# Patient Record
Sex: Male | Born: 1935 | Hispanic: No
Health system: Southern US, Community
[De-identification: ages and names within clinical notes are randomized; demographics above are authoritative.]

## PROBLEM LIST (undated history)

## (undated) DIAGNOSIS — I10 Essential (primary) hypertension: Secondary | ICD-10-CM

## (undated) DIAGNOSIS — N400 Enlarged prostate without lower urinary tract symptoms: Secondary | ICD-10-CM

## (undated) DIAGNOSIS — E785 Hyperlipidemia, unspecified: Secondary | ICD-10-CM

## (undated) DIAGNOSIS — I4891 Unspecified atrial fibrillation: Secondary | ICD-10-CM

## (undated) DIAGNOSIS — H409 Unspecified glaucoma: Secondary | ICD-10-CM

## (undated) DIAGNOSIS — F32A Depression, unspecified: Secondary | ICD-10-CM

## (undated) DIAGNOSIS — K219 Gastro-esophageal reflux disease without esophagitis: Secondary | ICD-10-CM

## (undated) DIAGNOSIS — I482 Chronic atrial fibrillation, unspecified: Secondary | ICD-10-CM

## (undated) DIAGNOSIS — F0390 Unspecified dementia without behavioral disturbance: Secondary | ICD-10-CM

## (undated) HISTORY — DX: Gastro-esophageal reflux disease without esophagitis: K21.9

## (undated) HISTORY — DX: Essential (primary) hypertension: I10

## (undated) HISTORY — PX: OTHER SURGICAL HISTORY: SHX169

## (undated) HISTORY — DX: Unspecified glaucoma: H40.9

## (undated) HISTORY — DX: Benign prostatic hyperplasia without lower urinary tract symptoms: N40.0

## (undated) HISTORY — DX: Depression, unspecified: F32.A

## (undated) HISTORY — DX: Unspecified atrial fibrillation: I48.91

## (undated) HISTORY — PX: NO PAST SURGERIES: SHX2092

---

## 2020-12-02 ENCOUNTER — Encounter: Payer: Self-pay | Admitting: Internal Medicine

## 2020-12-02 ENCOUNTER — Ambulatory Visit (INDEPENDENT_AMBULATORY_CARE_PROVIDER_SITE_OTHER): Payer: Medicare (Managed Care) | Admitting: Internal Medicine

## 2020-12-02 ENCOUNTER — Other Ambulatory Visit: Payer: Self-pay

## 2020-12-02 VITALS — BP 136/87 | HR 100 | Temp 97.5°F | Ht 65.0 in | Wt 141.8 lb

## 2020-12-02 DIAGNOSIS — Z23 Encounter for immunization: Secondary | ICD-10-CM

## 2020-12-02 DIAGNOSIS — I10 Essential (primary) hypertension: Secondary | ICD-10-CM

## 2020-12-02 DIAGNOSIS — H409 Unspecified glaucoma: Secondary | ICD-10-CM | POA: Insufficient documentation

## 2020-12-02 DIAGNOSIS — J3489 Other specified disorders of nose and nasal sinuses: Secondary | ICD-10-CM | POA: Diagnosis not present

## 2020-12-02 DIAGNOSIS — F329 Major depressive disorder, single episode, unspecified: Secondary | ICD-10-CM

## 2020-12-02 DIAGNOSIS — I4891 Unspecified atrial fibrillation: Secondary | ICD-10-CM | POA: Insufficient documentation

## 2020-12-02 DIAGNOSIS — H66002 Acute suppurative otitis media without spontaneous rupture of ear drum, left ear: Secondary | ICD-10-CM

## 2020-12-02 DIAGNOSIS — N400 Enlarged prostate without lower urinary tract symptoms: Secondary | ICD-10-CM | POA: Insufficient documentation

## 2020-12-02 DIAGNOSIS — E785 Hyperlipidemia, unspecified: Secondary | ICD-10-CM

## 2020-12-02 DIAGNOSIS — K219 Gastro-esophageal reflux disease without esophagitis: Secondary | ICD-10-CM | POA: Insufficient documentation

## 2020-12-02 DIAGNOSIS — F32A Depression, unspecified: Secondary | ICD-10-CM | POA: Insufficient documentation

## 2020-12-02 MED ORDER — SPIRONOLACTONE 25 MG PO TABS: 25.0000 mg | ORAL_TABLET | Freq: Every day | ORAL | 0 refills | Status: DC

## 2020-12-02 MED ORDER — PANTOPRAZOLE SODIUM 40 MG PO TBEC: 40.0000 mg | DELAYED_RELEASE_TABLET | Freq: Every day | ORAL | 0 refills | Status: DC

## 2020-12-02 MED ORDER — DILTIAZEM HCL 30 MG PO TABS: 30.0000 mg | ORAL_TABLET | Freq: Two times a day (BID) | ORAL | 0 refills | Status: AC

## 2020-12-02 MED ORDER — DOXYCYCLINE HYCLATE 100 MG PO TABS: 100.0000 mg | ORAL_TABLET | Freq: Two times a day (BID) | ORAL | 0 refills | Status: DC

## 2020-12-02 MED ORDER — DABIGATRAN ETEXILATE MESYLATE 150 MG PO CAPS: 150.0000 mg | ORAL_CAPSULE | Freq: Two times a day (BID) | ORAL | 0 refills | Status: DC

## 2020-12-02 MED ORDER — FINASTERIDE 5 MG PO TABS: 5.0000 mg | ORAL_TABLET | Freq: Every day | ORAL | 0 refills | Status: AC

## 2020-12-02 MED ORDER — ATORVASTATIN CALCIUM 10 MG PO TABS: 10.0000 mg | ORAL_TABLET | Freq: Every day | ORAL | 0 refills | Status: DC

## 2020-12-02 NOTE — Patient Instructions (Signed)
Continue current medications  We are referring you to an eye doctor, ENT doctor and cardiologist  Take the antibiotics for 1 week   GO TO THE LAB : Get the blood work     GO TO THE FRONT DESK, PLEASE SCHEDULE YOUR APPOINTMENTS Come back for   a checkup in 4 weeks

## 2020-12-02 NOTE — Progress Notes (Signed)
Subjective:    Patient ID: Mark Moore, male    DOB: 1928/09/25, 87 y.o.   MRN: [id number]  DOS:  12/02/2020 Type of visit - description:  New patient, referred by his daughter who is my patient.  Here with his son-in-law.  The patient is 87, lost his wife in September 2021. Since then, he deteriorated rapidly, main concern was decreased mobility and dysphagia. Was admitted twice in [place] where he resided. Eventually was discharged late December to a assisted living facility. The patient moved to [place] 4 days ago and brought to his daughter's house.  One of the main concerns of the family is depression.  Pt was  married for more than 60 years. The patient said today that he in a  way feels better now that is with his daughter and her family.  He also has a left ear infection, chronic, for at least 6 weeks, was seen ENT MD in [place]  Also as soon as the patient arrived, they noted a suppurative lesion at the nose    Review of Systems Currently, no fever chills No nausea, vomiting, diarrhea.  No blood in the stools Denies chest pain, difficulty breathing, palpitation or edema. No short memory problems noted by the family. Appetite has improved, he is sleeping well. No suicidal ideas   Past Medical History:  Diagnosis Date  . Depression   . Glaucoma   . Hypertension     History reviewed. No pertinent surgical history.  Allergies as of 12/02/2020   No Known Allergies     Medication List    as of Dec 02, 2020  1:42 PM   You have not been prescribed any medications.        Objective:   Physical Exam BP 136/87 (BP Location: Left Arm, Patient Position: Sitting, Cuff Size: Large)   Pulse 100   Temp (!) 97.5 F (36.4 C) (Temporal)   Ht 5\' 5"  (1.651 m)   Wt 141 lb 12.8 oz (64.3 kg)   SpO2 98%   BMI 23.60 kg/m  General:   Well developed, NAD, BMI noted.  HEENT:  Normocephalic . Face symmetric, atraumatic Nose: + Swelling, redness and a  punch like lesion at the left side of the nose.  See picture.  I was able to get 1/2 mL of purulent material L ear: Discharge noted. Lungs:  CTA B Normal respiratory effort, no intercostal retractions, no accessory muscle use. Heart: Irregularly irregular.  Abdomen:  Not distended, soft, non-tender. No rebound or rigidity.   Skin: Not pale. Not jaundice Lower extremities: +/+++ pretibial edema bilaterally  Neurologic:  alert & oriented X3.  Speech normal, gait appropriate for age and unassisted Psych--  Cognition and judgment appear intact.  Cooperative with normal attention span and concentration.  Behavior appropriate. No anxious or depressed appearing.       Assessment    Assessment  New patient, referred by daughter   High cholesterol Hypokalemia severe , h/o  BPH GERD Glaucoma Depression (after lost wife 04/2020) HOH  PLAN Social: Patient moved from 05/2020 85 days ago to live with his family, he lost his wife of more than 60 years, September 2021. Since then he apparently had failure to thrive, was admitted to hospital x 2 and subsequently moved to an assisted living facility in October 2021 until few days ago. He presents here for general care. Atrial fibrillation: Unknown to the patient and the family, he has atrial fibrillation, I  noticed that he takes anticoagulants and Cardizem.  Currently asymptomatic.  I did notice some swelling of the lower extremities. EKG: A. fib. Plan: Continue present care, nonurgent referral to cardiology. High cholesterol: On Lipitor Hypokalemia, severe, h/o: reportedly many years ago he "almost died" from hypokalemia and since then is taking Aldactone. Suppurativa lesions: of the Nose, left ear: Doxycycline, refer to ENT. Depression: Due to the recent loss of his wife, denies strongly any suicidal ideas, currently living with his daughter family and feels somewhat better. Denies history of diabetes, stroke or previous  CAD. Medical records: Will attempt to get medical records from [place] Labs: CMP, CBC, TSH, A1c Also, family strongly request PNM shot, PNM 23 provided today. RTC 4 weeks    This visit occurred during the SARS-CoV-2 public health emergency.  Safety protocols were in place, including screening questions prior to the visit, additional usage of staff PPE, and extensive cleaning of exam room while observing appropriate contact time as indicated for disinfecting solutions.

## 2020-12-03 ENCOUNTER — Telehealth: Payer: Self-pay

## 2020-12-03 DIAGNOSIS — E785 Hyperlipidemia, unspecified: Secondary | ICD-10-CM | POA: Insufficient documentation

## 2020-12-03 DIAGNOSIS — Z0279 Encounter for issue of other medical certificate: Secondary | ICD-10-CM

## 2020-12-03 LAB — COMPREHENSIVE METABOLIC PANEL
ALT: 11 U/L (ref 0–53)
AST: 13 U/L (ref 0–37)
Albumin: 4.1 g/dL (ref 3.5–5.2)
Alkaline Phosphatase: 95 U/L (ref 39–117)
BUN: 30 mg/dL — ABNORMAL HIGH (ref 6–23)
CO2: 27 mEq/L (ref 19–32)
Calcium: 9.9 mg/dL (ref 8.4–10.5)
Chloride: 104 mEq/L (ref 96–112)
Creatinine, Ser: 1.03 mg/dL (ref 0.40–1.50)
GFR: 64.45 mL/min (ref >60.00)
Glucose, Bld: 95 mg/dL (ref 70–99)
Potassium: 4.5 mEq/L (ref 3.5–5.1)
Sodium: 140 mEq/L (ref 135–145)
Total Bilirubin: 0.6 mg/dL (ref 0.2–1.2)
Total Protein: 6.9 g/dL (ref 6.0–8.3)

## 2020-12-03 LAB — CBC WITH DIFFERENTIAL/PLATELET
Basophils Absolute: 0.1 10*3/uL (ref 0.0–0.1)
Basophils Relative: 1 % (ref 0.0–3.0)
Eosinophils Absolute: 0.1 10*3/uL (ref 0.0–0.7)
Eosinophils Relative: 1.5 % (ref 0.0–5.0)
HCT: 34.5 % — ABNORMAL LOW (ref 39.0–52.0)
Hemoglobin: 11.6 g/dL — ABNORMAL LOW (ref 13.0–17.0)
Lymphocytes Relative: 19.6 % (ref 12.0–46.0)
Lymphs Abs: 1.7 10*3/uL (ref 0.7–4.0)
MCHC: 33.6 g/dL (ref 30.0–36.0)
MCV: 97.3 fl (ref 78.0–100.0)
Monocytes Absolute: 0.8 10*3/uL (ref 0.1–1.0)
Monocytes Relative: 9.3 % (ref 3.0–12.0)
Neutro Abs: 6.1 10*3/uL (ref 1.4–7.7)
Neutrophils Relative %: 68.6 % (ref 43.0–77.0)
Platelets: 319 10*3/uL (ref 150.0–400.0)
RBC: 3.55 Mil/uL — ABNORMAL LOW (ref 4.22–5.81)
RDW: 14.8 % (ref 11.5–15.5)
WBC: 8.9 10*3/uL (ref 4.0–10.5)

## 2020-12-03 LAB — TSH: TSH: 1.54 u[IU]/mL (ref 0.35–4.50)

## 2020-12-03 LAB — HEMOGLOBIN A1C: Hgb A1c MFr Bld: 5.3 % (ref 4.6–6.5)

## 2020-12-03 MED ORDER — DORZOLAMIDE HCL 2 % OP SOLN: 1.0000 [drp] | Freq: Two times a day (BID) | OPHTHALMIC | 0 refills | Status: DC

## 2020-12-03 MED ORDER — BRIMONIDINE TARTRATE 0.2 % OP SOLN: 1.0000 [drp] | Freq: Three times a day (TID) | OPHTHALMIC | 0 refills | Status: DC

## 2020-12-03 NOTE — Telephone Encounter (Signed)
FL2 form completed over the phone with Pt's daughter, Murlean Caller, they are trying to get him in at [facility] for assisted living. Copy of form sent for scanning and original placed at front desk for pick up by Ana at her convenience.

## 2020-12-03 NOTE — Telephone Encounter (Signed)
FL2 completed. Ana aware it is ready for pick up at front desk.

## 2020-12-29 ENCOUNTER — Telehealth: Payer: Self-pay | Admitting: Internal Medicine

## 2020-12-29 NOTE — Telephone Encounter (Signed)
Caller: Deidre Frances Furbish) Call back # [phone]  Verbal order  PT & OT evaluation  Ok to leave msg

## 2020-12-29 NOTE — Telephone Encounter (Signed)
Spoke w Deidre- verbal orders given. Requesting most recent OV note faxed to them at [phone].

## 2020-12-31 MED ORDER — ESCITALOPRAM OXALATE 5 MG PO TABS
5.0000 mg | ORAL_TABLET | Freq: Every day | ORAL | 1 refills | Status: DC
Start: 2020-12-31 — End: 2021-03-12

## 2021-01-01 ENCOUNTER — Ambulatory Visit: Payer: Medicare (Managed Care) | Admitting: Internal Medicine

## 2021-01-08 ENCOUNTER — Other Ambulatory Visit: Payer: Self-pay | Admitting: Internal Medicine

## 2021-01-17 ENCOUNTER — Encounter: Payer: Self-pay | Admitting: Internal Medicine

## 2021-01-17 ENCOUNTER — Emergency Department (HOSPITAL_COMMUNITY): Payer: Medicare (Managed Care)

## 2021-01-17 ENCOUNTER — Other Ambulatory Visit: Payer: Self-pay

## 2021-01-17 ENCOUNTER — Encounter (HOSPITAL_COMMUNITY): Payer: Self-pay | Admitting: Emergency Medicine

## 2021-01-17 ENCOUNTER — Inpatient Hospital Stay (HOSPITAL_COMMUNITY)
Admission: EM | Admit: 2021-01-17 | Discharge: 2021-02-01 | DRG: 689 | Disposition: A | Discharge status: Skilled Nursing Facility | Payer: Medicare (Managed Care) | Source: Skilled Nursing Facility | Attending: Internal Medicine | Admitting: Internal Medicine

## 2021-01-17 DIAGNOSIS — R4182 Altered mental status, unspecified: Secondary | ICD-10-CM

## 2021-01-17 DIAGNOSIS — W19XXXA Unspecified fall, initial encounter: Secondary | ICD-10-CM

## 2021-01-17 DIAGNOSIS — N390 Urinary tract infection, site not specified: Secondary | ICD-10-CM | POA: Diagnosis not present

## 2021-01-17 DIAGNOSIS — F32A Depression, unspecified: Secondary | ICD-10-CM | POA: Diagnosis present

## 2021-01-17 DIAGNOSIS — S0181XA Laceration without foreign body of other part of head, initial encounter: Secondary | ICD-10-CM | POA: Diagnosis present

## 2021-01-17 DIAGNOSIS — W101XXA Fall (on)(from) sidewalk curb, initial encounter: Secondary | ICD-10-CM | POA: Diagnosis not present

## 2021-01-17 DIAGNOSIS — I482 Chronic atrial fibrillation, unspecified: Secondary | ICD-10-CM | POA: Insufficient documentation

## 2021-01-17 DIAGNOSIS — S22080A Wedge compression fracture of T11-T12 vertebra, initial encounter for closed fracture: Secondary | ICD-10-CM

## 2021-01-17 DIAGNOSIS — M4854XA Collapsed vertebra, not elsewhere classified, thoracic region, initial encounter for fracture: Secondary | ICD-10-CM | POA: Diagnosis present

## 2021-01-17 DIAGNOSIS — G9341 Metabolic encephalopathy: Secondary | ICD-10-CM | POA: Diagnosis present

## 2021-01-17 DIAGNOSIS — Y92480 Sidewalk as the place of occurrence of the external cause: Secondary | ICD-10-CM

## 2021-01-17 DIAGNOSIS — B961 Klebsiella pneumoniae [K. pneumoniae] as the cause of diseases classified elsewhere: Secondary | ICD-10-CM | POA: Diagnosis present

## 2021-01-17 DIAGNOSIS — E785 Hyperlipidemia, unspecified: Secondary | ICD-10-CM | POA: Diagnosis present

## 2021-01-17 DIAGNOSIS — Z20822 Contact with and (suspected) exposure to covid-19: Secondary | ICD-10-CM | POA: Diagnosis present

## 2021-01-17 DIAGNOSIS — S22000A Wedge compression fracture of unspecified thoracic vertebra, initial encounter for closed fracture: Secondary | ICD-10-CM

## 2021-01-17 DIAGNOSIS — Z66 Do not resuscitate: Secondary | ICD-10-CM

## 2021-01-17 DIAGNOSIS — Z1612 Extended spectrum beta lactamase (ESBL) resistance: Secondary | ICD-10-CM | POA: Diagnosis present

## 2021-01-17 DIAGNOSIS — I480 Paroxysmal atrial fibrillation: Secondary | ICD-10-CM | POA: Diagnosis present

## 2021-01-17 DIAGNOSIS — Z23 Encounter for immunization: Secondary | ICD-10-CM

## 2021-01-17 DIAGNOSIS — W010XXA Fall on same level from slipping, tripping and stumbling without subsequent striking against object, initial encounter: Secondary | ICD-10-CM | POA: Diagnosis present

## 2021-01-17 DIAGNOSIS — E876 Hypokalemia: Secondary | ICD-10-CM | POA: Diagnosis present

## 2021-01-17 DIAGNOSIS — G9340 Encephalopathy, unspecified: Secondary | ICD-10-CM | POA: Diagnosis present

## 2021-01-17 DIAGNOSIS — F0390 Unspecified dementia without behavioral disturbance: Secondary | ICD-10-CM | POA: Diagnosis present

## 2021-01-17 DIAGNOSIS — R4189 Other symptoms and signs involving cognitive functions and awareness: Secondary | ICD-10-CM | POA: Insufficient documentation

## 2021-01-17 DIAGNOSIS — H409 Unspecified glaucoma: Secondary | ICD-10-CM | POA: Diagnosis present

## 2021-01-17 DIAGNOSIS — T1490XA Injury, unspecified, initial encounter: Secondary | ICD-10-CM | POA: Diagnosis not present

## 2021-01-17 HISTORY — DX: Do not resuscitate: Z66

## 2021-01-17 HISTORY — DX: Hyperlipidemia, unspecified: E78.5

## 2021-01-17 HISTORY — DX: Chronic atrial fibrillation, unspecified: I48.20

## 2021-01-17 HISTORY — DX: Unspecified dementia, unspecified severity, without behavioral disturbance, psychotic disturbance, mood disturbance, and anxiety: F03.90

## 2021-01-17 HISTORY — DX: Wedge compression fracture of unspecified thoracic vertebra, initial encounter for closed fracture: S22.000A

## 2021-01-17 LAB — COMPREHENSIVE METABOLIC PANEL
ALT: 24 U/L (ref 0–44)
AST: 40 U/L (ref 15–41)
Albumin: 3.9 g/dL (ref 3.5–5.0)
Alkaline Phosphatase: 67 U/L (ref 38–126)
Anion gap: 12 (ref 5–15)
BUN: 48 mg/dL — ABNORMAL HIGH (ref 8–23)
CO2: 20 mmol/L — ABNORMAL LOW (ref 22–32)
Calcium: 9.6 mg/dL (ref 8.9–10.3)
Chloride: 105 mmol/L (ref 98–111)
Creatinine, Ser: 1.44 mg/dL — ABNORMAL HIGH (ref 0.61–1.24)
GFR, Estimated: 46 mL/min — ABNORMAL LOW (ref >60)
Glucose, Bld: 104 mg/dL — ABNORMAL HIGH (ref 70–99)
Potassium: 3.9 mmol/L (ref 3.5–5.1)
Sodium: 137 mmol/L (ref 135–145)
Total Bilirubin: 1 mg/dL (ref 0.3–1.2)
Total Protein: 6.1 g/dL — ABNORMAL LOW (ref 6.5–8.1)

## 2021-01-17 LAB — I-STAT CHEM 8, ED
BUN: 53 mg/dL — ABNORMAL HIGH (ref 8–23)
Calcium, Ion: 1.11 mmol/L — ABNORMAL LOW (ref 1.15–1.40)
Chloride: 106 mmol/L (ref 98–111)
Creatinine, Ser: 1.4 mg/dL — ABNORMAL HIGH (ref 0.61–1.24)
Glucose, Bld: 103 mg/dL — ABNORMAL HIGH (ref 70–99)
HCT: 36 % — ABNORMAL LOW (ref 39.0–52.0)
Hemoglobin: 12.2 g/dL — ABNORMAL LOW (ref 13.0–17.0)
Potassium: 3.7 mmol/L (ref 3.5–5.1)
Sodium: 138 mmol/L (ref 135–145)
TCO2: 21 mmol/L — ABNORMAL LOW (ref 22–32)

## 2021-01-17 LAB — CBC
HCT: 37.2 % — ABNORMAL LOW (ref 39.0–52.0)
Hemoglobin: 12.2 g/dL — ABNORMAL LOW (ref 13.0–17.0)
MCH: 32.4 pg (ref 26.0–34.0)
MCHC: 32.8 g/dL (ref 30.0–36.0)
MCV: 98.9 fL (ref 80.0–100.0)
Platelets: 276 10*3/uL (ref 150–400)
RBC: 3.76 MIL/uL — ABNORMAL LOW (ref 4.22–5.81)
RDW: 15.2 % (ref 11.5–15.5)
WBC: 8.2 10*3/uL (ref 4.0–10.5)
nRBC: 0 % (ref 0.0–0.2)

## 2021-01-17 LAB — URINALYSIS, ROUTINE W REFLEX MICROSCOPIC
Bilirubin Urine: NEGATIVE
Glucose, UA: NEGATIVE mg/dL
Ketones, ur: NEGATIVE mg/dL
Nitrite: NEGATIVE
Protein, ur: NEGATIVE mg/dL
Specific Gravity, Urine: 1.01 (ref 1.005–1.030)
pH: 5.5 (ref 5.0–8.0)

## 2021-01-17 LAB — LACTIC ACID, PLASMA: Lactic Acid, Venous: 3.8 mmol/L (ref 0.5–1.9)

## 2021-01-17 LAB — URINALYSIS, MICROSCOPIC (REFLEX)

## 2021-01-17 LAB — PROTIME-INR
INR: 1.6 — ABNORMAL HIGH (ref 0.8–1.2)
Prothrombin Time: 18.8 seconds — ABNORMAL HIGH (ref 11.4–15.2)

## 2021-01-17 LAB — RESP PANEL BY RT-PCR (FLU A&B, COVID) ARPGX2
Influenza A by PCR: NEGATIVE
Influenza B by PCR: NEGATIVE
SARS Coronavirus 2 by RT PCR: NEGATIVE

## 2021-01-17 LAB — CK: Total CK: 107 U/L (ref 49–397)

## 2021-01-17 LAB — SAMPLE TO BLOOD BANK

## 2021-01-17 LAB — ETHANOL: Alcohol, Ethyl (B): <10 mg/dL (ref <10)

## 2021-01-17 MED ORDER — DILTIAZEM HCL 30 MG PO TABS
30.0000 mg | ORAL_TABLET | Freq: Two times a day (BID) | ORAL | Status: DC
  Administered 2021-01-17 – 2021-02-01 (×30): 30 mg via ORAL
  Filled 2021-01-17 (×30): qty 1

## 2021-01-17 MED ORDER — ACETAMINOPHEN 650 MG RE SUPP: 650.0000 mg | Freq: Four times a day (QID) | RECTAL | Status: DC | PRN

## 2021-01-17 MED ORDER — SODIUM CHLORIDE 0.9 % IV BOLUS
125.0000 mL | Freq: Once | INTRAVENOUS | Status: AC
  Administered 2021-01-17: 125 mL via INTRAVENOUS

## 2021-01-17 MED ORDER — ATORVASTATIN CALCIUM 10 MG PO TABS
10.0000 mg | ORAL_TABLET | Freq: Every day | ORAL | Status: DC
  Administered 2021-01-18 – 2021-02-01 (×15): 10 mg via ORAL
  Filled 2021-01-17 (×15): qty 1

## 2021-01-17 MED ORDER — BRIMONIDINE TARTRATE 0.2 % OP SOLN
1.0000 [drp] | Freq: Three times a day (TID) | OPHTHALMIC | Status: DC
  Administered 2021-01-17 – 2021-02-01 (×42): 1 [drp] via OPHTHALMIC
  Filled 2021-01-17 (×3): qty 5

## 2021-01-17 MED ORDER — DOCUSATE SODIUM 100 MG PO CAPS
100.0000 mg | ORAL_CAPSULE | Freq: Two times a day (BID) | ORAL | Status: DC
  Administered 2021-01-17 – 2021-02-01 (×24): 100 mg via ORAL
  Filled 2021-01-17 (×25): qty 1

## 2021-01-17 MED ORDER — HYDROCODONE-ACETAMINOPHEN 5-325 MG PO TABS: 1.0000 | ORAL_TABLET | ORAL | Status: DC | PRN

## 2021-01-17 MED ORDER — TETANUS-DIPHTH-ACELL PERTUSSIS 5-2.5-18.5 LF-MCG/0.5 IM SUSY
0.5000 mL | PREFILLED_SYRINGE | Freq: Once | INTRAMUSCULAR | Status: AC
  Administered 2021-01-17: 0.5 mL via INTRAMUSCULAR
  Filled 2021-01-17: qty 0.5

## 2021-01-17 MED ORDER — LACTATED RINGERS IV SOLN: INTRAVENOUS | Status: DC

## 2021-01-17 MED ORDER — ACETAMINOPHEN 325 MG PO TABS
650.0000 mg | ORAL_TABLET | Freq: Four times a day (QID) | ORAL | Status: DC | PRN
  Administered 2021-01-19 – 2021-01-26 (×3): 650 mg via ORAL
  Filled 2021-01-17 (×3): qty 2

## 2021-01-17 MED ORDER — MORPHINE SULFATE (PF) 2 MG/ML IV SOLN
2.0000 mg | INTRAVENOUS | Status: DC | PRN
  Administered 2021-01-18: 2 mg via INTRAVENOUS
  Filled 2021-01-17: qty 1

## 2021-01-17 MED ORDER — HALOPERIDOL LACTATE 5 MG/ML IJ SOLN
2.0000 mg | Freq: Four times a day (QID) | INTRAMUSCULAR | Status: DC | PRN
  Administered 2021-01-18 – 2021-01-20 (×2): 2 mg via INTRAVENOUS
  Filled 2021-01-17 (×3): qty 1

## 2021-01-17 MED ORDER — HYDRALAZINE HCL 20 MG/ML IJ SOLN: 5.0000 mg | INTRAMUSCULAR | Status: DC | PRN

## 2021-01-17 MED ORDER — POLYETHYLENE GLYCOL 3350 17 G PO PACK: 17.0000 g | PACK | Freq: Every day | ORAL | Status: DC | PRN

## 2021-01-17 MED ORDER — PANTOPRAZOLE SODIUM 40 MG PO TBEC
40.0000 mg | DELAYED_RELEASE_TABLET | Freq: Every day | ORAL | Status: DC
  Administered 2021-01-18 – 2021-02-01 (×15): 40 mg via ORAL
  Filled 2021-01-17 (×15): qty 1

## 2021-01-17 MED ORDER — BISACODYL 5 MG PO TBEC: 5.0000 mg | DELAYED_RELEASE_TABLET | Freq: Every day | ORAL | Status: DC | PRN

## 2021-01-17 MED ORDER — FINASTERIDE 5 MG PO TABS
5.0000 mg | ORAL_TABLET | Freq: Every day | ORAL | Status: DC
  Administered 2021-01-18 – 2021-02-01 (×15): 5 mg via ORAL
  Filled 2021-01-17 (×15): qty 1

## 2021-01-17 MED ORDER — DABIGATRAN ETEXILATE MESYLATE 150 MG PO CAPS
150.0000 mg | ORAL_CAPSULE | Freq: Two times a day (BID) | ORAL | Status: DC
  Administered 2021-01-17: 150 mg via ORAL
  Filled 2021-01-17 (×2): qty 1

## 2021-01-17 MED ORDER — ONDANSETRON HCL 4 MG/2ML IJ SOLN: 4.0000 mg | Freq: Four times a day (QID) | INTRAMUSCULAR | Status: DC | PRN

## 2021-01-17 MED ORDER — IOHEXOL 300 MG/ML  SOLN
100.0000 mL | Freq: Once | INTRAMUSCULAR | Status: AC | PRN
  Administered 2021-01-17: 100 mL via INTRAVENOUS

## 2021-01-17 MED ORDER — ONDANSETRON HCL 4 MG PO TABS: 4.0000 mg | ORAL_TABLET | Freq: Four times a day (QID) | ORAL | Status: DC | PRN

## 2021-01-17 MED ORDER — DORZOLAMIDE HCL 2 % OP SOLN
1.0000 [drp] | Freq: Two times a day (BID) | OPHTHALMIC | Status: DC
  Administered 2021-01-17 – 2021-02-01 (×31): 1 [drp] via OPHTHALMIC
  Filled 2021-01-17 (×3): qty 10

## 2021-01-17 NOTE — ED Provider Notes (Signed)
[facility] EMERGENCY DEPARTMENT Provider Note   CSN: [id number] Arrival date & time: 01/17/21  4888     History Level 2 trauma  Luc Shammas is a 87 y.o. male.  HPI  Patient presented to the ED for evaluation after being found down lying outside of the nursing facility.  History is somewhat limited but it sounds like the patient is a resident of the assisted living facility portion.  He was found outside the skilled nursing facility.  Patient was only partially closed.  He had evidence of injuries to his head and his extremities.  Patient was confused but also does not speak [place], EMS was not able to provide much of a history.    The daughter was contacted and she was able to provide some additional history.  Patient has been having increasing confusion over this past week.  I thought it could be related to a urinary tract infection but he also started on a new medication Wellbutrin  Patient was spoken to in Spanish by nursing staff.  Patient is confused and not able to provide meaningful history History reviewed. No pertinent past medical history.  There are no problems to display for this patient.   History reviewed. No pertinent surgical history.     History reviewed. No pertinent family history.     Home Medications Prior to Admission medications   Not on File    Allergies    Patient has no allergy information on record.  Review of Systems   Review of Systems  Unable to perform ROS: Acuity of condition   Physical Exam Updated Vital Signs BP 118/70   Pulse 82   Temp 97.6 F (36.4 C) (Oral)   Resp 16   Ht 1.753 m (5\' 9" )   Wt 81.6 kg   SpO2 96%   BMI 26.58 kg/m   Physical Exam Vitals and nursing note reviewed.  Constitutional:      Appearance: Normal appearance. He is well-developed. He is not diaphoretic.  HENT:     Head: Normocephalic. No raccoon eyes or Battle's sign.     Comments: Laceration/abrasion above the right  eye    Right Ear: External ear normal.     Left Ear: External ear normal.  Eyes:     General: Lids are normal.        Right eye: No discharge.     Conjunctiva/sclera:     Right eye: No hemorrhage.    Left eye: No hemorrhage. Neck:     Trachea: No tracheal deviation.  Cardiovascular:     Rate and Rhythm: Normal rate and regular rhythm.     Heart sounds: Normal heart sounds.  Pulmonary:     Effort: Pulmonary effort is normal. No respiratory distress.     Breath sounds: Normal breath sounds. No stridor.  Chest:     Chest wall: No tenderness.  Abdominal:     General: Bowel sounds are normal. There is no distension.     Palpations: Abdomen is soft. There is no mass.     Tenderness: There is no abdominal tenderness.     Comments: Negative for seat belt sign  Musculoskeletal:        General: Tenderness present.     Cervical back: No swelling, edema, deformity or tenderness. No spinous process tenderness.     Thoracic back: No swelling, deformity or tenderness.     Lumbar back: No swelling or tenderness.     Right lower leg: Edema present.  Left lower leg: Edema present.     Comments: Pelvis stable, no ttp; abrasions and contusions to the bilateral knees, tenderness palpation  Neurological:     Mental Status: He is alert.     GCS: GCS eye subscore is 4. GCS verbal subscore is 5. GCS motor subscore is 6.     Motor: No abnormal muscle tone.     Comments: Does not follow commands, awake, answers questions, will need to obtain translator  Psychiatric:        Mood and Affect: Mood normal.        Speech: Speech normal.        Behavior: Behavior normal.    ED Results / Procedures / Treatments   Labs (all labs ordered are listed, but only abnormal results are displayed) Labs Reviewed  COMPREHENSIVE METABOLIC PANEL - Abnormal; Notable for the following components:      Result Value   CO2 20 (*)    Glucose, Bld 104 (*)    BUN 48 (*)    Creatinine, Ser 1.44 (*)    Total Protein  6.1 (*)    GFR, Estimated 46 (*)    All other components within normal limits  CBC - Abnormal; Notable for the following components:   RBC 3.76 (*)    Hemoglobin 12.2 (*)    HCT 37.2 (*)    All other components within normal limits  LACTIC ACID, PLASMA - Abnormal; Notable for the following components:   Lactic Acid, Venous 3.8 (*)    All other components within normal limits  PROTIME-INR - Abnormal; Notable for the following components:   Prothrombin Time 18.8 (*)    INR 1.6 (*)    All other components within normal limits  I-STAT CHEM 8, ED - Abnormal; Notable for the following components:   BUN 53 (*)    Creatinine, Ser 1.40 (*)    Glucose, Bld 103 (*)    Calcium, Ion 1.11 (*)    TCO2 21 (*)    Hemoglobin 12.2 (*)    HCT 36.0 (*)    All other components within normal limits  RESP PANEL BY RT-PCR (FLU A&B, COVID) ARPGX2  ETHANOL  CK  URINALYSIS, ROUTINE W REFLEX MICROSCOPIC  SAMPLE TO BLOOD BANK    EKG None  Radiology DG Knee 2 Views Left  Result Date: 01/17/2021 CLINICAL DATA:  Pain post fall. EXAM: LEFT KNEE - 1-2 VIEW COMPARISON:  None. FINDINGS: No evidence of fracture, dislocation, or joint effusion. No evidence of arthropathy or other focal bone abnormality. Mild suprapatellar soft tissue edema. IMPRESSION: Negative. Electronically Signed   By: Ted Mcalpine M.D.   On: 01/17/2021 11:02   DG Knee 2 Views Right  Result Date: 01/17/2021 CLINICAL DATA:  Patient status post fall. EXAM: RIGHT KNEE - 1-2 VIEW COMPARISON:  None. FINDINGS: Normal anatomic alignment. No evidence for acute fracture or dislocation. Regional soft tissues unremarkable. Vascular calcifications. IMPRESSION: No acute osseous abnormality. Electronically Signed   By: Annia Belt M.D.   On: 01/17/2021 11:03   CT HEAD WO CONTRAST  Result Date: 01/17/2021 CLINICAL DATA:  87 year old male found down. On anticoagulation. EXAM: CT HEAD WITHOUT CONTRAST TECHNIQUE: Contiguous axial images were obtained  from the base of the skull through the vertex without intravenous contrast. COMPARISON:  None. FINDINGS: Brain: No midline shift, ventriculomegaly, mass effect, evidence of mass lesion, intracranial hemorrhage or evidence of cortically based acute infarction. Patchy and confluent fairly symmetric bilateral cerebral white matter hypodensity. Small area of  chronic appearing cortical encephalomalacia also in the anterior right temporal lobe tip (sagittal image 17). Elsewhere preserved gray-white matter differentiation. No other cortical encephalomalacia identified. Vascular: Calcified atherosclerosis at the skull base. No suspicious intracranial vascular hyperdensity. Skull: No fracture identified. Sinuses/Orbits: Left middle ear and mastoids are opacified. And there is chronic right posterior ethmoid and bilateral sphenoid sinus disease with mucoperiosteal thickening. Other sinuses, right tympanic cavity and mastoids are clear. Other: Right forehead scalp hematoma is broad-based, 6 mm in thickness. Visible orbits soft tissues appear to remain normal. No scalp soft tissue gas identified. IMPRESSION: 1. Right forehead scalp hematoma without underlying skull fracture. 2. No acute intracranial abnormality. Moderately advanced bilateral white matter changes most commonly due to small vessel disease. Chronic encephalomalacia at the right temporal lobe tip, might be sequelae of remote trauma. 3. Chronic paranasal sinus disease. Left middle ear and mastoid opacification are likely inflammatory, such as otitis media or cholesteatoma. Electronically Signed   By: Odessa Fleming M.D.   On: 01/17/2021 08:55   CT CERVICAL SPINE WO CONTRAST  Result Date: 01/17/2021 CLINICAL DATA:  87 year old male found down. On anticoagulation. EXAM: CT CERVICAL SPINE WITHOUT CONTRAST TECHNIQUE: Multidetector CT imaging of the cervical spine was performed without intravenous contrast. Multiplanar CT image reconstructions were also generated.  COMPARISON:  Head CT today. FINDINGS: Alignment: Mild straightening of cervical lordosis. Cervicothoracic junction alignment is within normal limits. Bilateral posterior element alignment is within normal limits. Skull base and vertebrae: Visualized skull base is intact. No atlanto-occipital dissociation. Maintained C1-C2 alignment, in those levels appear intact. No acute osseous abnormality identified. Soft tissues and spinal canal: No prevertebral fluid or swelling. No visible canal hematoma. Bulky left carotid calcified atherosclerosis in the neck. Disc levels: Upper cervical facet degeneration, possible developing facet ankylosis on the right at C2-C3. Widespread cervical disc bulging and mild endplate spurring. Suspect mild degenerative spinal stenosis at C4-C5 when combined with ligament flavum hypertrophy. Upper chest: Visible upper thoracic levels appear intact. Negative lung apices. Partially visible calcified aortic arch atherosclerosis. Small volume retained secretions along the left wall of the trachea at the thoracic inlet. Other: Negative visible posterior fossa. IMPRESSION: 1. No acute traumatic injury identified in the cervical spine. 2. Cervical spine degeneration with suspected mild spinal stenosis at C4-C5. 3. Calcified left carotid atherosclerosis in the neck. 4. Aortic Atherosclerosis (ICD10-I70.0). Electronically Signed   By: Odessa Fleming M.D.   On: 01/17/2021 09:22   CT CHEST ABDOMEN PELVIS W CONTRAST  Result Date: 01/17/2021 CLINICAL DATA:  87 year old male found down.  On anticoagulation. EXAM: CT CHEST, ABDOMEN, AND PELVIS WITH CONTRAST TECHNIQUE: Multidetector CT imaging of the chest, abdomen and pelvis was performed following the standard protocol during bolus administration of intravenous contrast. CONTRAST:  OMNIPAQUE IOHEXOL 300 MG/ML  SOLN COMPARISON:  Portable chest x-ray today. FINDINGS: CT CHEST FINDINGS Cardiovascular: Tortuous thoracic aorta with calcified atherosclerosis.  Extensive coronary artery calcified plaque or stents. Major vascular structures in the chest appear intact. Central pulmonary arteries appear patent. Cardiomegaly with no pericardial effusion. Mediastinum/Nodes: Negative. No mediastinal hematoma or lymphadenopathy. Lungs/Pleura: Trace retained secretions in the trachea below the thoracic inlet. Otherwise the major airways are patent. Mild dependent opacity in the lungs, greater on the left, most resembles atelectasis. No pneumothorax or pleural effusion. Superimposed mild right middle lobe lateral segment scarring including a small triangular nodule in the minor fissure which is probably an intrapleural lymph node (series 4, image 43). No pneumothorax. No pleural effusion. No convincing pulmonary contusion. Musculoskeletal:  T12 superior endplate compression fracture appears acute with subchondral lucency. The T12 pedicles and posterior elements appear intact. There is no significant retropulsion. 35% loss of height. Other thoracic vertebrae appear intact. Intact sternum. Visible shoulder osseous structures appear intact. Chronic appearing posterior left 8th and 9th rib fractures. No convincing acute rib fracture. CT ABDOMEN PELVIS FINDINGS Hepatobiliary: Small circumscribed low-density area in the central liver on series 4, image 47 does not appear traumatic and is probably a benign cyst. Elsewhere the liver and gallbladder are within normal limits. Pancreas: Negative. Spleen: The spleen appears intact.  No perisplenic fluid. Adrenals/Urinary Tract: Normal adrenal glands. Symmetric renal enhancement and contrast excretion. Benign left renal parapelvic cyst. Left midpole nephrolithiasis. Decompressed proximal ureters. Unremarkable bladder. Numerous pelvic phleboliths. Stomach/Bowel: Gas and liquid stool in the rectum. Redundant sigmoid colon which tracks into the right upper quadrant. Redundant transverse colon and flexures containing gas. Retained stool in the right  colon. And the cecum is on a lax mesentery in the mid abdomen. No large bowel inflammation. Appendix not delineated. No dilated small bowel. Decompressed stomach and duodenum. No free air or free fluid identified. Vascular/Lymphatic: Aortoiliac soft and calcified atherosclerosis. Tortuous infrarenal abdominal aorta. Major arterial structures remain patent. Portal venous timing is early. On delayed images the portal venous system appears to be patent. No lymphadenopathy. Reproductive: Negative. Other: No pelvic free fluid. Musculoskeletal: L4 superior endplate compression fracture with 35% loss of vertebral body height. L4 pedicles and posterior elements appear intact. No significant retropulsion. This appears age indeterminate. Superimposed grade 1 spondylolisthesis of L4 on L5. Other lumbar levels appear intact. Sacrum, SI joints, pelvis and proximal femurs are intact. IMPRESSION: 1. Acute T12 compression fractures with 35% loss of height. No significant retropulsion or complicating features. 2. Age indeterminate L4 compression fracture with similar loss of height, no retropulsion. Underlying chronic degenerative appearing grade 1 spondylolisthesis at L4-L5. 3. No other acute traumatic injury identified in the chest, abdomen, or pelvis. 4. Cardiomegaly. Extensive coronary artery and Aortic Atherosclerosis (ICD10-I70.0). Left nephrolithiasis. Redundant large bowel. Electronically Signed   By: Odessa Fleming M.D.   On: 01/17/2021 09:08   DG Chest Port 1 View  Result Date: 01/17/2021 CLINICAL DATA:  Male of unknown age status post fall. Found down. On anticoagulation. EXAM: PORTABLE CHEST 1 VIEW COMPARISON:  None. FINDINGS: Portable AP supine view at 0717 hours. Mildly tortuous thoracic aorta with calcified atherosclerosis. Other mediastinal contours are within normal limits. Visualized tracheal air column is within normal limits. Allowing for portable technique the lungs are clear. No pneumothorax or pleural effusion  identified on this supine view. Abundant gas-filled but nondilated bowel loops in the upper abdomen. No displaced rib fracture identified. No acute osseous abnormality identified. IMPRESSION: No acute cardiopulmonary abnormality or acute traumatic injury identified. Electronically Signed   By: Odessa Fleming M.D.   On: 01/17/2021 07:47    Procedures .Marland KitchenLaceration Repair  Date/Time: 01/17/2021 11:23 AM Performed by: Linwood Dibbles, MD Authorized by: Linwood Dibbles, MD   Consent:    Consent obtained:  Emergent situation Anesthesia:    Anesthesia method:  None Laceration details:    Location: Above the right eyebrow.   Length (cm):  1 Treatment:    Area cleansed with:  Shur-Clens   Amount of cleaning:  Standard   Debridement:  None   Undermining:  None   Scar revision: no   Skin repair:    Repair method:  Tissue adhesive Approximation:    Approximation:  Close Repair type:  Repair type:  Simple Post-procedure details:    Dressing:  Open (no dressing)   Procedure completion:  Tolerated well, no immediate complications .Critical Care  Date/Time: 01/17/2021 11:28 AM Performed by: Linwood DibblesKnapp, Cecilia Nishikawa, MD Authorized by: Linwood DibblesKnapp, Icker Swigert, MD   Critical care provider statement:    Critical care time (minutes):  30   Critical care was time spent personally by me on the following activities:  Discussions with consultants, evaluation of patient's response to treatment, examination of patient, ordering and performing treatments and interventions, ordering and review of laboratory studies, ordering and review of radiographic studies, pulse oximetry, re-evaluation of patient's condition, obtaining history from patient or surrogate and review of old charts   Medications Ordered in ED Medications  sodium chloride 0.9 % bolus 125 mL (125 mLs Intravenous New Bag/Given 01/17/21 0813)  Tdap (BOOSTRIX) injection 0.5 mL (0.5 mLs Intramuscular Given 01/17/21 0813)  iohexol (OMNIPAQUE) 300 MG/ML solution 100 mL (100 mLs  Intravenous Contrast Given 01/17/21 0809)    ED Course  I have reviewed the triage vital signs and the nursing notes.  Pertinent labs & imaging results that were available during my care of the patient were reviewed by me and considered in my medical decision making (see chart for details).  Clinical Course as of 01/17/21 1148  Sun Jan 17, 2021  [id number] Head CT without acute findings [JK]  0929 C-spine CT without fracture [JK]  0929 T12 compression fracture, L4 indeterminate age compression fracture noted on chest abdomen pelvis CT [JK]  0929 Labs do show elevated BUN and creatinine. [JK]  0929 Covid and flu are negative [JK]  0930 Chest x-ray without acute findings [JK]  1118 Knee x-rays without acute findings [JK]  1147 D/w Dr Johnsie Cancelstergaard.  No treatment necessary for the t12 fx [JK]    Clinical Course User Index [JK] Linwood DibblesKnapp, Cliffie Gingras, MD   MDM Rules/Calculators/A&P                          Patient presented to the ED for evaluation after being found outside partially dressed at the facility he lives at.  Patient is normally in the assisted living facility section.  Daughter states he has been having some issues with confusion over the past week.  They thought he might of had a UTI and started him on antibiotics but there has not been really any improvement.  Patient also started a new medication as well recently.  Family is concerned that could be contributing to his symptoms.  Does sound like he has some underlying dementia.  Its possible this is progressing.  Patient's ED work-up does not show any acute abnormalities noted on his head CT or C-spine CT.  He does have evidence of a T12 compression fracture on the CT scans.  Otherwise no other acute injuries.  Labs do not show any signs to suggest acute infection or severe dehydration.  Will check urinalysis.  I will consult neurosurgery regarding his T12 fracture to see if he would benefit from a brace.  I will consult with the medical service for  admission and further evaluation of his confusion, cardiac monitoring. Final Clinical Impression(s) / ED Diagnoses Final diagnoses:  Trauma  Facial laceration, initial encounter  Fall, initial encounter  Altered mental status, unspecified altered mental status type  Compression fracture of T12 vertebra, initial encounter (HCC)     Linwood DibblesKnapp, Caleb Decock, MD 01/17/21 1129

## 2021-01-17 NOTE — ED Notes (Signed)
Pt becoming more agitated. Used interpreter to see what patient was saying, and interpreter states patient walking about another room and saying the number eight. Interpreter states pt confused. Pt attempting to climb out of the bed. Pt adjusted back into the bed and new blanket given.

## 2021-01-17 NOTE — ED Notes (Signed)
Provided pt with some water  

## 2021-01-17 NOTE — ED Notes (Signed)
Verbal report received from Crystal B. RN

## 2021-01-17 NOTE — ED Notes (Signed)
Family at the bedside.

## 2021-01-17 NOTE — ED Notes (Signed)
Attempted to I/O cath patient, unsuccessful with multiple providers. Pt's daughter reports hx of prostate enlargement.

## 2021-01-17 NOTE — Progress Notes (Signed)
Orthopedic Tech Progress Note Patient Details:  Mark Moore 08/01/1873 [id number]   Level 2 trauma  Patient ID: Mark Moore, male   DOB: 08/01/1873, 87 y.o.   MRN: [id number]  Docia Furl 01/17/2021, 7:25 AM

## 2021-01-17 NOTE — ED Notes (Signed)
Patient transported to CT 

## 2021-01-17 NOTE — H&P (Signed)
History and Physical    Mark Moore QZR:[id number] DOB: [birth year] DOA: 01/17/2021  PCP: Wanda Plump, MD Consultants:  None Patient coming from:  [facility], moved to [place] from [place] on 11/29/2020; NOK: Daughter, Mark Moore [place], [phone]  Chief Complaint: AMS, fall  HPI: Mark Moore is a 87 y.o. male with medical history significant of afib and HLD presenting with a fall.  His wife died in 05/14/23 and he was hospitalized in January in [place] for FTT.  His family moved him to the [place] in April and he lived with his daughter for a few weeks prior to transitioning to [facility] (not memory care).  He has had some memory issues but not clearly dementia.  His has been more confusion, hallucinations than actual memory issues.  This was worsening and so last week the facility tested his urine (no urinary symptoms, just the confusion) and diagnosed a UTI; he was started on Bactrim.  He also started taking Wellbutrin about 9 days ago due to depression/prolonged grief.  He has been increasingly confused over the last week but it acutely worsened 2 days ago.  Overnight, he was found outside his facility, half-dressed and having fallen.  In the ER, he has been confused with non-sensical speech.    ED Course: Confusion, fall, T12 compression fracture (nothing to do per neurosurgery).  Found outside of ALF, partially dressed, confused.  +paranoia.  Was confused last week with ?UTI - started on abx without improvement.  Also recently started on Wellbutrin for depression.  No apparent infection, lactate likely due to fall.  Review of Systems: Unable to perform  Ambulatory Status:  Ambulates with a cane    Past Medical History:  Diagnosis Date   Atrial fibrillation, chronic (HCC)    Dementia (HCC)    Depression    DNR (do not resuscitate) 01/17/2021   Dyslipidemia     Past Surgical History:  Procedure Laterality Date   none      Social History    Socioeconomic History   Marital status: Unknown    Spouse name: Not on file   Number of children: Not on file   Years of education: Not on file   Highest education level: Not on file  Occupational History   Occupation: retired  Tobacco Use   Smoking status: Never   Smokeless tobacco: Never  Substance and Sexual Activity   Alcohol use: Never   Drug use: Never   Sexual activity: Not on file  Other Topics Concern   Not on file  Social History Narrative   Not on file   Social Determinants of Health   Financial Resource Strain: Not on file  Food Insecurity: Not on file  Transportation Needs: Not on file  Physical Activity: Not on file  Stress: Not on file  Social Connections: Not on file  Intimate Partner Violence: Not on file    Not on File  History reviewed. No pertinent family history.  Prior to Admission medications   Not on File    Physical Exam: Vitals:   01/17/21 1215 01/17/21 1230 01/17/21 1300 01/17/21 1315  BP: 105/75 107/73 116/83 122/75  Pulse: 83 74 78 75  Resp: 12 19 15 18   Temp:      TempSrc:      SpO2: 91% 100% 90% 94%  Weight:      Height:         General:  Appears confused, in a c-collar with R  facial bruising Eyes:   EOMI, normal lids, iris ENT:  Hard of hearing, grossly normal lips & tongue, mmm Neck:  C-collar in place Cardiovascular:  RRR, no m/r/g. No LE edema.  Respiratory:   CTA bilaterally with no wheezes/rales/rhonchi.  Normal respiratory effort. Abdomen:  soft, NT, ND Skin:  Abrasions along R forehead Musculoskeletal:  grossly normal tone BUE/BLE, good ROM, no bony abnormality Psychiatric:  confused mood and affect, speech sparse and inappropriate Neurologic:  unable to perform    Radiological Exams on Admission: Independently reviewed - see discussion in A/P where applicable  DG Knee 2 Views Left  Result Date: 01/17/2021 CLINICAL DATA:  Pain post fall. EXAM: LEFT KNEE - 1-2 VIEW COMPARISON:  None. FINDINGS: No  evidence of fracture, dislocation, or joint effusion. No evidence of arthropathy or other focal bone abnormality. Mild suprapatellar soft tissue edema. IMPRESSION: Negative. Electronically Signed   By: Ted Mcalpine M.D.   On: 01/17/2021 11:02   DG Knee 2 Views Right  Result Date: 01/17/2021 CLINICAL DATA:  Patient status post fall. EXAM: RIGHT KNEE - 1-2 VIEW COMPARISON:  None. FINDINGS: Normal anatomic alignment. No evidence for acute fracture or dislocation. Regional soft tissues unremarkable. Vascular calcifications. IMPRESSION: No acute osseous abnormality. Electronically Signed   By: Annia Belt M.D.   On: 01/17/2021 11:03   CT HEAD WO CONTRAST  Result Date: 01/17/2021 CLINICAL DATA:  87 year old male found down. On anticoagulation. EXAM: CT HEAD WITHOUT CONTRAST TECHNIQUE: Contiguous axial images were obtained from the base of the skull through the vertex without intravenous contrast. COMPARISON:  None. FINDINGS: Brain: No midline shift, ventriculomegaly, mass effect, evidence of mass lesion, intracranial hemorrhage or evidence of cortically based acute infarction. Patchy and confluent fairly symmetric bilateral cerebral white matter hypodensity. Small area of chronic appearing cortical encephalomalacia also in the anterior right temporal lobe tip (sagittal image 17). Elsewhere preserved gray-white matter differentiation. No other cortical encephalomalacia identified. Vascular: Calcified atherosclerosis at the skull base. No suspicious intracranial vascular hyperdensity. Skull: No fracture identified. Sinuses/Orbits: Left middle ear and mastoids are opacified. And there is chronic right posterior ethmoid and bilateral sphenoid sinus disease with mucoperiosteal thickening. Other sinuses, right tympanic cavity and mastoids are clear. Other: Right forehead scalp hematoma is broad-based, 6 mm in thickness. Visible orbits soft tissues appear to remain normal. No scalp soft tissue gas identified.  IMPRESSION: 1. Right forehead scalp hematoma without underlying skull fracture. 2. No acute intracranial abnormality. Moderately advanced bilateral white matter changes most commonly due to small vessel disease. Chronic encephalomalacia at the right temporal lobe tip, might be sequelae of remote trauma. 3. Chronic paranasal sinus disease. Left middle ear and mastoid opacification are likely inflammatory, such as otitis media or cholesteatoma. Electronically Signed   By: Odessa Fleming M.D.   On: 01/17/2021 08:55   CT CERVICAL SPINE WO CONTRAST  Result Date: 01/17/2021 CLINICAL DATA:  87 year old male found down. On anticoagulation. EXAM: CT CERVICAL SPINE WITHOUT CONTRAST TECHNIQUE: Multidetector CT imaging of the cervical spine was performed without intravenous contrast. Multiplanar CT image reconstructions were also generated. COMPARISON:  Head CT today. FINDINGS: Alignment: Mild straightening of cervical lordosis. Cervicothoracic junction alignment is within normal limits. Bilateral posterior element alignment is within normal limits. Skull base and vertebrae: Visualized skull base is intact. No atlanto-occipital dissociation. Maintained C1-C2 alignment, in those levels appear intact. No acute osseous abnormality identified. Soft tissues and spinal canal: No prevertebral fluid or swelling. No visible canal hematoma. Bulky left carotid calcified atherosclerosis in the  neck. Disc levels: Upper cervical facet degeneration, possible developing facet ankylosis on the right at C2-C3. Widespread cervical disc bulging and mild endplate spurring. Suspect mild degenerative spinal stenosis at C4-C5 when combined with ligament flavum hypertrophy. Upper chest: Visible upper thoracic levels appear intact. Negative lung apices. Partially visible calcified aortic arch atherosclerosis. Small volume retained secretions along the left wall of the trachea at the thoracic inlet. Other: Negative visible posterior fossa. IMPRESSION: 1. No  acute traumatic injury identified in the cervical spine. 2. Cervical spine degeneration with suspected mild spinal stenosis at C4-C5. 3. Calcified left carotid atherosclerosis in the neck. 4. Aortic Atherosclerosis (ICD10-I70.0). Electronically Signed   By: Odessa Fleming M.D.   On: 01/17/2021 09:22   CT CHEST ABDOMEN PELVIS W CONTRAST  Result Date: 01/17/2021 CLINICAL DATA:  87 year old male found down.  On anticoagulation. EXAM: CT CHEST, ABDOMEN, AND PELVIS WITH CONTRAST TECHNIQUE: Multidetector CT imaging of the chest, abdomen and pelvis was performed following the standard protocol during bolus administration of intravenous contrast. CONTRAST:  OMNIPAQUE IOHEXOL 300 MG/ML  SOLN COMPARISON:  Portable chest x-ray today. FINDINGS: CT CHEST FINDINGS Cardiovascular: Tortuous thoracic aorta with calcified atherosclerosis. Extensive coronary artery calcified plaque or stents. Major vascular structures in the chest appear intact. Central pulmonary arteries appear patent. Cardiomegaly with no pericardial effusion. Mediastinum/Nodes: Negative. No mediastinal hematoma or lymphadenopathy. Lungs/Pleura: Trace retained secretions in the trachea below the thoracic inlet. Otherwise the major airways are patent. Mild dependent opacity in the lungs, greater on the left, most resembles atelectasis. No pneumothorax or pleural effusion. Superimposed mild right middle lobe lateral segment scarring including a small triangular nodule in the minor fissure which is probably an intrapleural lymph node (series 4, image 43). No pneumothorax. No pleural effusion. No convincing pulmonary contusion. Musculoskeletal: T12 superior endplate compression fracture appears acute with subchondral lucency. The T12 pedicles and posterior elements appear intact. There is no significant retropulsion. 35% loss of height. Other thoracic vertebrae appear intact. Intact sternum. Visible shoulder osseous structures appear intact. Chronic appearing  posterior left 8th and 9th rib fractures. No convincing acute rib fracture. CT ABDOMEN PELVIS FINDINGS Hepatobiliary: Small circumscribed low-density area in the central liver on series 4, image 47 does not appear traumatic and is probably a benign cyst. Elsewhere the liver and gallbladder are within normal limits. Pancreas: Negative. Spleen: The spleen appears intact.  No perisplenic fluid. Adrenals/Urinary Tract: Normal adrenal glands. Symmetric renal enhancement and contrast excretion. Benign left renal parapelvic cyst. Left midpole nephrolithiasis. Decompressed proximal ureters. Unremarkable bladder. Numerous pelvic phleboliths. Stomach/Bowel: Gas and liquid stool in the rectum. Redundant sigmoid colon which tracks into the right upper quadrant. Redundant transverse colon and flexures containing gas. Retained stool in the right colon. And the cecum is on a lax mesentery in the mid abdomen. No large bowel inflammation. Appendix not delineated. No dilated small bowel. Decompressed stomach and duodenum. No free air or free fluid identified. Vascular/Lymphatic: Aortoiliac soft and calcified atherosclerosis. Tortuous infrarenal abdominal aorta. Major arterial structures remain patent. Portal venous timing is early. On delayed images the portal venous system appears to be patent. No lymphadenopathy. Reproductive: Negative. Other: No pelvic free fluid. Musculoskeletal: L4 superior endplate compression fracture with 35% loss of vertebral body height. L4 pedicles and posterior elements appear intact. No significant retropulsion. This appears age indeterminate. Superimposed grade 1 spondylolisthesis of L4 on L5. Other lumbar levels appear intact. Sacrum, SI joints, pelvis and proximal femurs are intact. IMPRESSION: 1. Acute T12 compression fractures with 35% loss of  height. No significant retropulsion or complicating features. 2. Age indeterminate L4 compression fracture with similar loss of height, no retropulsion.  Underlying chronic degenerative appearing grade 1 spondylolisthesis at L4-L5. 3. No other acute traumatic injury identified in the chest, abdomen, or pelvis. 4. Cardiomegaly. Extensive coronary artery and Aortic Atherosclerosis (ICD10-I70.0). Left nephrolithiasis. Redundant large bowel. Electronically Signed   By: Odessa FlemingH  Hall M.D.   On: 01/17/2021 09:08   DG Chest Port 1 View  Result Date: 01/17/2021 CLINICAL DATA:  Male of unknown age status post fall. Found down. On anticoagulation. EXAM: PORTABLE CHEST 1 VIEW COMPARISON:  None. FINDINGS: Portable AP supine view at 0717 hours. Mildly tortuous thoracic aorta with calcified atherosclerosis. Other mediastinal contours are within normal limits. Visualized tracheal air column is within normal limits. Allowing for portable technique the lungs are clear. No pneumothorax or pleural effusion identified on this supine view. Abundant gas-filled but nondilated bowel loops in the upper abdomen. No displaced rib fracture identified. No acute osseous abnormality identified. IMPRESSION: No acute cardiopulmonary abnormality or acute traumatic injury identified. Electronically Signed   By: Odessa FlemingH  Hall M.D.   On: 01/17/2021 07:47    EKG: not done   Labs on Admission: I have personally reviewed the available labs and imaging studies at the time of the admission.  Pertinent labs:   BUN 48/Creatinine 1.44/GFR 46; 30/1.03/>60 on 5/4 Lactate 3.8 WBC 8.2 Hgb 12.2 INR 1.6 COVID/flu negative ETOH <10 UA: moderate Hgb, trace LE, many bacteria   Assessment/Plan Principal Problem:   Fall (on)(from) sidewalk curb, initial encounter Active Problems:   Acute metabolic encephalopathy   Atrial fibrillation, chronic (HCC)   Dementia (HCC)   Depression   Dyslipidemia   Glaucoma   Thoracic compression fracture, closed, initial encounter (HCC)   DNR (do not resuscitate)   Fall -Patient apparently wandered away from his ALF and was found down outside the facility -Cleared  from a trauma standpoint other than thoracic compression fracture (see below) -C-spine is cleared, should be ok for C-collar to be removed (I have asked EDP to take care of this)  Acute metabolic encephalopathy -Patient presenting with encephalopathy as evidenced by his confusion -While the patient does have apparent underlying dementia (not previously diagnosed, but present based on daughter's description), this is a change compared to his usual baseline mental status -Most likely diagnosis is superimposed issues associated with initiation of Wellbutrin; would recommend stopping this medication and monitoring him off to see if cognitive impairment improves -He was also recently diagnosed with UTI; infection could be a cause but he had no other apparent symptoms and treating with Bactrim has not helped.  Will repeat urine culture, but UA is reassuring and so no further treatment is currently ordered. -Based on unremarkable evaluation with current ability to protect his airway, will observe for now with IVF hydration  -50% of patients with delirium while hospitalized will be institutionalized at 6 months, and these patients have a 25% mortality at 6 months -The family would benefit from being referred to the [facility] [employer] [facility] and also provided with the [employer] website   Dementia -Not previously diagnosed, but he has had apparent issues with sundowning while hospitalized in January and with his transition from home in [place] to here (not being able to work the remote control, for example) -Suspect underlying cognitive impairment which is being increasingly unmasked -ALF appears to be insufficient level of care and he likely needs to transition to memory care  Afib -Rate controlled  with Diltiazem -Pradaxa for [facility]  Depression -He was previously on Lexapro at PCP f/u -Most recently taking Wellbutrin (in addition?) -Will hold both for now and monitor -Has a sitter due to  agitation  HLD -Continue Lipitor  Glaucoma -Continue Alphagan, Trusopt  T-spine compression fracture -Per EDP discussion, neurosurgery (Dr. Maurice Small) did not recommend intervention -There appears to be no limitation in movement and TLSO is not recommended  DNR -I have discussed code status with the patient's daughter; he would not desire resuscitation and would prefer to die a natural death should that situation arise. -He will need a gold out of facility DNR form at the time of discharge      Note: This patient has been tested and is negative for the novel coronavirus COVID-19.   Level of care: Med-Surg DVT prophylaxis:  Lovenox  Code Status:  DNR - confirmed with family Family Communication: Daughter was present throughout evaluation Disposition Plan:  The patient is from: ALF  Anticipated d/c is to: ALF - Memory care?  Anticipated d/c date will depend on clinical response to treatment, but possibly as early as tomorrow if she has excellent response to treatment  Patient is currently: acutely ill Consults called: PT/OT/ST/Toc team  Admission status:  It is my clinical opinion that referral for OBSERVATION is reasonable and necessary in this patient based on the above information provided. The aforementioned taken together are felt to place the patient at high risk for further clinical deterioration. However it is anticipated that the patient may be medically stable for discharge from the hospital within 24 to 48 hours.    Jonah Blue MD [facility]   How to contact the [facility] Attending or Consulting provider 7A - 7P or covering provider during after hours 7P -7A, for this patient?  Check the care team in [facility] and look for a) attending/consulting TRH provider listed and b) the [facility] team listed Log into [email or web address] and use [place]'s universal password to access. If you do not have the password, please contact the hospital operator. Locate the [facility] provider you are  looking for under [facility] and page to a number that you can be directly reached. If you still have difficulty reaching the provider, please page the [facility] (Director on Call) for the Hospitalists listed on amion for assistance.   01/17/2021, 3:32 PM

## 2021-01-17 NOTE — ED Notes (Addendum)
Pt found outside of [name] [place] [address] face down on sidewalk, unknown downtime or baseline. Spanish speaking. Unknown if patient lives at this facility, no ID found on patient at this time. Ambulating outside with bag of various photos and keys, cell phone, no pants on. Skin tears to bilateral knees, right shoulder abrasion, laceration and skin tear to right eye. ccollar in place.

## 2021-01-17 NOTE — ED Notes (Signed)
Attempted to call report while pt had a ready bed while on the phone bed assignment changed to a not ready room. While attempting to give report advised room hadnt been assigned at this time. Provided number for someone to call back. My charge nurse called and made aware

## 2021-01-17 NOTE — TOC Initial Note (Signed)
Transition of Care [facility]) - Initial/Assessment Note    Patient Details  Name: Mark Moore MRN: [id number] Date of Birth: [birth year]  Transition of [facility] CM/SW Contact:    Lockie Pares, RN Phone Number: 01/17/2021, 3:32 PM  Clinical Narrative:                  Patient arrived to [facility] via [facility], was outside [facility] no pants on, with pictures and cell phone in bag, Had been there on ground face down undetermined amount of time.  [name] was called by charge RN to verify that he was in residence there. He is being admitted  for [facility]. PT and OT evaluations are pending, will likely need SNF placement. CSW /CM will follow for needs  Expected Discharge Plan: Skilled Nursing Facility Barriers to Discharge: Continued Medical Work up   Patient Goals and CMS Choice        Expected Discharge Plan and Services Expected Discharge Plan: Skilled Nursing Facility In-house Referral: Clinical Social Work     Living arrangements for the past 2 months: Assisted Living Facility                                      Prior Living Arrangements/Services Living arrangements for the past 2 months: Assisted Living Facility   Patient language and need for interpreter reviewed:: Yes (Spanish speaking)        Need for Family Participation in Patient Care: Yes (Comment) Care giver support system in place?: Yes (comment)   Criminal Activity/Legal Involvement Pertinent to Current Situation/Hospitalization: No - Comment as needed  Activities of Daily Living      Permission Sought/Granted                  Emotional Assessment       Orientation: : Fluctuating Orientation (Suspected and/or reported Sundowners) Alcohol / Substance Use: Not Applicable Psych Involvement: No (comment)  Admission diagnosis:  Acute metabolic encephalopathy [G93.41] Patient Active Problem List   Diagnosis Date Noted   Acute metabolic encephalopathy 01/17/2021   Atrial fibrillation,  chronic (HCC) 01/17/2021   Fall (on)(from) sidewalk curb, initial encounter 01/17/2021   Dementia (HCC) 01/17/2021   Depression 01/17/2021   Dyslipidemia 01/17/2021   Glaucoma 01/17/2021   Thoracic compression fracture, closed, initial encounter (HCC) 01/17/2021   PCP:  Wanda Plump, MD Pharmacy:  No Pharmacies Listed    Social Determinants of Health (SDOH) Interventions    Readmission Risk Interventions No flowsheet data found.

## 2021-01-18 DIAGNOSIS — H409 Unspecified glaucoma: Secondary | ICD-10-CM | POA: Diagnosis present

## 2021-01-18 DIAGNOSIS — G9340 Encephalopathy, unspecified: Secondary | ICD-10-CM | POA: Diagnosis not present

## 2021-01-18 DIAGNOSIS — I482 Chronic atrial fibrillation, unspecified: Secondary | ICD-10-CM

## 2021-01-18 DIAGNOSIS — W101XXA Fall (on)(from) sidewalk curb, initial encounter: Secondary | ICD-10-CM | POA: Diagnosis not present

## 2021-01-18 DIAGNOSIS — Z23 Encounter for immunization: Secondary | ICD-10-CM | POA: Diagnosis present

## 2021-01-18 DIAGNOSIS — T1490XA Injury, unspecified, initial encounter: Secondary | ICD-10-CM | POA: Diagnosis present

## 2021-01-18 DIAGNOSIS — M4854XA Collapsed vertebra, not elsewhere classified, thoracic region, initial encounter for fracture: Secondary | ICD-10-CM | POA: Diagnosis present

## 2021-01-18 DIAGNOSIS — N390 Urinary tract infection, site not specified: Secondary | ICD-10-CM | POA: Diagnosis present

## 2021-01-18 DIAGNOSIS — Z20822 Contact with and (suspected) exposure to covid-19: Secondary | ICD-10-CM | POA: Diagnosis present

## 2021-01-18 DIAGNOSIS — I480 Paroxysmal atrial fibrillation: Secondary | ICD-10-CM | POA: Diagnosis present

## 2021-01-18 DIAGNOSIS — S22080A Wedge compression fracture of T11-T12 vertebra, initial encounter for closed fracture: Secondary | ICD-10-CM | POA: Diagnosis not present

## 2021-01-18 DIAGNOSIS — F32A Depression, unspecified: Secondary | ICD-10-CM | POA: Diagnosis present

## 2021-01-18 DIAGNOSIS — W010XXA Fall on same level from slipping, tripping and stumbling without subsequent striking against object, initial encounter: Secondary | ICD-10-CM | POA: Diagnosis not present

## 2021-01-18 DIAGNOSIS — R4182 Altered mental status, unspecified: Secondary | ICD-10-CM | POA: Diagnosis not present

## 2021-01-18 DIAGNOSIS — G9341 Metabolic encephalopathy: Secondary | ICD-10-CM | POA: Diagnosis present

## 2021-01-18 DIAGNOSIS — B961 Klebsiella pneumoniae [K. pneumoniae] as the cause of diseases classified elsewhere: Secondary | ICD-10-CM | POA: Diagnosis present

## 2021-01-18 DIAGNOSIS — Y92480 Sidewalk as the place of occurrence of the external cause: Secondary | ICD-10-CM | POA: Diagnosis not present

## 2021-01-18 DIAGNOSIS — E785 Hyperlipidemia, unspecified: Secondary | ICD-10-CM | POA: Diagnosis present

## 2021-01-18 DIAGNOSIS — Z1612 Extended spectrum beta lactamase (ESBL) resistance: Secondary | ICD-10-CM | POA: Diagnosis present

## 2021-01-18 DIAGNOSIS — E876 Hypokalemia: Secondary | ICD-10-CM | POA: Diagnosis present

## 2021-01-18 DIAGNOSIS — Z66 Do not resuscitate: Secondary | ICD-10-CM | POA: Diagnosis present

## 2021-01-18 DIAGNOSIS — S0181XA Laceration without foreign body of other part of head, initial encounter: Secondary | ICD-10-CM | POA: Diagnosis present

## 2021-01-18 DIAGNOSIS — F0390 Unspecified dementia without behavioral disturbance: Secondary | ICD-10-CM | POA: Diagnosis present

## 2021-01-18 LAB — CBC
HCT: 35.2 % — ABNORMAL LOW (ref 39.0–52.0)
Hemoglobin: 12.1 g/dL — ABNORMAL LOW (ref 13.0–17.0)
MCH: 32.4 pg (ref 26.0–34.0)
MCHC: 34.4 g/dL (ref 30.0–36.0)
MCV: 94.1 fL (ref 80.0–100.0)
Platelets: 259 10*3/uL (ref 150–400)
RBC: 3.74 MIL/uL — ABNORMAL LOW (ref 4.22–5.81)
RDW: 15 % (ref 11.5–15.5)
WBC: 9.6 10*3/uL (ref 4.0–10.5)
nRBC: 0 % (ref 0.0–0.2)

## 2021-01-18 LAB — BASIC METABOLIC PANEL
Anion gap: 15 (ref 5–15)
BUN: 28 mg/dL — ABNORMAL HIGH (ref 8–23)
CO2: 22 mmol/L (ref 22–32)
Calcium: 9.8 mg/dL (ref 8.9–10.3)
Chloride: 103 mmol/L (ref 98–111)
Creatinine, Ser: 1.21 mg/dL (ref 0.61–1.24)
GFR, Estimated: 57 mL/min — ABNORMAL LOW (ref >60)
Glucose, Bld: 71 mg/dL (ref 70–99)
Potassium: 2.7 mmol/L — CL (ref 3.5–5.1)
Sodium: 140 mmol/L (ref 135–145)

## 2021-01-18 MED ORDER — DABIGATRAN ETEXILATE MESYLATE 150 MG PO CAPS
150.0000 mg | ORAL_CAPSULE | Freq: Two times a day (BID) | ORAL | Status: DC
  Administered 2021-01-18 – 2021-02-01 (×29): 150 mg via ORAL
  Filled 2021-01-18 (×31): qty 1

## 2021-01-18 MED ORDER — POTASSIUM CHLORIDE CRYS ER 20 MEQ PO TBCR
40.0000 meq | EXTENDED_RELEASE_TABLET | Freq: Four times a day (QID) | ORAL | Status: AC
  Administered 2021-01-18 (×2): 40 meq via ORAL
  Filled 2021-01-18 (×2): qty 2

## 2021-01-18 MED ORDER — DABIGATRAN ETEXILATE MESYLATE 75 MG PO CAPS
75.0000 mg | ORAL_CAPSULE | Freq: Two times a day (BID) | ORAL | Status: DC
  Filled 2021-01-18: qty 1

## 2021-01-18 NOTE — Progress Notes (Signed)
ANTICOAGULATION CONSULT NOTE - Initial Consult  Pharmacy Consult for Pradaxa Indication: atrial fibrillation  Allergies  Allergen Reactions   Wellbutrin [Bupropion] Other (See Comments)    XL 150 mg- Made the patient feel paranoid and have borderline hallucinations    Patient Measurements: Height: 5\' 9"  (175.3 cm) Weight: 81.6 kg (180 lb) IBW/kg (Calculated) : 70.7  Vital Signs: Temp: 98.6 F (37 C) (06/20 0020) Temp Source: Oral (06/20 0020) BP: 159/96 (06/20 0020) Pulse Rate: 113 (06/20 0020)  Labs: Recent Labs    01/17/21 0725 01/17/21 0733 01/18/21 0341  HGB 12.2* 12.2* 12.1*  HCT 37.2* 36.0* 35.2*  PLT 276  --  259  LABPROT 18.8*  --   --   INR 1.6*  --   --   CREATININE 1.44* 1.40* 1.21  CKTOTAL 107  --   --     Estimated Creatinine Clearance: 41.4 mL/min (by C-G formula based on SCr of 1.21 mg/dL).   Medical History: Past Medical History:  Diagnosis Date   Atrial fibrillation, chronic (HCC)    Dementia (HCC)    Depression    DNR (do not resuscitate) 01/17/2021   Dyslipidemia     Assessment: CC/HPI: found outside of [name] [place] [address] face down on sidewalk, unknown downtime  PMH: afib and HLD, FTT, depression/grief, dementia?, Glaucoma,   Anticoag: Pradaxa continued from PTA for afib. CBC WNL and stable.Scr 1.21 down. Crcl 47.  Goal of Therapy:  Therapeutic oral anticoagulation   Plan:  Pradaxa 150mg  BID    Nolita Kutter S. 01/19/2021, PharmD, BCPS Clinical Staff Pharmacist [email or web address] , Maalik Pinn Stillinger 01/18/2021,8:24 AM

## 2021-01-18 NOTE — Evaluation (Signed)
Speech Language Pathology Evaluation Patient Details Name: Mark Moore MRN: [id number] DOB: 01-Nov-1928 Today's Date: 01/18/2021 Time: 1040-1050 SLP Time Calculation (min) (ACUTE ONLY): 10 min  Problem List:  Patient Active Problem List   Diagnosis Date Noted   Acute metabolic encephalopathy 01/17/2021   Atrial fibrillation, chronic (HCC) 01/17/2021   Fall (on)(from) sidewalk curb, initial encounter 01/17/2021   Dementia (HCC) 01/17/2021   Depression 01/17/2021   Dyslipidemia 01/17/2021   Glaucoma 01/17/2021   Thoracic compression fracture, closed, initial encounter (HCC) 01/17/2021   DNR (do not resuscitate) 01/17/2021   Past Medical History:  Past Medical History:  Diagnosis Date   Atrial fibrillation, chronic (HCC)    Dementia (HCC)    Depression    DNR (do not resuscitate) 01/17/2021   Dyslipidemia    Past Surgical History:  Past Surgical History:  Procedure Laterality Date   none     HPI:  Mark Moore is a 87 y.o. male with medical history significant of afib and HLD presenting with a fall.  His wife died in April 18, 2023 and he was hospitalized in January in [place] for FTT.  His family moved him to the [place] in April and he lived with his daughter for a few weeks prior to transitioning to [facility] (not memory care).  He has had some memory issues but not clearly dementia.  His has been more confusion, hallucinations than actual memory issues.  This was worsening and so last week the facility tested his urine (no urinary symptoms, just the confusion) and diagnosed a UTI; he was started on Bactrim.  He also started taking Wellbutrin about 9 days ago due to depression/prolonged grief.  He has been increasingly confused over the last week but it acutely worsened 2 days ago.  Overnight, he was found outside his facility, half-dressed and having fallen.  In the ER, he has been confused with non-sensical speech.   Assessment / Plan /  Recommendation Clinical Impression  Brief initial eval started. Pt pleasant and attentive to speker, but easily falls asleep without continuous stimuli. Daughter reports he recieved sedating medication last night and is still groggy, though his cognition appears much improved today. Pt is able to state his name and participate in basic social language as well as follow simple commands. He cannot successfully repond to any other orientation questions and though the SLUMS was initaited, will return for further diagnostic treatment when pt is more alert.    SLP Assessment  SLP Recommendation/Assessment: Patient needs continued Speech Lanaguage Pathology Services SLP Visit Diagnosis: Cognitive communication deficit (R41.841)    Follow Up Recommendations  Skilled Nursing facility    Frequency and Duration min 2x/week  2 weeks      SLP Evaluation Cognition  Overall Cognitive Status: Impaired/Different from baseline Arousal/Alertness: Suspect due to medications Orientation Level: Oriented to person;Disoriented to place;Disoriented to time;Disoriented to situation Attention: Focused;Sustained Focused Attention: Impaired Sustained Attention: Impaired Memory: Impaired Memory Impairment: Decreased short term memory       Comprehension  Auditory Comprehension Overall Auditory Comprehension: Impaired Yes/No Questions: Impaired    Expression Verbal Expression Overall Verbal Expression: Appears within functional limits for tasks assessed   Oral / Motor  Oral Motor/Sensory Function Overall Oral Motor/Sensory Function: Within functional limits Motor Speech Overall Motor Speech: Impaired Resonance: Hypernasality Articulation: Impaired Level of Impairment: Word Intelligibility: Intelligibility reduced Word: 50-74% accurate Phrase: 50-74% accurate Sentence: 50-74% accurate Conversation: 50-74% accurate Motor Planning: Witnin functional limits Motor Speech Errors: Consistent   GO  Harlon Ditty, MA CCC-SLP  Acute Rehabilitation Services Pager [phone] Office [phone]  Claudine Mouton 01/18/2021, 11:45 AM

## 2021-01-18 NOTE — Consult Note (Signed)
Neurosurgery Consultation  Reason for Consult: Thoracic spine fracture Referring Physician: Wyline Copas  CC: Fall, altered mental status  HPI: This is a 87 y.o. 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 No further history available. He endorses some low back pain but it's mild, no radicular pain. No new weakness, numbness, or parasthesias, no recent change in bowel or bladder function. He takes dabigatran for PAF.   ROS: A 14 point ROS was performed and is negative except as noted in the HPI.   PMHx:  Past Medical History:  Diagnosis Date   Atrial fibrillation, chronic (HCC)    Dementia ([place])    Depression    DNR (do not resuscitate) 01/17/2021   Dyslipidemia    FamHx: History reviewed. No pertinent family history. SocHx:  reports that he has never smoked. He has never used smokeless tobacco. He reports that he does not drink alcohol and does not use drugs.  Exam: Vital signs in last 24 hours: Temp:  [98.6 F (37 C)] 98.6 F (37 C) (06/20 0020) Pulse Rate:  [74-113] 113 (06/20 0020) Resp:  [12-20] 20 (06/20 0020) BP: (105-159)/(62-96) 159/96 (06/20 0020) SpO2:  [90 %-100 %] 95 % (06/20 0020) General: Awake, alert, cooperative, lying in bed in NAD Head: Normocephalic HEENT: Neck supple Pulmonary: breathing room air comfortably, no evidence of increased work of breathing Cardiac: RRR Abdomen: S NT ND Extremities: Warm and well perfused x4 Neuro: Spanish speaking, AOx3, PERRL, EOMI, FS Strength 5/5 x4, SILTx4   Assessment and Plan: 87 y.o. man s/p fall. CT CAP personally reviewed with respect to spinal anatomy, which shows T12 superior endplate compression fracture w/o retropulsion, roughly 30% LOH, also likely chronic L4 compression fracture, otherwise chronic / degen findings.  -no acute neurosurgical intervention indicated at this time -no brace needed, activity as tolerated, no restrictions -please call with any  concerns or questions  Judith Part, MD 01/18/21 10:40 AM [facility]

## 2021-01-18 NOTE — Evaluation (Signed)
Physical Therapy Evaluation Patient Details Name: Mark Moore MRN: [id number] DOB: October 07, 1928 Today's Date: 01/18/2021   History of Present Illness  87 y.o. male presented 01/17/21 with a fall and confusion. T12 compression fx (neurosurgery rec no intervention, no brace needed), ?UTI vs side effects of new med;  PMH significant of afib and HLD, wife died in 05-10-2023 and hospitalized in January with FTT (in [place] family moved him to ALF in [place]; dementia  Clinical Impression   Pt admitted secondary to problem above with deficits below. PTA patient was mobilizing with RW and supervision living in an ALF.  Pt currently requires up to +2 moderate assistance for bed mobility and ambulation with RW. Per daughter, his cognition has significantly improved from yesterday to today. Anticipate patient will benefit from PT to address problems listed below.Will continue to follow acutely to maximize functional mobility independence and safety. Feel pt could benefit from CIR prior to return to ALF.       Follow Up Recommendations CIR    Equipment Recommendations  None recommended by PT    Recommendations for Other Services Rehab consult     Precautions / Restrictions Precautions Precautions: Fall Precaution Comments: back precautions for comfort      Mobility  Bed Mobility Overal bed mobility: Needs Assistance Bed Mobility: Rolling;Sidelying to Sit Rolling: Mod assist Sidelying to sit: Mod assist;+2 for physical assistance       General bed mobility comments: cues for technique to protect his back; required repeated cues and use of rail; assist to move legs over EOB and to raise torso to sit EOB    Transfers Overall transfer level: Needs assistance Equipment used: Rolling walker (2 wheeled) Transfers: Sit to/from Stand Sit to Stand: Min assist         General transfer comment: pt prefers to have hands on RW as he comes to stand  Ambulation/Gait Ambulation/Gait  assistance: Mod assist;+2 physical assistance;+2 safety/equipment Gait Distance (Feet): 13 Feet (seated rest, 10 ft) Assistive device: Rolling walker (2 wheeled) Gait Pattern/deviations: Step-to pattern;Step-through pattern;Decreased step length - left;Decreased weight shift to left;Narrow base of support Gait velocity: decr   General Gait Details: leaning to his right with poor wt-shift over his LLE; cued pt to step RLE laterally/wider to incr base of support with pt unable to follow command  Stairs            Wheelchair Mobility    Modified Rankin (Stroke Patients Only)       Balance Overall balance assessment: Needs assistance Sitting-balance support: No upper extremity supported;Feet supported Sitting balance-Leahy Scale: Fair     Standing balance support: Bilateral upper extremity supported Standing balance-Leahy Scale: Poor Standing balance comment: bil UE support via RW with rt lean                             Pertinent Vitals/Pain Pain Assessment: Faces Faces Pain Scale: Hurts little more Pain Location: back Pain Descriptors / Indicators: Discomfort Pain Intervention(s): Limited activity within patient's tolerance;Monitored during session    Home Living Family/patient expects to be discharged to:: Assisted living               Home Equipment: Walker - 2 wheels;Walker - 4 wheels      Prior Function Level of Independence: Needs assistance   Gait / Transfers Assistance Needed: supervision with RW (daughter states he took it with him everywhere--knew he needed it)  Hand Dominance        Extremity/Trunk Assessment   Upper Extremity Assessment Upper Extremity Assessment: Defer to OT evaluation    Lower Extremity Assessment Lower Extremity Assessment: Generalized weakness    Cervical / Trunk Assessment Cervical / Trunk Assessment: Normal  Communication   Communication: Interpreter utilized;Prefers language other than  English (speaks Spanish; interpreter [name] (daughter is employed as a Ecologist and prefers to translate for her father))  Cognition Arousal/Alertness: Awake/alert Behavior During Therapy: WFL for tasks assessed/performed Overall Cognitive Status: Impaired/Different from baseline                                 General Comments: oriented to person, place, time (month, year) but not reason for hospitalization; per daughter is much better but not back to his normal      General Comments      Exercises     Assessment/Plan    PT Assessment Patient needs continued PT services  PT Problem List Decreased strength;Decreased balance;Decreased mobility;Decreased cognition;Decreased knowledge of use of DME;Decreased safety awareness;Pain       PT Treatment Interventions DME instruction;Gait training;Functional mobility training;Therapeutic activities;Therapeutic exercise;Balance training;Cognitive remediation;Patient/family education    PT Goals (Current goals can be found in the Care Plan section)  Acute Rehab PT Goals Patient Stated Goal: agrees wants to get OOB PT Goal Formulation: With family Time For Goal Achievement: 02/01/21 Potential to Achieve Goals: Good    Frequency Min 3X/week   Barriers to discharge Decreased caregiver support from ALF and daughter ultimately wants pt toreturn to ALF    Co-evaluation               AM-PAC PT "6 Clicks" Mobility  Outcome Measure Help needed turning from your back to your side while in a flat bed without using bedrails?: A Lot Help needed moving from lying on your back to sitting on the side of a flat bed without using bedrails?: Total Help needed moving to and from a bed to a chair (including a wheelchair)?: Total Help needed standing up from a chair using your arms (e.g., wheelchair or bedside chair)?: Total Help needed to walk in hospital room?: Total Help needed climbing 3-5 steps with a railing? : Total 6  Click Score: 7    End of Session Equipment Utilized During Treatment: Gait belt Activity Tolerance: Patient tolerated treatment well Patient left: in chair;with call bell/phone within reach;with chair alarm set;with family/visitor present Nurse Communication: Mobility status PT Visit Diagnosis: Unsteadiness on feet (R26.81);History of falling (Z91.81)    Time: 3790-2409 PT Time Calculation (min) (ACUTE ONLY): 22 min   Charges:   PT Evaluation $PT Eval Low Complexity: 1 Low           Jerolyn Center, PT Pager [phone]   Zena Amos 01/18/2021, 12:52 PM

## 2021-01-18 NOTE — Progress Notes (Signed)
Inpatient Rehab Admissions Coordinator Note:   Per therapy recommendations, pt was screened for CIR candidacy by Estill Dooms, PT, DPT.  Note admitted for encephalopathy (resolving and likely due to UTI superimposed on progressing dementia), and T12 compression fx, conservative management, non-op, no bracing. At this time pt does not appear to have the medical complexity to warrant a CIR admission, would recommend f/u in a lower level of care, SNF versus HH.   Please contact me with questions.   Estill Dooms, PT, DPT [phone] 01/18/21 3:06 PM

## 2021-01-18 NOTE — Evaluation (Signed)
Occupational Therapy Evaluation Patient Details Name: Mark Moore MRN: [id number] DOB: Sep 18, 1928 Today's Date: 01/18/2021    History of Present Illness 87 y.o. male presented 01/17/21 with a fall and confusion. T12 compression fx (neurosurgery rec no intervention, no brace needed), ?UTI vs side effects of new med;  PMH significant of afib and HLD, wife died in May 11, 2023 and hospitalized in January with FTT (in [place] family moved him to ALF in [place]; dementia   Clinical Impression   This 87 yo male admitted with above presents to acute OT with PLOF of being S for showering, independent with dressing and toileting, Mod I with RW or rollator for ambulation. Currently pt is setup/S- Mod A for basic ADLs and Min A -Mod A +2 for mobility. He will continue to benefit from acute OT with follow up on CIR.    Follow Up Recommendations  CIR;Supervision - Intermittent    Equipment Recommendations  Other (comment) (TBD next venue)       Precautions / Restrictions Precautions Precautions: Fall Precaution Comments: back precautions for comfort      Mobility Bed Mobility Overal bed mobility: Needs Assistance Bed Mobility: Rolling;Sidelying to Sit Rolling: Mod assist Sidelying to sit: Mod assist;+2 for physical assistance       General bed mobility comments: cues for technique to protect his back; required repeated cues and use of rail; assist to move legs over EOB and to raise torso to sit EOB    Transfers Overall transfer level: Needs assistance Equipment used: Rolling walker (2 wheeled) Transfers: Sit to/from Stand Sit to Stand: Min assist         General transfer comment: pt prefers to have hands on RW as he comes to stand    Balance Overall balance assessment: Needs assistance Sitting-balance support: No upper extremity supported;Feet supported Sitting balance-Leahy Scale: Fair     Standing balance support: Bilateral upper extremity supported Standing  balance-Leahy Scale: Poor Standing balance comment: bil UE support via RW with rt lean                           ADL either performed or assessed with clinical judgement   ADL Overall ADL's : Needs assistance/impaired Eating/Feeding: Set up;Supervision/ safety (supported sitting)   Grooming: Set up;Supervision/safety Grooming Details (indicate cue type and reason): supported sitting Upper Body Bathing: Supervision/ safety;Set up Upper Body Bathing Details (indicate cue type and reason): supported sitting Lower Body Bathing: Minimal assistance;Sit to/from stand   Upper Body Dressing : Supervision/safety;Set up Upper Body Dressing Details (indicate cue type and reason): supoported sitting Lower Body Dressing: Moderate assistance Lower Body Dressing Details (indicate cue type and reason): min A sit<>stand Toilet Transfer: Moderate assistance;+2 for physical assistance;Ambulation;RW Toilet Transfer Details (indicate cue type and reason): bed>door (x2) Toileting- Clothing Manipulation and Hygiene: Moderate assistance Toileting - Clothing Manipulation Details (indicate cue type and reason): min A sit<>stand             Vision Patient Visual Report: No change from baseline              Pertinent Vitals/Pain Pain Assessment: Faces Faces Pain Scale: Hurts little more Pain Location: back Pain Descriptors / Indicators: Discomfort Pain Intervention(s): Limited activity within patient's tolerance;Monitored during session;Repositioned     Hand Dominance Right   Extremity/Trunk Assessment Upper Extremity Assessment Upper Extremity Assessment: Generalized weakness   Lower Extremity Assessment Lower Extremity Assessment: Generalized weakness   Cervical / Trunk Assessment Cervical /  Trunk Assessment: Normal   Communication Communication Communication: Interpreter utilized;Prefers language other than English (pt's dtr who is also a Chief Operating Officer interpreter)    Cognition Arousal/Alertness: Awake/alert Behavior During Therapy: WFL for tasks assessed/performed Overall Cognitive Status: Impaired/Different from baseline                                 General Comments: oriented to person, place, time (month, year) but not reason for hospitalization; per daughter is much better but not back to his normal              Home Living Family/patient expects to be discharged to:: Inpatient rehab                             Home Equipment: Dan Humphreys - 2 wheels;Walker - 4 wheels      Lives With: Daughter    Prior Functioning/Environment Level of Independence: Needs assistance  Gait / Transfers Assistance Needed: supervision with RW (daughter states he took it with him everywhere--knew he needed it)              OT Problem List: Decreased strength;Decreased range of motion;Impaired balance (sitting and/or standing);Pain      OT Treatment/Interventions: Self-care/ADL training;DME and/or AE instruction;Patient/family education;Balance training    OT Goals(Current goals can be found in the care plan section) Acute Rehab OT Goals Patient Stated Goal: agrees wants to get OOB OT Goal Formulation: With patient Time For Goal Achievement: 02/01/21 Potential to Achieve Goals: Good  OT Frequency: Min 2X/week           Co-evaluation PT/OT/SLP Co-Evaluation/Treatment: Yes Reason for Co-Treatment: For patient/therapist safety;To address functional/ADL transfers PT goals addressed during session: Mobility/safety with mobility;Balance;Strengthening/ROM OT goals addressed during session: Strengthening/ROM;ADL's and self-care      AM-PAC OT "6 Clicks" Daily Activity     Outcome Measure Help from another person eating meals?: A Little Help from another person taking care of personal grooming?: A Little Help from another person toileting, which includes using toliet, bedpan, or urinal?: A Lot Help from another person bathing  (including washing, rinsing, drying)?: A Lot Help from another person to put on and taking off regular upper body clothing?: A Little Help from another person to put on and taking off regular lower body clothing?: A Lot 6 Click Score: 15   End of Session Equipment Utilized During Treatment: Gait belt;Rolling walker  Activity Tolerance: Patient tolerated treatment well Patient left: in chair;with call bell/phone within reach;with chair alarm set  OT Visit Diagnosis: Unsteadiness on feet (R26.81);Other abnormalities of gait and mobility (R26.89);Muscle weakness (generalized) (M62.81);Pain Pain - part of body:  (back)                Time: 1884-1660 OT Time Calculation (min): 24 min Charges:  OT General Charges $OT Visit: 1 Visit OT Evaluation $OT Eval Moderate Complexity: 1 Mod  Ignacia Palma, OTR/L Acute [facility] Pager [phone] Office [phone]    Evette Georges 01/18/2021, 1:21 PM

## 2021-01-18 NOTE — Progress Notes (Signed)
PROGRESS NOTE    Mark Moore  HYQ:[id number] DOB: 07/11/1929 DOA: 01/17/2021 PCP: Wanda Plump, MD    Brief Narrative:  87 y.o. male with medical history significant of afib and HLD presenting with a fall.  His wife died in 05-17-2023 and he was hospitalized in January in [place] for FTT.  His family moved him to the [place] in April and he lived with his daughter for a few weeks prior to transitioning to [facility]F. Pt was recently treated for UTI with bactrim and also started on Wellbutrin for depression. Pt noted to be increasingly confused over the last week but it acutely worsened 2 days prior to admit.   Assessment & Plan:   Principal Problem:   Fall (on)(from) sidewalk curb, initial encounter Active Problems:   Acute metabolic encephalopathy   Atrial fibrillation, chronic (HCC)   Dementia (HCC)   Depression   Dyslipidemia   Glaucoma   Thoracic compression fracture, closed, initial encounter (HCC)   DNR (do not resuscitate)  Fall -Patient noted to have wandered away from his ALF and was found down outside the facility -Cleared from a trauma standpoint other than thoracic compression fracture (see below) -Therapy recs for CIR. Per CIR, would benefit from SNF for [facility]   Acute metabolic encephalopathy -Patient presenting with encephalopathy as evidenced by his confusion -Noted to have underlying mild dementia PTA -Suspected encephalopathy related to recent Wellbutrin -Wellbutrin on hold at presentation -Mentation today is much improved per family   Dementia -Not previously formally diagnosed, but has reported issues with sundowning while hospitalized in January  -Suspect underlying cognitive impairment which is being increasingly unmasked -Seen by PT/OT. Plan for SNF   Afib -Remains rate controlled with Diltiazem -Continued on Pradaxa for [facility]   Depression -He was previously on Lexapro at PCP f/u -Recently on wellbutrin and lexapro, currently on hold    HLD -Continue Lipitor   Glaucoma -Continue Alphagan, Trusopt   T-spine compression fracture -Neurosurgery consulted -Per Neurosurgery, no acute neurosurgical intervention indicated at this time, no brace needed, activity as tolerated without restrictions   DNR -Code status confirmed to be DNR at time of presentation   DVT prophylaxis: dabigatran Code Status: DNR Family Communication: Pt in room, family is at bedside  Status is: Observation  The patient will require care spanning > 2 midnights and should be moved to inpatient because: Inpatient level of care appropriate due to severity of illness  Dispo: The patient is from: ALF              Anticipated d/c is to: SNF              Patient currently is not medically stable to d/c.   Difficult to place patient No       Consultants:  Neurosurgery  Procedures:    Antimicrobials: Anti-infectives (From admission, onward)    None       Subjective: Without complaints  Objective: Vitals:   01/18/21 0020 01/18/21 1513 01/18/21 1605 01/18/21 1605  BP: (!) 159/96 (!) 117/105 (!) 93/54 (!) 93/54  Pulse: (!) 113 (!) 117 73 73  Resp: 20 20 20    Temp: 98.6 F (37 C) 97.9 F (36.6 C) 97.6 F (36.4 C) 97.6 F (36.4 C)  TempSrc: Oral  Axillary   SpO2: 95% 95% 97% 99%  Weight:      Height:        Intake/Output Summary (Last 24 hours) at 01/18/2021 1733 Last data filed at 01/18/2021  1643 Gross per 24 hour  Intake 890 ml  Output 2200 ml  Net -1310 ml   Filed Weights   01/17/21 0722  Weight: 81.6 kg    Examination: General exam: Conversant, in no acute distress Respiratory system: normal chest rise, clear, no audible wheezing Cardiovascular system: regular rhythm, s1-s2 Gastrointestinal system: Nondistended, nontender, pos BS Central nervous system: No seizures, no tremors Extremities: No cyanosis, no joint deformities Skin: No rashes, no pallor Psychiatry: Affect normal // no auditory hallucinations    Data Reviewed: I have personally reviewed following labs and imaging studies  CBC: Recent Labs  Lab 01/17/21 0725 01/17/21 0733 01/18/21 0341  WBC 8.2  --  9.6  HGB 12.2* 12.2* 12.1*  HCT 37.2* 36.0* 35.2*  MCV 98.9  --  94.1  PLT 276  --  259   Basic Metabolic Panel: Recent Labs  Lab 01/17/21 0725 01/17/21 0733 01/18/21 0341  NA 137 138 140  K 3.9 3.7 2.7*  CL 105 106 103  CO2 20*  --  22  GLUCOSE 104* 103* 71  BUN 48* 53* 28*  CREATININE 1.44* 1.40* 1.21  CALCIUM 9.6  --  9.8   GFR: Estimated Creatinine Clearance: 41.4 mL/min (by C-G formula based on SCr of 1.21 mg/dL). Liver Function Tests: Recent Labs  Lab 01/17/21 0725  AST 40  ALT 24  ALKPHOS 67  BILITOT 1.0  PROT 6.1*  ALBUMIN 3.9   No results for input(s): LIPASE, AMYLASE in the last 168 hours. No results for input(s): AMMONIA in the last 168 hours. Coagulation Profile: Recent Labs  Lab 01/17/21 0725  INR 1.6*   Cardiac Enzymes: Recent Labs  Lab 01/17/21 0725  CKTOTAL 107   BNP (last 3 results) No results for input(s): PROBNP in the last 8760 hours. HbA1C: No results for input(s): HGBA1C in the last 72 hours. CBG: No results for input(s): GLUCAP in the last 168 hours. Lipid Profile: No results for input(s): CHOL, HDL, LDLCALC, TRIG, CHOLHDL, LDLDIRECT in the last 72 hours. Thyroid Function Tests: No results for input(s): TSH, T4TOTAL, FREET4, T3FREE, THYROIDAB in the last 72 hours. Anemia Panel: No results for input(s): VITAMINB12, FOLATE, FERRITIN, TIBC, IRON, RETICCTPCT in the last 72 hours. Sepsis Labs: Recent Labs  Lab 01/17/21 0725  LATICACIDVEN 3.8*    Recent Results (from the past 240 hour(s))  Resp Panel by RT-PCR (Flu A&B, Covid) Nasopharyngeal Swab     Status: None   Collection Time: 01/17/21  7:25 AM   Specimen: Nasopharyngeal Swab; Nasopharyngeal(NP) swabs in vial transport medium  Result Value Ref Range Status   SARS Coronavirus 2 by RT PCR NEGATIVE NEGATIVE  Final    Comment: (NOTE) SARS-CoV-2 target nucleic acids are NOT DETECTED.  The SARS-CoV-2 RNA is generally detectable in upper respiratory specimens during the acute phase of infection. The lowest concentration of SARS-CoV-2 viral copies this assay can detect is 138 copies/mL. A negative result does not preclude SARS-Cov-2 infection and should not be used as the sole basis for treatment or other patient management decisions. A negative result may occur with  improper specimen collection/handling, submission of specimen other than nasopharyngeal swab, presence of viral mutation(s) within the areas targeted by this assay, and inadequate number of viral copies(<138 copies/mL). A negative result must be combined with clinical observations, patient history, and epidemiological information. The expected result is Negative.  Fact Sheet for Patients:  BloggerCourse.com  Fact Sheet for Healthcare Providers:  SeriousBroker.it  This test is no t yet approved or  cleared by the [place] and  has been authorized for detection and/or diagnosis of SARS-CoV-2 by FDA under an Emergency Use Authorization (EUA). This EUA will remain  in effect (meaning this test can be used) for the duration of the COVID-19 declaration under Section 564(b)(1) of the Act, 21 U.S.C.section 360bbb-3(b)(1), unless the authorization is terminated  or revoked sooner.       Influenza A by PCR NEGATIVE NEGATIVE Final   Influenza B by PCR NEGATIVE NEGATIVE Final    Comment: (NOTE) The Xpert Xpress SARS-CoV-2/FLU/RSV plus assay is intended as an aid in the diagnosis of influenza from Nasopharyngeal swab specimens and should not be used as a sole basis for treatment. Nasal washings and aspirates are unacceptable for Xpert Xpress SARS-CoV-2/FLU/RSV testing.  Fact Sheet for Patients: BloggerCourse.com  Fact Sheet for Healthcare  Providers: SeriousBroker.it  This test is not yet approved or cleared by the [place] FDA and has been authorized for detection and/or diagnosis of SARS-CoV-2 by FDA under an Emergency Use Authorization (EUA). This EUA will remain in effect (meaning this test can be used) for the duration of the COVID-19 declaration under Section 564(b)(1) of the Act, 21 U.S.C. section 360bbb-3(b)(1), unless the authorization is terminated or revoked.  Performed at [facility], [address], [place], [place] [address]      Radiology Studies: DG Knee 2 Views Left  Result Date: 01/17/2021 CLINICAL DATA:  Pain post fall. EXAM: LEFT KNEE - 1-2 VIEW COMPARISON:  None. FINDINGS: No evidence of fracture, dislocation, or joint effusion. No evidence of arthropathy or other focal bone abnormality. Mild suprapatellar soft tissue edema. IMPRESSION: Negative. Electronically Signed   By: Ted Mcalpine M.D.   On: 01/17/2021 11:02   DG Knee 2 Views Right  Result Date: 01/17/2021 CLINICAL DATA:  Patient status post fall. EXAM: RIGHT KNEE - 1-2 VIEW COMPARISON:  None. FINDINGS: Normal anatomic alignment. No evidence for acute fracture or dislocation. Regional soft tissues unremarkable. Vascular calcifications. IMPRESSION: No acute osseous abnormality. Electronically Signed   By: Annia Belt M.D.   On: 01/17/2021 11:03   CT HEAD WO CONTRAST  Result Date: 01/17/2021 CLINICAL DATA:  87 year old male found down. On anticoagulation. EXAM: CT HEAD WITHOUT CONTRAST TECHNIQUE: Contiguous axial images were obtained from the base of the skull through the vertex without intravenous contrast. COMPARISON:  None. FINDINGS: Brain: No midline shift, ventriculomegaly, mass effect, evidence of mass lesion, intracranial hemorrhage or evidence of cortically based acute infarction. Patchy and confluent fairly symmetric bilateral cerebral white matter hypodensity. Small area of chronic appearing  cortical encephalomalacia also in the anterior right temporal lobe tip (sagittal image 17). Elsewhere preserved gray-white matter differentiation. No other cortical encephalomalacia identified. Vascular: Calcified atherosclerosis at the skull base. No suspicious intracranial vascular hyperdensity. Skull: No fracture identified. Sinuses/Orbits: Left middle ear and mastoids are opacified. And there is chronic right posterior ethmoid and bilateral sphenoid sinus disease with mucoperiosteal thickening. Other sinuses, right tympanic cavity and mastoids are clear. Other: Right forehead scalp hematoma is broad-based, 6 mm in thickness. Visible orbits soft tissues appear to remain normal. No scalp soft tissue gas identified. IMPRESSION: 1. Right forehead scalp hematoma without underlying skull fracture. 2. No acute intracranial abnormality. Moderately advanced bilateral white matter changes most commonly due to small vessel disease. Chronic encephalomalacia at the right temporal lobe tip, might be sequelae of remote trauma. 3. Chronic paranasal sinus disease. Left middle ear and mastoid opacification are likely inflammatory, such as otitis media or  cholesteatoma. Electronically Signed   By: Odessa FlemingH  Hall M.D.   On: 01/17/2021 08:55   CT CERVICAL SPINE WO CONTRAST  Result Date: 01/17/2021 CLINICAL DATA:  87 year old male found down. On anticoagulation. EXAM: CT CERVICAL SPINE WITHOUT CONTRAST TECHNIQUE: Multidetector CT imaging of the cervical spine was performed without intravenous contrast. Multiplanar CT image reconstructions were also generated. COMPARISON:  Head CT today. FINDINGS: Alignment: Mild straightening of cervical lordosis. Cervicothoracic junction alignment is within normal limits. Bilateral posterior element alignment is within normal limits. Skull base and vertebrae: Visualized skull base is intact. No atlanto-occipital dissociation. Maintained C1-C2 alignment, in those levels appear intact. No acute osseous  abnormality identified. Soft tissues and spinal canal: No prevertebral fluid or swelling. No visible canal hematoma. Bulky left carotid calcified atherosclerosis in the neck. Disc levels: Upper cervical facet degeneration, possible developing facet ankylosis on the right at C2-C3. Widespread cervical disc bulging and mild endplate spurring. Suspect mild degenerative spinal stenosis at C4-C5 when combined with ligament flavum hypertrophy. Upper chest: Visible upper thoracic levels appear intact. Negative lung apices. Partially visible calcified aortic arch atherosclerosis. Small volume retained secretions along the left wall of the trachea at the thoracic inlet. Other: Negative visible posterior fossa. IMPRESSION: 1. No acute traumatic injury identified in the cervical spine. 2. Cervical spine degeneration with suspected mild spinal stenosis at C4-C5. 3. Calcified left carotid atherosclerosis in the neck. 4. Aortic Atherosclerosis (ICD10-I70.0). Electronically Signed   By: Odessa FlemingH  Hall M.D.   On: 01/17/2021 09:22   CT CHEST ABDOMEN PELVIS W CONTRAST  Result Date: 01/17/2021 CLINICAL DATA:  87 year old male found down.  On anticoagulation. EXAM: CT CHEST, ABDOMEN, AND PELVIS WITH CONTRAST TECHNIQUE: Multidetector CT imaging of the chest, abdomen and pelvis was performed following the standard protocol during bolus administration of intravenous contrast. CONTRAST:  100mL OMNIPAQUE IOHEXOL 300 MG/ML  SOLN COMPARISON:  Portable chest x-ray today. FINDINGS: CT CHEST FINDINGS Cardiovascular: Tortuous thoracic aorta with calcified atherosclerosis. Extensive coronary artery calcified plaque or stents. Major vascular structures in the chest appear intact. Central pulmonary arteries appear patent. Cardiomegaly with no pericardial effusion. Mediastinum/Nodes: Negative. No mediastinal hematoma or lymphadenopathy. Lungs/Pleura: Trace retained secretions in the trachea below the thoracic inlet. Otherwise the major airways are  patent. Mild dependent opacity in the lungs, greater on the left, most resembles atelectasis. No pneumothorax or pleural effusion. Superimposed mild right middle lobe lateral segment scarring including a small triangular nodule in the minor fissure which is probably an intrapleural lymph node (series 4, image 43). No pneumothorax. No pleural effusion. No convincing pulmonary contusion. Musculoskeletal: T12 superior endplate compression fracture appears acute with subchondral lucency. The T12 pedicles and posterior elements appear intact. There is no significant retropulsion. 35% loss of height. Other thoracic vertebrae appear intact. Intact sternum. Visible shoulder osseous structures appear intact. Chronic appearing posterior left 8th and 9th rib fractures. No convincing acute rib fracture. CT ABDOMEN PELVIS FINDINGS Hepatobiliary: Small circumscribed low-density area in the central liver on series 4, image 47 does not appear traumatic and is probably a benign cyst. Elsewhere the liver and gallbladder are within normal limits. Pancreas: Negative. Spleen: The spleen appears intact.  No perisplenic fluid. Adrenals/Urinary Tract: Normal adrenal glands. Symmetric renal enhancement and contrast excretion. Benign left renal parapelvic cyst. Left midpole nephrolithiasis. Decompressed proximal ureters. Unremarkable bladder. Numerous pelvic phleboliths. Stomach/Bowel: Gas and liquid stool in the rectum. Redundant sigmoid colon which tracks into the right upper quadrant. Redundant transverse colon and flexures containing gas. Retained stool in the right  colon. And the cecum is on a lax mesentery in the mid abdomen. No large bowel inflammation. Appendix not delineated. No dilated small bowel. Decompressed stomach and duodenum. No free air or free fluid identified. Vascular/Lymphatic: Aortoiliac soft and calcified atherosclerosis. Tortuous infrarenal abdominal aorta. Major arterial structures remain patent. Portal venous  timing is early. On delayed images the portal venous system appears to be patent. No lymphadenopathy. Reproductive: Negative. Other: No pelvic free fluid. Musculoskeletal: L4 superior endplate compression fracture with 35% loss of vertebral body height. L4 pedicles and posterior elements appear intact. No significant retropulsion. This appears age indeterminate. Superimposed grade 1 spondylolisthesis of L4 on L5. Other lumbar levels appear intact. Sacrum, SI joints, pelvis and proximal femurs are intact. IMPRESSION: 1. Acute T12 compression fractures with 35% loss of height. No significant retropulsion or complicating features. 2. Age indeterminate L4 compression fracture with similar loss of height, no retropulsion. Underlying chronic degenerative appearing grade 1 spondylolisthesis at L4-L5. 3. No other acute traumatic injury identified in the chest, abdomen, or pelvis. 4. Cardiomegaly. Extensive coronary artery and Aortic Atherosclerosis (ICD10-I70.0). Left nephrolithiasis. Redundant large bowel. Electronically Signed   By: Odessa Fleming M.D.   On: 01/17/2021 09:08   DG Chest Port 1 View  Result Date: 01/17/2021 CLINICAL DATA:  Male of unknown age status post fall. Found down. On anticoagulation. EXAM: PORTABLE CHEST 1 VIEW COMPARISON:  None. FINDINGS: Portable AP supine view at 0717 hours. Mildly tortuous thoracic aorta with calcified atherosclerosis. Other mediastinal contours are within normal limits. Visualized tracheal air column is within normal limits. Allowing for portable technique the lungs are clear. No pneumothorax or pleural effusion identified on this supine view. Abundant gas-filled but nondilated bowel loops in the upper abdomen. No displaced rib fracture identified. No acute osseous abnormality identified. IMPRESSION: No acute cardiopulmonary abnormality or acute traumatic injury identified. Electronically Signed   By: Odessa Fleming M.D.   On: 01/17/2021 07:47    Scheduled Meds:  atorvastatin  10 mg  Oral Daily   brimonidine  1 drop Both Eyes TID   dabigatran  150 mg Oral Q12H   diltiazem  30 mg Oral BID   docusate sodium  100 mg Oral BID   dorzolamide  1 drop Both Eyes BID   finasteride  5 mg Oral Daily   pantoprazole  40 mg Oral Daily   Continuous Infusions:  lactated ringers 75 mL/hr at 01/18/21 1608     LOS: 0 days   Rickey Barbara, MD [facility] Pager On Amion  If 7PM-7AM, please contact night-coverage 01/18/2021, 5:33 PM

## 2021-01-18 NOTE — Discharge Instructions (Signed)

## 2021-01-18 NOTE — Plan of Care (Signed)

## 2021-01-18 NOTE — Plan of Care (Signed)
  Problem: Clinical Measurements: Goal: Cardiovascular complication will be avoided Outcome: Progressing   Problem: Elimination: Goal: Will not experience complications related to bowel motility Outcome: Progressing   Problem: Elimination: Goal: Will not experience complications related to urinary retention Outcome: Progressing

## 2021-01-19 LAB — COMPREHENSIVE METABOLIC PANEL
ALT: 18 U/L (ref 0–44)
AST: 32 U/L (ref 15–41)
Albumin: 3.1 g/dL — ABNORMAL LOW (ref 3.5–5.0)
Alkaline Phosphatase: 64 U/L (ref 38–126)
Anion gap: 6 (ref 5–15)
BUN: 22 mg/dL (ref 8–23)
CO2: 27 mmol/L (ref 22–32)
Calcium: 9.4 mg/dL (ref 8.9–10.3)
Chloride: 108 mmol/L (ref 98–111)
Creatinine, Ser: 1.12 mg/dL (ref 0.61–1.24)
GFR, Estimated: >60 mL/min (ref >60)
Glucose, Bld: 101 mg/dL — ABNORMAL HIGH (ref 70–99)
Potassium: 3.2 mmol/L — ABNORMAL LOW (ref 3.5–5.1)
Sodium: 141 mmol/L (ref 135–145)
Total Bilirubin: 0.9 mg/dL (ref 0.3–1.2)
Total Protein: 5.5 g/dL — ABNORMAL LOW (ref 6.5–8.1)

## 2021-01-19 LAB — CBC
HCT: 33.4 % — ABNORMAL LOW (ref 39.0–52.0)
Hemoglobin: 11.5 g/dL — ABNORMAL LOW (ref 13.0–17.0)
MCH: 32.8 pg (ref 26.0–34.0)
MCHC: 34.4 g/dL (ref 30.0–36.0)
MCV: 95.2 fL (ref 80.0–100.0)
Platelets: 227 10*3/uL (ref 150–400)
RBC: 3.51 MIL/uL — ABNORMAL LOW (ref 4.22–5.81)
RDW: 15.3 % (ref 11.5–15.5)
WBC: 8.1 10*3/uL (ref 4.0–10.5)
nRBC: 0 % (ref 0.0–0.2)

## 2021-01-19 MED ORDER — SODIUM CHLORIDE 0.9 % IV SOLN
1.0000 g | INTRAVENOUS | Status: DC
  Administered 2021-01-19: 1 g via INTRAVENOUS
  Filled 2021-01-19: qty 1
  Filled 2021-01-19: qty 10

## 2021-01-19 MED ORDER — POTASSIUM CHLORIDE CRYS ER 20 MEQ PO TBCR
40.0000 meq | EXTENDED_RELEASE_TABLET | Freq: Four times a day (QID) | ORAL | Status: AC
  Administered 2021-01-19 (×2): 40 meq via ORAL
  Filled 2021-01-19 (×2): qty 2

## 2021-01-19 NOTE — Plan of Care (Signed)
  Problem: Elimination: Goal: Will not experience complications related to urinary retention Outcome: Progressing   Problem: Pain Managment: Goal: General experience of comfort will improve Outcome: Progressing   Problem: Safety: Goal: Ability to remain free from injury will improve Outcome: Progressing   

## 2021-01-19 NOTE — TOC Progression Note (Addendum)
Transition of Care [facility]) - Progression Note    Patient Details  Name: Mark Moore MRN: [id number] Date of Birth: [birth year]  Transition [facility]) CM/SW Contact  Beckie Busing, RN Phone Number:[phone]  01/19/2021, 3:34 PM  Clinical Narrative:    Patient unable to participate in assessment. CM spoke with daughter [name] who states that patient is from ALF and she understands that he will need a higher level of care. Daughter gives CM consent to initiate SNF bed search. FL2 completed and info has been faxed out via hub.Bed offers pending .   Expected Discharge Plan: Skilled Nursing Facility Barriers to Discharge: Continued Medical Work up  Expected Discharge Plan and Services Expected Discharge Plan: Skilled Nursing Facility In-house Referral: Clinical Social Work     Living arrangements for the past 2 months: Assisted Living Facility                                       Social Determinants of Health (SDOH) Interventions    Readmission Risk Interventions No flowsheet data found.

## 2021-01-19 NOTE — Plan of Care (Signed)

## 2021-01-19 NOTE — Plan of Care (Signed)
  Problem: Education: Goal: Knowledge of General Education information will improve Description Including pain rating scale, medication(s)/side effects and non-pharmacologic comfort measures Outcome: Progressing   

## 2021-01-19 NOTE — TOC CAGE-AID Note (Signed)
Transition of Care [facility]) - CAGE-AID Screening   Patient Details  Name: Mark Moore MRN: [id number] Date of Birth: 12/24/1928  Transition of Care [facility]) CM/SW Contact:    Erin Sons, LCSW Phone Number: 01/19/2021, 11:19 AM   Clinical Narrative:  Pt unable to participate due to mental status  CAGE-AID Screening: Substance Abuse Screening unable to be completed due to: : Patient unable to participate

## 2021-01-19 NOTE — NC FL2 (Signed)
[place] MEDICAID FL2 LEVEL OF CARE SCREENING TOOL     IDENTIFICATION  Patient Name: Mark Moore Birthdate: 1928-10-04 Sex: male Admission Date (Current Location): 01/17/2021  [facility] and [place] Number:  Producer, television/film/video and Address:  [facility], [address] [place], [place] [address]      Provider Number: [id number]  Attending Physician Name and Address:  Jerald Kief, MD  Relative Name and Phone Number:  Myrle Sheng [phone]    Current Level of Care: SNF Recommended Level of Care: Skilled Nursing Facility Prior Approval Number:    Date Approved/Denied:   PASRR Number:    Discharge Plan: SNF    Current Diagnoses: Patient Active Problem List   Diagnosis Date Noted   Acute encephalopathy 01/18/2021   Acute metabolic encephalopathy 01/17/2021   Atrial fibrillation, chronic (HCC) 01/17/2021   Fall (on)(from) sidewalk curb, initial encounter 01/17/2021   Dementia (HCC) 01/17/2021   Depression 01/17/2021   Dyslipidemia 01/17/2021   Glaucoma 01/17/2021   Thoracic compression fracture, closed, initial encounter (HCC) 01/17/2021   DNR (do not resuscitate) 01/17/2021    Orientation RESPIRATION BLADDER Height & Weight     Self  Normal External catheter Weight: 81.6 kg Height:  5\' 9"  (175.3 cm)  BEHAVIORAL SYMPTOMS/MOOD NEUROLOGICAL BOWEL NUTRITION STATUS     (n/a) Continent Diet (Heart Healthy)  AMBULATORY STATUS COMMUNICATION OF NEEDS Skin   Limited Assist (rolling walker) Does not communicate (requires interpreter but currently is not responding to questions) Skin abrasions, Bruising, Other (Comment) (abrasion/ blister/ecchymosis (eye/knee/shoulder))                       Personal Care Assistance Level of Assistance  Bathing, Feeding, Dressing, Total care Bathing Assistance: Limited assistance Feeding assistance: Limited assistance Dressing Assistance: Limited assistance Total Care Assistance: Limited  assistance   Functional Limitations Info  Sight, Hearing, Speech Sight Info: Impaired Hearing Info: Adequate Speech Info: Adequate    SPECIAL CARE FACTORS FREQUENCY  PT (By licensed PT), OT (By licensed OT)     PT Frequency: 5X OT Frequency: 5X            Contractures Contractures Info: Not present    Additional Factors Info  Code Status, Allergies, Psychotropic, Insulin Sliding Scale, Isolation Precautions, Suctioning Needs Code Status Info: DNR Allergies Info: Wellbutrin Psychotropic Info: see d/c summary Insulin Sliding Scale Info: see d/c summary for sliding scale info Isolation Precautions Info: None Suctioning Needs: n/a   Current Medications (01/19/2021):  This is the current hospital active medication list Current Facility-Administered Medications  Medication Dose Route Frequency Provider Last Rate Last Admin   acetaminophen (TYLENOL) tablet 650 mg  650 mg Oral Q6H PRN 01/21/2021, MD   650 mg at 01/19/21 1345   Or   acetaminophen (TYLENOL) suppository 650 mg  650 mg Rectal Q6H PRN 01/21/21, MD       atorvastatin (LIPITOR) tablet 10 mg  10 mg Oral Daily Jonah Blue, MD   10 mg at 01/19/21 0931   bisacodyl (DULCOLAX) EC tablet 5 mg  5 mg Oral Daily PRN 01/21/21, MD       brimonidine (ALPHAGAN) 0.2 % ophthalmic solution 1 drop  1 drop Both Eyes TID Jonah Blue, MD   1 drop at 01/19/21 0937   cefTRIAXone (ROCEPHIN) 1 g in sodium chloride 0.9 % 100 mL IVPB  1 g Intravenous Q24H 01/21/21, MD 200 mL/hr at 01/19/21 1408 1 g  at 01/19/21 1408   dabigatran (PRADAXA) capsule 150 mg  150 mg Oral Q12H Norva Pavlov, RPH   150 mg at 01/19/21 0944   diltiazem (CARDIZEM) tablet 30 mg  30 mg Oral BID Jonah Blue, MD   30 mg at 01/19/21 0931   docusate sodium (COLACE) capsule 100 mg  100 mg Oral BID Jonah Blue, MD   100 mg at 01/19/21 0931   dorzolamide (TRUSOPT) 2 % ophthalmic solution 1 drop  1 drop Both Eyes BID Jonah Blue, MD    1 drop at 01/19/21 4709   finasteride (PROSCAR) tablet 5 mg  5 mg Oral Daily Jonah Blue, MD   5 mg at 01/19/21 6283   haloperidol lactate (HALDOL) injection 2 mg  2 mg Intravenous Q6H PRN Jonah Blue, MD   2 mg at 01/18/21 0134   hydrALAZINE (APRESOLINE) injection 5 mg  5 mg Intravenous Q4H PRN Jonah Blue, MD       HYDROcodone-acetaminophen (NORCO/VICODIN) 5-325 MG per tablet 1-2 tablet  1-2 tablet Oral Q4H PRN Jonah Blue, MD       lactated ringers infusion   Intravenous Continuous Jonah Blue, MD 75 mL/hr at 01/18/21 1608 New Bag at 01/18/21 1608   morphine 2 MG/ML injection 2 mg  2 mg Intravenous Q2H PRN Jonah Blue, MD   2 mg at 01/18/21 0026   ondansetron (ZOFRAN) tablet 4 mg  4 mg Oral Q6H PRN Jonah Blue, MD       Or   ondansetron [facility]) injection 4 mg  4 mg Intravenous Q6H PRN Jonah Blue, MD       pantoprazole (PROTONIX) EC tablet 40 mg  40 mg Oral Daily Jonah Blue, MD   40 mg at 01/19/21 0931   polyethylene glycol (MIRALAX / GLYCOLAX) packet 17 g  17 g Oral Daily PRN Jonah Blue, MD       potassium chloride SA (KLOR-CON) CR tablet 40 mEq  40 mEq Oral Q6H Jerald Kief, MD   40 mEq at 01/19/21 6629     Discharge Medications: Please see discharge summary for a list of discharge medications.  Relevant Imaging Results:  Relevant Lab Results:   Additional Information SS # [id number]  Beckie Busing, RN

## 2021-01-19 NOTE — TOC Progression Note (Signed)
Transition of Care [facility]) - Progression Note    Patient Details  Name: Mark Moore MRN: [id number] Date of Birth: [birth year]  Transition of Care [facility]) CM/SW Contact  Beckie Busing, RN Phone [phone]  01/19/2021, 9:45 AM  Clinical Narrative:    CM has called [name] to verify that patient came from [facility] and to know if the facility offers SNF. Message has been left for [facility] who can answer the question of SNF availability. Will await return call.    Expected Discharge Plan: Skilled Nursing Facility Barriers to Discharge: Continued Medical Work up  Expected Discharge Plan and Services Expected Discharge Plan: Skilled Nursing Facility In-house Referral: Clinical Social Work     Living arrangements for the past 2 months: Assisted Living Facility                                       Social Determinants of Health (SDOH) Interventions    Readmission Risk Interventions No flowsheet data found.

## 2021-01-19 NOTE — Progress Notes (Signed)
PROGRESS NOTE    Mark Moore  UKG:[id number] DOB: November 24, 1928 DOA: 01/17/2021 PCP: Wanda Plump, MD    Brief Narrative:  87 y.o. male with medical history significant of afib and HLD presenting with a fall.  His wife died in 2023/05/01 and he was hospitalized in January in [place]TT.  His family moved him to the [place] in April and he lived with his daughter for a few weeks prior to transitioning to [facility]. Pt was recently treated for UTI with bactrim and also started on Wellbutrin for depression. Pt noted to be increasingly confused over the last week but it acutely worsened 2 days prior to admit.   Assessment & Plan:   Principal Problem:   Fall (on)(from) sidewalk curb, initial encounter Active Problems:   Acute metabolic encephalopathy   Atrial fibrillation, chronic (HCC)   Dementia (HCC)   Depression   Dyslipidemia   Glaucoma   Thoracic compression fracture, closed, initial encounter (HCC)   DNR (do not resuscitate)   Acute encephalopathy  Fall -Patient noted to have wandered away from his ALF and was found down outside the facility -Cleared from a trauma standpoint other than thoracic compression fracture (see below) -Therapy recs for CIR. Per CIR, would benefit from SNF. TOC following for placement   Acute metabolic encephalopathy -Patient presenting with encephalopathy as evidenced by his confusion -Pt is suspected to have underlying mild dementia PTA, although not yet diagnosed -Initially thought to have encephalopathy related to recent Wellbutrin with improvement in mentation when held -today pt appears confused -UA is suggestive of UTI. Have started rocephin. Urine culture pending   Possible Dementia -Not previously formally diagnosed, but has reported issues with sundowning while hospitalized in January  -Suspect underlying cognitive impairment which is being increasingly unmasked -Seen by PT/OT. Plan for SNF   Afib -Remains rate  controlled with Diltiazem -Continued on Pradaxa for [facility]   Depression -He was previously on Lexapro at PCP f/u -Recently on wellbutrin and lexapro, currently on hold per above   HLD -Continue Lipitor   Glaucoma -Continue Alphagan, Trusopt   T-spine compression fracture -Neurosurgery consulted -Per Neurosurgery, no acute neurosurgical intervention indicated at this time, no brace needed, activity as tolerated without restrictions   DNR -Code status confirmed to be DNR at time of presentation   DVT prophylaxis: dabigatran Code Status: DNR Family Communication: Pt in room, family at bedside  Status is: Inpatient   Inpatient level of care appropriate due to severity of illness  Dispo: The patient is from: ALF              Anticipated d/c is to: SNF              Patient currently is not medically stable to d/c.   Difficult to place patient No  Consultants:  Neurosurgery  Procedures:    Antimicrobials: Anti-infectives (From admission, onward)    Start     Dose/Rate Route Frequency Ordered Stop   01/19/21 1315  cefTRIAXone (ROCEPHIN) 1 g in sodium chloride 0.9 % 100 mL IVPB        1 g 200 mL/hr over 30 Minutes Intravenous Every 24 hours 01/19/21 1223         Subjective: Confused this AM, unable to assess  Objective: Vitals:   01/18/21 2123 01/19/21 0347 01/19/21 0710 01/19/21 1625  BP: 112/66 135/84  (!) 81/56  Pulse: 72 90  (!) 104  Resp: 18 20  20   Temp: 98 F (  36.7 C) 97.8 F (36.6 C)  100.3 F (37.9 C)  TempSrc: Oral Axillary    SpO2: 99% 98% 98% 97%  Weight:      Height:       No intake or output data in the 24 hours ending 01/19/21 1714  Filed Weights   01/17/21 0722  Weight: 81.6 kg    Examination: General exam: Awake, laying in bed, in nad Respiratory system: Normal respiratory effort, no wheezing Cardiovascular system: regular rate, s1, s2 Gastrointestinal system: Soft, nondistended, positive BS Central nervous system: CN2-12 grossly  intact, strength intact Extremities: Perfused, no clubbing Skin: Normal skin turgor, no notable skin lesions seen Psychiatry: Unable to assess given mentation  Data Reviewed: I have personally reviewed following labs and imaging studies  CBC: Recent Labs  Lab 01/17/21 0725 01/17/21 0733 01/18/21 0341 01/19/21 0156  WBC 8.2  --  9.6 8.1  HGB 12.2* 12.2* 12.1* 11.5*  HCT 37.2* 36.0* 35.2* 33.4*  MCV 98.9  --  94.1 95.2  PLT 276  --  259 227    Basic Metabolic Panel: Recent Labs  Lab 01/17/21 0725 01/17/21 0733 01/18/21 0341 01/19/21 0156  NA 137 138 140 141  K 3.9 3.7 2.7* 3.2*  CL 105 106 103 108  CO2 20*  --  22 27  GLUCOSE 104* 103* 71 101*  BUN 48* 53* 28* 22  CREATININE 1.44* 1.40* 1.21 1.12  CALCIUM 9.6  --  9.8 9.4    GFR: Estimated Creatinine Clearance: 44.7 mL/min (by C-G formula based on SCr of 1.12 mg/dL). Liver Function Tests: Recent Labs  Lab 01/17/21 0725 01/19/21 0156  AST 40 32  ALT 24 18  ALKPHOS 67 64  BILITOT 1.0 0.9  PROT 6.1* 5.5*  ALBUMIN 3.9 3.1*    No results for input(s): LIPASE, AMYLASE in the last 168 hours. No results for input(s): AMMONIA in the last 168 hours. Coagulation Profile: Recent Labs  Lab 01/17/21 0725  INR 1.6*    Cardiac Enzymes: Recent Labs  Lab 01/17/21 0725  CKTOTAL 107    BNP (last 3 results) No results for input(s): PROBNP in the last 8760 hours. HbA1C: No results for input(s): HGBA1C in the last 72 hours. CBG: No results for input(s): GLUCAP in the last 168 hours. Lipid Profile: No results for input(s): CHOL, HDL, LDLCALC, TRIG, CHOLHDL, LDLDIRECT in the last 72 hours. Thyroid Function Tests: No results for input(s): TSH, T4TOTAL, FREET4, T3FREE, THYROIDAB in the last 72 hours. Anemia Panel: No results for input(s): VITAMINB12, FOLATE, FERRITIN, TIBC, IRON, RETICCTPCT in the last 72 hours. Sepsis Labs: Recent Labs  Lab 01/17/21 0725  LATICACIDVEN 3.8*     Recent Results (from the past  240 hour(s))  Resp Panel by RT-PCR (Flu A&B, Covid) Nasopharyngeal Swab     Status: None   Collection Time: 01/17/21  7:25 AM   Specimen: Nasopharyngeal Swab; Nasopharyngeal(NP) swabs in vial transport medium  Result Value Ref Range Status   SARS Coronavirus 2 by RT PCR NEGATIVE NEGATIVE Final    Comment: (NOTE) SARS-CoV-2 target nucleic acids are NOT DETECTED.  The SARS-CoV-2 RNA is generally detectable in upper respiratory specimens during the acute phase of infection. The lowest concentration of SARS-CoV-2 viral copies this assay can detect is 138 copies/mL. A negative result does not preclude SARS-Cov-2 infection and should not be used as the sole basis for treatment or other patient management decisions. A negative result may occur with  improper specimen collection/handling, submission of specimen other than  nasopharyngeal swab, presence of viral mutation(s) within the areas targeted by this assay, and inadequate number of viral copies(<138 copies/mL). A negative result must be combined with clinical observations, patient history, and epidemiological information. The expected result is Negative.  Fact Sheet for Patients:  BloggerCourse.com  Fact Sheet for Healthcare Providers:  SeriousBroker.it  This test is no t yet approved or cleared by the [place] FDA and  has been authorized for detection and/or diagnosis of SARS-CoV-2 by FDA under an Emergency Use Authorization (EUA). This EUA will remain  in effect (meaning this test can be used) for the duration of the COVID-19 declaration under Section 564(b)(1) of the Act, 21 U.S.C.section 360bbb-3(b)(1), unless the authorization is terminated  or revoked sooner.       Influenza A by PCR NEGATIVE NEGATIVE Final   Influenza B by PCR NEGATIVE NEGATIVE Final    Comment: (NOTE) The Xpert Xpress SARS-CoV-2/FLU/RSV plus assay is intended as an aid in the diagnosis of influenza  from Nasopharyngeal swab specimens and should not be used as a sole basis for treatment. Nasal washings and aspirates are unacceptable for Xpert Xpress SARS-CoV-2/FLU/RSV testing.  Fact Sheet for Patients: BloggerCourse.com  Fact Sheet for Healthcare Providers: SeriousBroker.it  This test is not yet approved or cleared by the [place] FDA and has been authorized for detection and/or diagnosis of SARS-CoV-2 by FDA under an Emergency Use Authorization (EUA). This EUA will remain in effect (meaning this test can be used) for the duration of the COVID-19 declaration under Section 564(b)(1) of the Act, 21 U.S.C. section 360bbb-3(b)(1), unless the authorization is terminated or revoked.  Performed at [facility], [address] [place], [place] [address]   Culture, Urine     Status: Abnormal (Preliminary result)   Collection Time: 01/17/21  3:15 PM   Specimen: Urine, Random  Result Value Ref Range Status   Specimen Description URINE, RANDOM  Final   Special Requests NONE  Final   Culture (A)  Final    >=100,000 COLONIES/mL GRAM NEGATIVE RODS IDENTIFICATION AND SUSCEPTIBILITIES TO FOLLOW Performed at [facility], [address], [place], [place] [address]    Report Status PENDING  Incomplete      Radiology Studies: No results found.  Scheduled Meds:  atorvastatin  10 mg Oral Daily   brimonidine  1 drop Both Eyes TID   dabigatran  150 mg Oral Q12H   diltiazem  30 mg Oral BID   docusate sodium  100 mg Oral BID   dorzolamide  1 drop Both Eyes BID   finasteride  5 mg Oral Daily   pantoprazole  40 mg Oral Daily   potassium chloride  40 mEq Oral Q6H   Continuous Infusions:  cefTRIAXone (ROCEPHIN)  IV 1 g (01/19/21 1408)   lactated ringers 75 mL/hr at 01/18/21 1608     LOS: 1 day   Rickey Barbara, MD [facility] Pager On Amion  If 7PM-7AM, please contact night-coverage 01/19/2021, 5:14 PM

## 2021-01-20 LAB — COMPREHENSIVE METABOLIC PANEL
ALT: 19 U/L (ref 0–44)
AST: 25 U/L (ref 15–41)
Albumin: 2.8 g/dL — ABNORMAL LOW (ref 3.5–5.0)
Alkaline Phosphatase: 49 U/L (ref 38–126)
Anion gap: 6 (ref 5–15)
BUN: 21 mg/dL (ref 8–23)
CO2: 27 mmol/L (ref 22–32)
Calcium: 9 mg/dL (ref 8.9–10.3)
Chloride: 107 mmol/L (ref 98–111)
Creatinine, Ser: 1.08 mg/dL (ref 0.61–1.24)
GFR, Estimated: >60 mL/min (ref >60)
Glucose, Bld: 106 mg/dL — ABNORMAL HIGH (ref 70–99)
Potassium: 4 mmol/L (ref 3.5–5.1)
Sodium: 140 mmol/L (ref 135–145)
Total Bilirubin: 0.9 mg/dL (ref 0.3–1.2)
Total Protein: 5.1 g/dL — ABNORMAL LOW (ref 6.5–8.1)

## 2021-01-20 LAB — URINE CULTURE: Culture: >=100000 — AB

## 2021-01-20 LAB — CBC
HCT: 31.6 % — ABNORMAL LOW (ref 39.0–52.0)
Hemoglobin: 10.5 g/dL — ABNORMAL LOW (ref 13.0–17.0)
MCH: 32.5 pg (ref 26.0–34.0)
MCHC: 33.2 g/dL (ref 30.0–36.0)
MCV: 97.8 fL (ref 80.0–100.0)
Platelets: 191 10*3/uL (ref 150–400)
RBC: 3.23 MIL/uL — ABNORMAL LOW (ref 4.22–5.81)
RDW: 15.6 % — ABNORMAL HIGH (ref 11.5–15.5)
WBC: 14.1 10*3/uL — ABNORMAL HIGH (ref 4.0–10.5)
nRBC: 0 % (ref 0.0–0.2)

## 2021-01-20 MED ORDER — SODIUM CHLORIDE 0.9 % IV SOLN
1.0000 g | Freq: Two times a day (BID) | INTRAVENOUS | Status: DC
  Administered 2021-01-20 – 2021-01-21 (×3): 1 g via INTRAVENOUS
  Filled 2021-01-20 (×5): qty 1

## 2021-01-20 NOTE — Progress Notes (Signed)
Occupational Therapy Treatment Patient Details Name: Mark Moore MRN: [id number] DOB: 07/30/1929 Today's Date: 01/20/2021    History of present illness 87 y.o. male presented 01/17/21 with a fall and confusion. T12 compression fx (neurosurgery rec no intervention, no brace needed), ?UTI vs side effects of new med;  PMH significant of afib and HLD, wife died in Apr 25, 2023 and hospitalized in January with FTT (in [place] family moved him to ALF in [place]; dementia   OT comments  Pt making gradual progress towards OT goals this session. Pts daughter present during session interpreting session. Pt continues to present with cognitive deficits, impaired balance and generalized deconditioning. Pt currently requires MINA  +1 for functional ADL transfers with RW with MOD cues for safety and body awareness during transfers, MOD A for UB ADLS and MAX A for LB ADLs. Pt with impaired safety awareness with pt noted to be having BM but unable to articulate need. Pts daughter aware of CIR denial and agreeable to pursue ST SNF placement, therefore updated DC recs as indicated below. Will follow acutely per POC.    Follow Up Recommendations  Supervision/Assistance - 24 hour;SNF    Equipment Recommendations  Other (comment) (TBD at next venue of care)    Recommendations for Other Services      Precautions / Restrictions Precautions Precautions: Fall Precaution Comments: back precautions for comfort Restrictions Weight Bearing Restrictions: No       Mobility Bed Mobility Overal bed mobility: Needs Assistance Bed Mobility: Rolling;Sidelying to Sit Rolling: Min assist;Min guard Sidelying to sit: Min assist       General bed mobility comments: pt able to roll R<>L for pericare with initial minA progressing to min guard with MAX cues, pt transitioned into sitting via log roll tehcnique needed MOD cues for sequence technique but only MINA to elevate trunk into sitting    Transfers Overall  transfer level: Needs assistance Equipment used: Rolling walker (2 wheeled) Transfers: Sit to/from Stand Sit to Stand: Min assist         General transfer comment: MINA from EOB and BSC to power into standing, pt preferring to pull up on RW needing cues for hand placement    Balance Overall balance assessment: Needs assistance Sitting-balance support: No upper extremity supported;Feet supported Sitting balance-Leahy Scale: Fair Sitting balance - Comments: supervision for safety   Standing balance support: Bilateral upper extremity supported Standing balance-Leahy Scale: Poor Standing balance comment: bil UE support via RW                           ADL either performed or assessed with clinical judgement   ADL Overall ADL's : Needs assistance/impaired                 Upper Body Dressing : Moderate assistance;Sitting Upper Body Dressing Details (indicate cue type and reason): to don new gown, d/t cog     Toilet Transfer: Minimal assistance;RW;Stand-pivot;BSC Toilet Transfer Details (indicate cue type and reason): MIN A for SPT from EOB >BSC, cues for body awareness and sequencing Toileting- Clothing Manipulation and Hygiene: Maximal assistance;Sit to/from stand;Bed level Toileting - Clothing Manipulation Details (indicate cue type and reason): posterior pericare in standing from EOB and from bed level     Functional mobility during ADLs: Minimal assistance;Cueing for sequencing;Rolling walker;Cueing for safety General ADL Comments: pt continues to present with impaired cognition, impaired balance and decreased coordination and strength     Vision  Perception     Praxis      Cognition Arousal/Alertness: Awake/alert Behavior During Therapy: WFL for tasks assessed/performed;Restless;Impulsive (mildly impulsive at times and restless when attempting to tell OTA that he needed to have BM) Overall Cognitive Status: History of cognitive impairments - at  baseline                                 General Comments: daughter present interpreting session reporting pt perseverating on making sure his daughter is taken care of. pt able to state he was in the hospital and his name but unable to state reason for hospitlizaion. pt restless at times noted to be actively having BM impulsively trying to stand from bed        Exercises     Shoulder Instructions       General Comments pts daughter present during session, interpreting session    Pertinent Vitals/ Pain       Pain Assessment: Faces Faces Pain Scale: Hurts a little bit Pain Location: buttock during pericare Pain Descriptors / Indicators: Sore Pain Intervention(s): Monitored during session;Limited activity within patient's tolerance  Home Living                                          Prior Functioning/Environment              Frequency  Min 2X/week        Progress Toward Goals  OT Goals(current goals can now be found in the care plan section)  Progress towards OT goals: Progressing toward goals  Acute Rehab OT Goals OT Goal Formulation: Patient unable to participate in goal setting Time For Goal Achievement: 02/01/21 Potential to Achieve Goals: Fair  Plan Discharge plan needs to be updated;Frequency needs to be updated    Co-evaluation                 AM-PAC OT "6 Clicks" Daily Activity     Outcome Measure   Help from another person eating meals?: A Little Help from another person taking care of personal grooming?: A Little Help from another person toileting, which includes using toliet, bedpan, or urinal?: A Lot Help from another person bathing (including washing, rinsing, drying)?: A Lot Help from another person to put on and taking off regular upper body clothing?: A Lot Help from another person to put on and taking off regular lower body clothing?: A Lot 6 Click Score: 14    End of Session Equipment Utilized  During Treatment: Gait belt;Rolling walker  OT Visit Diagnosis: Unsteadiness on feet (R26.81);Other abnormalities of gait and mobility (R26.89);Muscle weakness (generalized) (M62.81);Pain Pain - part of body:  (buttock)   Activity Tolerance Patient tolerated treatment well   Patient Left Other (comment)   Nurse Communication Mobility status;Other (comment) (tolerance to session, pt having frequent BMs)        Time: 1610-9604 OT Time Calculation (min): 41 min  Charges: OT General Charges $OT Visit: 1 Visit OT Treatments $Self Care/Home Management : 38-52 mins  Lenor Derrick., COTA/L Acute Rehabilitation Services [phone]    Barron Schmid 01/20/2021, 9:43 AM

## 2021-01-20 NOTE — Progress Notes (Signed)
  Speech Language Pathology Treatment: Cognitive-Linquistic  Patient Details Name: Mark Moore MRN: [id number] DOB: [birth year] Today's Date: 01/20/2021 Time: 9528-4132 SLP Time Calculation (min) (ACUTE ONLY): 25 min  Assessment / Plan / Recommendation Clinical Impression  Pt now alert, but demonstrates agitation, poor problem solving, word finding impairment and distractibility all consistent with probable dementia, worsened from baseline. Pt is fearful and agitated in this unfamiliar setting. He is unable to sequence or problem solve basic tasks like self feeding but benefits from eliminating distractions and guidance with one step verbal commands. When making requests he has circumlocutions and paraphasias. For example when given a towel to use with his meal he said "no I want the small one" so a wash cloth was given, but he appeared disappointed. Then was presented with a napkin and said "Paper! That's what I wanted." Discussed condition with pts daughter. Pt would benefit from rehabilitation, but will likely need a memory care like environment into the future. Recommend skilled nursing rehabilitation at this time.    HPI HPI: Mark Moore is a 87 y.o. male with medical history significant of afib and HLD presenting with a fall.  His wife died in 2023/05/10 and he was hospitalized in January in [place] for FTT.  His family moved him to the [place] in April and he lived with his daughter for a few weeks prior to transitioning to [facility] (not memory care).  He has had some memory issues but not clearly dementia.  His has been more confusion, hallucinations than actual memory issues.  This was worsening and so last week the facility tested his urine (no urinary symptoms, just the confusion) and diagnosed a UTI; he was started on Bactrim.  He also started taking Wellbutrin about 9 days ago due to depression/prolonged grief.  He has been increasingly confused over the last week  but it acutely worsened 2 days ago.  Overnight, he was found outside his facility, half-dressed and having fallen.  In the ER, he has been confused with non-sensical speech.      SLP Plan  Continue with current plan of care       Recommendations                   Follow up Recommendations: Skilled Nursing facility SLP Visit Diagnosis: Cognitive communication deficit (G40.102) Plan: Continue with current plan of care       GO               Harlon Ditty, MA CCC-SLP  Acute Rehabilitation Services Pager [phone] Office [phone]   Mark Moore 01/20/2021, 1:53 PM

## 2021-01-20 NOTE — Progress Notes (Signed)
PROGRESS NOTE    Mark Moore  ZOX:[id number] DOB: 02/23/1929 DOA: 01/17/2021 PCP: Wanda PlumpPaz, Jose E, MD    Brief Narrative:  87 y.o. male with medical history significant of afib and HLD presenting with a fall.  His wife died in September and he was hospitalized in January in [place] for FTT.  His [place] the [place] in [place] and he lived with his daughter for a few weeks prior to transitioning to [facility]. Pt was recently treated for UTI with bactrim and also started on Wellbutrin for depression. Pt noted to be increasingly confused over the last week but it acutely worsened 2 days prior to admit.   Assessment & Plan:   Principal Problem:   Fall (on)(from) sidewalk curb, initial encounter Active Problems:   Acute metabolic encephalopathy   Atrial fibrillation, chronic (HCC)   Dementia (HCC)   Depression   Dyslipidemia   Glaucoma   Thoracic compression fracture, closed, initial encounter (HCC)   DNR (do not resuscitate)   Acute encephalopathy  ESBL Klebsiella UTI, POA --started on ceftriaxone on presentation --switch to meropenem today  Fall -Patient noted to have wandered away from his ALF and was found down outside the facility -Cleared from a trauma standpoint other than thoracic compression fracture (see below) -d/c to SNF   Acute metabolic encephalopathy -Patient presenting with encephalopathy as evidenced by his confusion -Pt is suspected to have underlying mild dementia PTA, although not yet diagnosed -may be contributed by UTI Plan: --treat UTI as above   Possible Dementia -Not previously formally diagnosed, but has reported issues with sundowning while hospitalized in January  -Suspect underlying cognitive impairment which is being increasingly unmasked -Seen by PT/OT. Plan for SNF or memory care unit   Afib --rate controlled --cont Cardizem --cont Pradaxa   Depression -Recently on wellbutrin and lexapro, held on admission --cont  to hold both   HLD -Continue Lipitor   Glaucoma -Continue Alphagan, Trusopt   T-spine compression fracture -Neurosurgery consulted -Per Neurosurgery, no acute neurosurgical intervention indicated at this time, no brace needed, activity as tolerated without restrictions     DVT prophylaxis: dabigatran Code Status: DNR Family Communication:  Status is: Inpatient   Inpatient level of care appropriate due to severity of illness  Dispo: The patient is from: ALF              Anticipated d/c is to: SNF              Patient currently is not medically stable to d/c.  On meropenem for ESBL UTI    Difficult to place patient No  Consultants:  Neurosurgery  Procedures:    Antimicrobials: Anti-infectives (From admission, onward)    Start     Dose/Rate Route Frequency Ordered Stop   01/20/21 1130  meropenem (MERREM) 1 g in sodium chloride 0.9 % 100 mL IVPB        1 g 200 mL/hr over 30 Minutes Intravenous Every 12 hours 01/20/21 1036     01/19/21 1315  cefTRIAXone (ROCEPHIN) 1 g in sodium chloride 0.9 % 100 mL IVPB  Status:  Discontinued        1 g 200 mL/hr over 30 Minutes Intravenous Every 24 hours 01/19/21 1223 01/20/21 1032       Subjective: Sleeping, not responding to questions.   Objective: Vitals:   01/19/21 1935 01/20/21 0512 01/20/21 0749 01/20/21 1416  BP: 115/67 (!) 140/58  104/64  Pulse: 82 72  75  Resp: 20 18  16   Temp: 100.2 F (37.9 C) 97.9 F (36.6 C)  98 F (36.7 C)  TempSrc: Axillary Axillary  Oral  SpO2: 97% 97% 96% 95%  Weight:      Height:        Intake/Output Summary (Last 24 hours) at 01/20/2021 1726 Last data filed at 01/20/2021 1538 Gross per 24 hour  Intake 100 ml  Output 1000 ml  Net -900 ml   Filed Weights   01/17/21 0722  Weight: 81.6 kg    Examination: Constitutional: NAD CV: No cyanosis.   RESP: normal respiratory effort, on RA Extremities: No effusions, edema in BLE SKIN: warm, dry, scabs over shins   Data Reviewed: I  have personally reviewed following labs and imaging studies  CBC: Recent Labs  Lab 01/17/21 0725 01/17/21 0733 01/18/21 0341 01/19/21 0156 01/20/21 0137  WBC 8.2  --  9.6 8.1 14.1*  HGB 12.2* 12.2* 12.1* 11.5* 10.5*  HCT 37.2* 36.0* 35.2* 33.4* 31.6*  MCV 98.9  --  94.1 95.2 97.8  PLT 276  --  259 227 191   Basic Metabolic Panel: Recent Labs  Lab 01/17/21 0725 01/17/21 0733 01/18/21 0341 01/19/21 0156 01/20/21 0137  NA 137 138 140 141 140  K 3.9 3.7 2.7* 3.2* 4.0  CL 105 106 103 108 107  CO2 20*  --  22 27 27   GLUCOSE 104* 103* 71 101* 106*  BUN 48* 53* 28* 22 21  CREATININE 1.44* 1.40* 1.21 1.12 1.08  CALCIUM 9.6  --  9.8 9.4 9.0   GFR: Estimated Creatinine Clearance: 46.4 mL/min (by C-G formula based on SCr of 1.08 mg/dL). Liver Function Tests: Recent Labs  Lab 01/17/21 0725 01/19/21 0156 01/20/21 0137  AST 40 32 25  ALT 24 18 19   ALKPHOS 67 64 49  BILITOT 1.0 0.9 0.9  PROT 6.1* 5.5* 5.1*  ALBUMIN 3.9 3.1* 2.8*   No results for input(s): LIPASE, AMYLASE in the last 168 hours. No results for input(s): AMMONIA in the last 168 hours. Coagulation Profile: Recent Labs  Lab 01/17/21 0725  INR 1.6*   Cardiac Enzymes: Recent Labs  Lab 01/17/21 0725  CKTOTAL 107   BNP (last 3 results) No results for input(s): PROBNP in the last 8760 hours. HbA1C: No results for input(s): HGBA1C in the last 72 hours. CBG: No results for input(s): GLUCAP in the last 168 hours. Lipid Profile: No results for input(s): CHOL, HDL, LDLCALC, TRIG, CHOLHDL, LDLDIRECT in the last 72 hours. Thyroid Function Tests: No results for input(s): TSH, T4TOTAL, FREET4, T3FREE, THYROIDAB in the last 72 hours. Anemia Panel: No results for input(s): VITAMINB12, FOLATE, FERRITIN, TIBC, IRON, RETICCTPCT in the last 72 hours. Sepsis Labs: Recent Labs  Lab 01/17/21 0725  LATICACIDVEN 3.8*    Recent Results (from the past 240 hour(s))  Resp Panel by RT-PCR (Flu A&B, Covid)  Nasopharyngeal Swab     Status: None   Collection Time: 01/17/21  7:25 AM   Specimen: Nasopharyngeal Swab; Nasopharyngeal(NP) swabs in vial transport medium  Result Value Ref Range Status   SARS Coronavirus 2 by RT PCR NEGATIVE NEGATIVE Final    Comment: (NOTE) SARS-CoV-2 target nucleic acids are NOT DETECTED.  The SARS-CoV-2 RNA is generally detectable in upper respiratory specimens during the acute phase of infection. The lowest concentration of SARS-CoV-2 viral copies this assay can detect is 138 copies/mL. A negative result does not preclude SARS-Cov-2 infection and should not be used as the sole basis for treatment  or other patient management decisions. A negative result may occur with  improper specimen collection/handling, submission of specimen other than nasopharyngeal swab, presence of viral mutation(s) within the areas targeted by this assay, and inadequate number of viral copies(<138 copies/mL). A negative result must be combined with clinical observations, patient history, and epidemiological information. The expected result is Negative.  Fact Sheet for Patients:  BloggerCourse.com  Fact Sheet for Healthcare Providers:  SeriousBroker.it  This test is no t yet approved or cleared by the [place] FDA and  has been authorized for detection and/or diagnosis of SARS-CoV-2 by FDA under an Emergency Use Authorization (EUA). This EUA will remain  in effect (meaning this test can be used) for the duration of the COVID-19 declaration under Section 564(b)(1) of the Act, 21 U.S.C.section 360bbb-3(b)(1), unless the authorization is terminated  or revoked sooner.       Influenza A by PCR NEGATIVE NEGATIVE Final   Influenza B by PCR NEGATIVE NEGATIVE Final    Comment: (NOTE) The Xpert Xpress SARS-CoV-2/FLU/RSV plus assay is intended as an aid in the diagnosis of influenza from Nasopharyngeal swab specimens and should not be  used as a sole basis for treatment. Nasal washings and aspirates are unacceptable for Xpert Xpress SARS-CoV-2/FLU/RSV testing.  Fact Sheet for Patients: BloggerCourse.com  Fact Sheet for Healthcare Providers: SeriousBroker.it  This test is not yet approved or cleared by the [place] FDA and has been authorized for detection and/or diagnosis of SARS-CoV-2 by FDA under an Emergency Use Authorization (EUA). This EUA will remain in effect (meaning this test can be used) for the duration of the COVID-19 declaration under Section 564(b)(1) of the Act, 21 U.S.C. section 360bbb-3(b)(1), unless the authorization is terminated or revoked.  Performed at [facility], [address] [place], [place] [address]   Culture, Urine     Status: Abnormal   Collection Time: 01/17/21  3:15 PM   Specimen: Urine, Random  Result Value Ref Range Status   Specimen Description URINE, RANDOM  Final   Special Requests   Final    NONE Performed at [facility], [address] [place], [place] [address]    Culture (A)  Final    >=100,000 COLONIES/mL KLEBSIELLA PNEUMONIAE Confirmed Extended Spectrum Beta-Lactamase Producer (ESBL).  In bloodstream infections from ESBL organisms, carbapenems are preferred over piperacillin/tazobactam. They are shown to have a lower risk of mortality.    Report Status 01/20/2021 FINAL  Final   Organism ID, Bacteria KLEBSIELLA PNEUMONIAE (A)  Final      Susceptibility   Klebsiella pneumoniae - MIC*    AMPICILLIN >=32 RESISTANT Resistant     CEFAZOLIN >=64 RESISTANT Resistant     CEFEPIME >=32 RESISTANT Resistant     CEFTRIAXONE >=64 RESISTANT Resistant     CIPROFLOXACIN >=4 RESISTANT Resistant     GENTAMICIN <=1 SENSITIVE Sensitive     IMIPENEM 0.5 SENSITIVE Sensitive     NITROFURANTOIN 64 INTERMEDIATE Intermediate     TRIMETH/SULFA >=320 RESISTANT Resistant     AMPICILLIN/SULBACTAM >=32 RESISTANT  Resistant     PIP/TAZO >=128 RESISTANT Resistant     * >=100,000 COLONIES/mL KLEBSIELLA PNEUMONIAE     Radiology Studies: No results found.  Scheduled Meds:  atorvastatin  10 mg Oral Daily   brimonidine  1 drop Both Eyes TID   dabigatran  150 mg Oral Q12H   diltiazem  30 mg Oral BID   docusate sodium  100 mg Oral BID   dorzolamide  1  drop Both Eyes BID   finasteride  5 mg Oral Daily   pantoprazole  40 mg Oral Daily   Continuous Infusions:  lactated ringers 75 mL/hr at 01/20/21 0944   meropenem (MERREM) IV 1 g (01/20/21 1333)     LOS: 2 days   Darlin Priestly, MD [facility] Pager On Amion  If 7PM-7AM, please contact night-coverage 01/20/2021, 5:26 PM

## 2021-01-20 NOTE — TOC Progression Note (Signed)
Transition of Care [facility]) - Progression Note    Patient Details  Name: Mark Moore MRN: [id number] Date of Birth: September 26, 1928  [facility] CM/SW Contact  Beckie Busing, RN Phone Number:[phone]  01/20/2021, 9:44 AM  Clinical Narrative:    CM spoke with Toni Amend at [facility] which is where the patient is from/ Per Toni Amend her boss spoke with the patients daughter this morning and the facility may be able to accept the patient into the memory care unit at [facility]. FL2 has been faxed. CM will follow up for possible memory care placement.    Expected Discharge Plan: Skilled Nursing Facility Barriers to Discharge: Continued Medical Work up  Expected Discharge Plan and Services Expected Discharge Plan: Skilled Nursing Facility In-house Referral: Clinical Social Work     Living arrangements for the past 2 months: Assisted Living Facility                                       Social Determinants of Health (SDOH) Interventions    Readmission Risk Interventions No flowsheet data found.

## 2021-01-20 NOTE — Progress Notes (Signed)
Physical Therapy Treatment Patient Details Name: Mark Moore MRN: [id number] DOB: 1929/01/25 Today's Date: 01/20/2021    History of Present Illness 87 y.o. male presented 01/17/21 with a fall and confusion. T12 compression fx (neurosurgery rec no intervention, no brace needed), ?UTI vs side effects of new med;  PMH significant of afib and HLD, wife died in 04/15/2023 and hospitalized in January with FTT (in [place] family moved him to ALF in [place]; dementia    PT Comments    Patient slightly more confused this date than on PT evaluation, however moving better and with less physical assist (overall min assist). Noted CIR denial and discharge plan updated.    Follow Up Recommendations  SNF;Supervision/Assistance - 24 hour     Equipment Recommendations  None recommended by PT    Recommendations for Other Services       Precautions / Restrictions Precautions Precautions: Fall Precaution Comments: back precautions for comfort Restrictions Weight Bearing Restrictions: No    Mobility  Bed Mobility Overal bed mobility: Needs Assistance Bed Mobility: Rolling;Sidelying to Sit Rolling: Min assist;Min guard Sidelying to sit: Min assist       General bed mobility comments: pt able to roll R<>L for pericare with initial minA progressing to min guard with MAX cues, pt transitioned into sitting via log roll tehcnique needed MOD cues for sequence technique but only MINA to elevate trunk into sitting    Transfers Overall transfer level: Needs assistance Equipment used: Rolling walker (2 wheeled) Transfers: Sit to/from Stand Sit to Stand: Min assist         General transfer comment: vc for hand placement and min boost to initiate standing  Ambulation/Gait Ambulation/Gait assistance: Min assist Gait Distance (Feet): 20 Feet (seated rest, 20 ft) Assistive device: Rolling walker (2 wheeled) Gait Pattern/deviations: Step-to pattern;Step-through pattern;Decreased step  length - left;Narrow base of support Gait velocity: decr   General Gait Details: no lean to his right but continues wiht narrow BOS; assist to maneuver RW   Stairs             Wheelchair Mobility    Modified Rankin (Stroke Patients Only)       Balance Overall balance assessment: Needs assistance Sitting-balance support: No upper extremity supported;Feet supported Sitting balance-Leahy Scale: Fair Sitting balance - Comments: supervision for safety   Standing balance support: Bilateral upper extremity supported Standing balance-Leahy Scale: Poor Standing balance comment: bil UE support via RW                            Cognition Arousal/Alertness: Awake/alert Behavior During Therapy: Flat affect (very flat with delayed responses to daughter when speaking Spanish; no response to virtual interpreter) Overall Cognitive Status: Impaired/Different from baseline                                 General Comments: daughter initially present and reports pt is more confused than when PT last worked with pt; incr time to process and follow instructions (when given by daughter) no response to virtual interpreter      Exercises      General Comments General comments (skin integrity, edema, etc.): pts daughter present initially to interpret during session (she is a Orthoptist); when she left, switched to virtual interpreter and pt did not seem to understand she was talking to him (volume was sufficient, he just appeared bewildered)  Pertinent Vitals/Pain Pain Assessment: Faces Faces Pain Scale: No hurt Pain Location: buttock during pericare Pain Descriptors / Indicators: Sore Pain Intervention(s): Monitored during session;Limited activity within patient's tolerance    Home Living                      Prior Function            PT Goals (current goals can now be found in the care plan section) Acute Rehab PT Goals Patient  Stated Goal: agrees wants to get OOB Time For Goal Achievement: 02/01/21 Potential to Achieve Goals: Good Progress towards PT goals: Progressing toward goals    Frequency    Min 2X/week      PT Plan Discharge plan needs to be updated;Frequency needs to be updated    Co-evaluation              AM-PAC PT "6 Clicks" Mobility   Outcome Measure  Help needed turning from your back to your side while in a flat bed without using bedrails?: A Lot Help needed moving from lying on your back to sitting on the side of a flat bed without using bedrails?: Total Help needed moving to and from a bed to a chair (including a wheelchair)?: A Little Help needed standing up from a chair using your arms (e.g., wheelchair or bedside chair)?: A Little Help needed to walk in hospital room?: A Little Help needed climbing 3-5 steps with a railing? : A Lot 6 Click Score: 14    End of Session Equipment Utilized During Treatment: Gait belt Activity Tolerance: Patient tolerated treatment well Patient left: in chair;with call bell/phone within reach;with chair alarm set Nurse Communication: Mobility status;Other (comment) (condom catheter fell off while walking) PT Visit Diagnosis: Unsteadiness on feet (R26.81);History of falling (Z91.81)     Time: 0258-5277 PT Time Calculation (min) (ACUTE ONLY): 25 min  Charges:  $Gait Training: 23-37 mins                      Jerolyn Center, PT Pager [phone]    Zena Amos 01/20/2021, 9:51 AM

## 2021-01-20 NOTE — Plan of Care (Signed)
  Problem: Health Behavior/Discharge Planning: Goal: Ability to manage health-related needs will improve Outcome: Progressing   Problem: Activity: Goal: Risk for activity intolerance will decrease Outcome: Progressing   Problem: Coping: Goal: Level of anxiety will decrease Outcome: Progressing   

## 2021-01-21 LAB — BASIC METABOLIC PANEL
Anion gap: 8 (ref 5–15)
BUN: 21 mg/dL (ref 8–23)
CO2: 25 mmol/L (ref 22–32)
Calcium: 9 mg/dL (ref 8.9–10.3)
Chloride: 107 mmol/L (ref 98–111)
Creatinine, Ser: 0.87 mg/dL (ref 0.61–1.24)
GFR, Estimated: >60 mL/min (ref >60)
Glucose, Bld: 93 mg/dL (ref 70–99)
Potassium: 3.5 mmol/L (ref 3.5–5.1)
Sodium: 140 mmol/L (ref 135–145)

## 2021-01-21 LAB — CBC
HCT: 33.2 % — ABNORMAL LOW (ref 39.0–52.0)
Hemoglobin: 11.3 g/dL — ABNORMAL LOW (ref 13.0–17.0)
MCH: 32.4 pg (ref 26.0–34.0)
MCHC: 34 g/dL (ref 30.0–36.0)
MCV: 95.1 fL (ref 80.0–100.0)
Platelets: 190 10*3/uL (ref 150–400)
RBC: 3.49 MIL/uL — ABNORMAL LOW (ref 4.22–5.81)
RDW: 15.7 % — ABNORMAL HIGH (ref 11.5–15.5)
WBC: 8.7 10*3/uL (ref 4.0–10.5)
nRBC: 0 % (ref 0.0–0.2)

## 2021-01-21 LAB — MAGNESIUM: Magnesium: 1.2 mg/dL — ABNORMAL LOW (ref 1.7–2.4)

## 2021-01-21 MED ORDER — FOSFOMYCIN TROMETHAMINE 3 G PO PACK
3.0000 g | PACK | ORAL | Status: AC
  Administered 2021-01-21 – 2021-01-24 (×2): 3 g via ORAL
  Filled 2021-01-21 (×2): qty 3

## 2021-01-21 MED ORDER — SODIUM CHLORIDE 0.9 % IV SOLN
1.0000 g | Freq: Three times a day (TID) | INTRAVENOUS | Status: DC
  Filled 2021-01-21: qty 1

## 2021-01-21 MED ORDER — MAGNESIUM SULFATE 4 GM/100ML IV SOLN
4.0000 g | Freq: Once | INTRAVENOUS | Status: AC
  Administered 2021-01-21: 4 g via INTRAVENOUS
  Filled 2021-01-21: qty 100

## 2021-01-21 NOTE — Plan of Care (Signed)
  Problem: Education: Goal: Knowledge of General Education information will improve Description Including pain rating scale, medication(s)/side effects and non-pharmacologic comfort measures Outcome: Progressing   Problem: Health Behavior/Discharge Planning: Goal: Ability to manage health-related needs will improve Outcome: Progressing   

## 2021-01-21 NOTE — TOC Progression Note (Addendum)
Transition of Care [facility]) - Progression Note    Patient Details  Name: Mark Moore MRN: [id number] Date of Birth: 1928/09/21  Transition of Care [facility]) CM/SW Contact  Beckie Busing, RN Phone Number:[phone]  01/21/2021, 9:49 AM  Clinical Narrative:    CM spoke with Mark Moore the intake coordinator at [facility] to verify that [facility] has been received and to determine if patient is able to return with PICC and IV antibiotics. Intake coordinator is unsure about IV abx and will check and get back with CM. There are currently no other bed offers.   1320 MD sent CM message to inquire if patient is able to return to [facility] memory care over the weekend if the patient is changed from IV to oral antibiotics. CM spoke with Mark Moore who states that the patient can definitely come to the memory care. CM will need to speak with Mark Moore the healthcare director in reference to when the patient can be accepted back to the facility. CM will follow up with [facility].   1340 CM spoke with Mark Moore the Investment banker, operational at [facility]. The facility will not be able to accept the patient over the weekend. Message has been sent to MD to make her aware. Daughter has been made aware.   1510 FL2 and PT not has been faxed to [facility].  1520 CM received call from  NP at [facility] stating that according to UA culture prior to patient being admitted to hospital indicates that patient should be on IV/ IM antibiotics for at least 10 days. The facility has questions about the regimen for the antibiotics and if the patient will be appropriate to return to the memory care unit. Message has been sent to MD to call Lunette Stands NP [phone] to discuss IV abx. TOC will continue to follow.    Expected Discharge Plan: Skilled Nursing Facility Barriers to Discharge: Continued Medical Work up  Expected Discharge Plan and Services Expected Discharge Plan: Skilled Nursing Facility In-house Referral: Clinical Social Work     Living  arrangements for the past 2 months: Assisted Living Facility                                       Social Determinants of Health (SDOH) Interventions    Readmission Risk Interventions No flowsheet data found.

## 2021-01-21 NOTE — Progress Notes (Signed)
ANTICOAGULATION CONSULT NOTE - Follow Up Consult  Pharmacy Consult for Pradaxa Indication: Afib  Allergies  Allergen Reactions   Wellbutrin [Bupropion] Other (See Comments)    XL 150 mg- Made the patient feel paranoid and have borderline hallucinations    Patient Measurements: Height: 5\' 9"  (175.3 cm) Weight: 81.6 kg (180 lb) IBW/kg (Calculated) : 70.7  Vital Signs: Temp: 98.1 F (36.7 C) (06/23 0506) Temp Source: Axillary (06/23 0506) BP: 124/90 (06/23 0900) Pulse Rate: 80 (06/23 0900)  Labs: Recent Labs    01/19/21 0156 01/20/21 0137 01/21/21 0203  HGB 11.5* 10.5* 11.3*  HCT 33.4* 31.6* 33.2*  PLT 227 191 190  CREATININE 1.12 1.08 0.87    Estimated Creatinine Clearance: 57.6 mL/min (by C-G formula based on SCr of 0.87 mg/dL).   Assessment:  Anticoag: Pradaxa continued from PTA for afib. Hgb 11.3 stable. Plts 190 down slightly.Scr now <1.  Goal of Therapy:  Therapeutic oral anticoagulation    Plan:  Pradaxa 150mg  BID Adjusted Merrem to every 8hr for improved renal function Pharmacy will sign off. Please reconsult for further dosing assitance.    Sharlet Notaro S. 01/23/21, PharmD, BCPS Clinical Staff Pharmacist [email or web address] , Merilynn Finland 01/21/2021,9:37 AM

## 2021-01-21 NOTE — Progress Notes (Signed)
PROGRESS NOTE    Mark Moore  JAS:[id number] DOB: 1928-11-24 DOA: 01/17/2021 PCP: Wanda Plump, MD    Brief Narrative:  87 y.o. male with medical history significant of afib and HLD presenting with a fall.  His wife died in 04-28-2023 and he was hospitalized in January in [place] for FTT.  His family moved him to the [place] in April and he lived with his daughter for a few weeks prior to transitioning to [facility]. Pt was recently treated for UTI with bactrim and also started on Wellbutrin for depression. Pt noted to be increasingly confused over the last week but it acutely worsened 2 days prior to admit.   Assessment & Plan:   Principal Problem:   Fall (on)(from) sidewalk curb, initial encounter Active Problems:   Acute metabolic encephalopathy   Atrial fibrillation, chronic (HCC)   Dementia (HCC)   Depression   Dyslipidemia   Glaucoma   Thoracic compression fracture, closed, initial encounter (HCC)   DNR (do not resuscitate)   Acute encephalopathy  ESBL Klebsiella UTI, POA --started on ceftriaxone on presentation, then switched to meropenem --switch to fosfomycin today, for 2 doses lasting 6 days of treatment  Fall -Patient noted to have wandered away from his ALF and was found down outside the facility -Cleared from a trauma standpoint other than thoracic compression fracture (see below) --PT rec SNF   Acute metabolic encephalopathy -Patient presenting with encephalopathy as evidenced by his confusion -Pt is suspected to have underlying mild dementia PTA, although not yet diagnosed -may be contributed by UTI Plan: --treat UTI as above   Possible Dementia -Not previously formally diagnosed, but has reported issues with sundowning while hospitalized in January  -Suspect underlying cognitive impairment which is being increasingly unmasked -Seen by PT/OT. Plan for SNF or memory care unit   Afib --rate controlled Plan: --cont cardizem --cont Pradaxa    Depression -Recently on wellbutrin and lexapro, held on admission --cont to hold both   HLD --cont lipitor   Glaucoma -Continue Alphagan, Trusopt   T-spine compression fracture -Neurosurgery consulted -Per Neurosurgery, no acute neurosurgical intervention indicated at this time, no brace needed, activity as tolerated without restrictions     DVT prophylaxis: dabigatran Code Status: DNR Family Communication: daughter updated on the phone today Status is: Inpatient   Inpatient level of care appropriate due to severity of illness  Dispo: The patient is from: ALF              Anticipated d/c is to: SNF              Patient currently is medically stable to d/c.  Waiting on placement.    Difficult to place patient No  Consultants:  Neurosurgery  Procedures:    Antimicrobials: Anti-infectives (From admission, onward)    Start     Dose/Rate Route Frequency Ordered Stop   01/21/21 1700  meropenem (MERREM) 1 g in sodium chloride 0.9 % 100 mL IVPB  Status:  Discontinued        1 g 200 mL/hr over 30 Minutes Intravenous Every 8 hours 01/21/21 0937 01/21/21 1212   01/21/21 1300  fosfomycin (MONUROL) packet 3 g        3 g Oral every 72 hours 01/21/21 1212 01/27/21 1259   01/20/21 1130  meropenem (MERREM) 1 g in sodium chloride 0.9 % 100 mL IVPB  Status:  Discontinued        1 g 200 mL/hr over 30 Minutes Intravenous Every  12 hours 01/20/21 1036 01/21/21 0937   01/19/21 1315  cefTRIAXone (ROCEPHIN) 1 g in sodium chloride 0.9 % 100 mL IVPB  Status:  Discontinued        1 g 200 mL/hr over 30 Minutes Intravenous Every 24 hours 01/19/21 1223 01/20/21 1032       Subjective: Pt was alert and talkative during rounds today.  Nursing helped with Spanish interpretation due to pt's hearing issue with iPad, and all pt wanted was to talk to his daughter.  No complaint of pain or dysuria. Nursing reported pt did eat his meals.   Objective: Vitals:   01/21/21 0506 01/21/21 0711 01/21/21  0900 01/21/21 1626  BP: (!) 143/89  124/90 (!) 143/89  Pulse: 84  80 (!) 102  Resp: 20  18 15   Temp: 98.1 F (36.7 C)   97.6 F (36.4 C)  TempSrc: Axillary     SpO2: 99% 96% 99% 99%  Weight:      Height:        Intake/Output Summary (Last 24 hours) at 01/21/2021 1802 Last data filed at 01/21/2021 1513 Gross per 24 hour  Intake 100 ml  Output 250 ml  Net -150 ml   Filed Weights   01/17/21 0722  Weight: 81.6 kg    Examination: Constitutional: NAD, alert, oriented to self HEENT: conjunctivae and lids normal, EOMI CV: No cyanosis.   RESP: normal respiratory effort, on RA GI: abdomen soft, no tenderness to palpation Extremities: No effusions, edema in BLE SKIN: warm, dry Neuro: II - XII grossly intact.      Data Reviewed: I have personally reviewed following labs and imaging studies  CBC: Recent Labs  Lab 01/17/21 0725 01/17/21 0733 01/18/21 0341 01/19/21 0156 01/20/21 0137 01/21/21 0203  WBC 8.2  --  9.6 8.1 14.1* 8.7  HGB 12.2* 12.2* 12.1* 11.5* 10.5* 11.3*  HCT 37.2* 36.0* 35.2* 33.4* 31.6* 33.2*  MCV 98.9  --  94.1 95.2 97.8 95.1  PLT 276  --  259 227 191 190   Basic Metabolic Panel: Recent Labs  Lab 01/17/21 0725 01/17/21 0733 01/18/21 0341 01/19/21 0156 01/20/21 0137 01/21/21 0203  NA 137 138 140 141 140 140  K 3.9 3.7 2.7* 3.2* 4.0 3.5  CL 105 106 103 108 107 107  CO2 20*  --  22 27 27 25   GLUCOSE 104* 103* 71 101* 106* 93  BUN 48* 53* 28* 22 21 21   CREATININE 1.44* 1.40* 1.21 1.12 1.08 0.87  CALCIUM 9.6  --  9.8 9.4 9.0 9.0  MG  --   --   --   --   --  1.2*   GFR: Estimated Creatinine Clearance: 57.6 mL/min (by C-G formula based on SCr of 0.87 mg/dL). Liver Function Tests: Recent Labs  Lab 01/17/21 0725 01/19/21 0156 01/20/21 0137  AST 40 32 25  ALT 24 18 19   ALKPHOS 67 64 49  BILITOT 1.0 0.9 0.9  PROT 6.1* 5.5* 5.1*  ALBUMIN 3.9 3.1* 2.8*   No results for input(s): LIPASE, AMYLASE in the last 168 hours. No results for  input(s): AMMONIA in the last 168 hours. Coagulation Profile: Recent Labs  Lab 01/17/21 0725  INR 1.6*   Cardiac Enzymes: Recent Labs  Lab 01/17/21 0725  CKTOTAL 107   BNP (last 3 results) No results for input(s): PROBNP in the last 8760 hours. HbA1C: No results for input(s): HGBA1C in the last 72 hours. CBG: No results for input(s): GLUCAP in the last 168 hours.  Lipid Profile: No results for input(s): CHOL, HDL, LDLCALC, TRIG, CHOLHDL, LDLDIRECT in the last 72 hours. Thyroid Function Tests: No results for input(s): TSH, T4TOTAL, FREET4, T3FREE, THYROIDAB in the last 72 hours. Anemia Panel: No results for input(s): VITAMINB12, FOLATE, FERRITIN, TIBC, IRON, RETICCTPCT in the last 72 hours. Sepsis Labs: Recent Labs  Lab 01/17/21 0725  LATICACIDVEN 3.8*    Recent Results (from the past 240 hour(s))  Resp Panel by RT-PCR (Flu A&B, Covid) Nasopharyngeal Swab     Status: None   Collection Time: 01/17/21  7:25 AM   Specimen: Nasopharyngeal Swab; Nasopharyngeal(NP) swabs in vial transport medium  Result Value Ref Range Status   SARS Coronavirus 2 by RT PCR NEGATIVE NEGATIVE Final    Comment: (NOTE) SARS-CoV-2 target nucleic acids are NOT DETECTED.  The SARS-CoV-2 RNA is generally detectable in upper respiratory specimens during the acute phase of infection. The lowest concentration of SARS-CoV-2 viral copies this assay can detect is 138 copies/mL. A negative result does not preclude SARS-Cov-2 infection and should not be used as the sole basis for treatment or other patient management decisions. A negative result may occur with  improper specimen collection/handling, submission of specimen other than nasopharyngeal swab, presence of viral mutation(s) within the areas targeted by this assay, and inadequate number of viral copies(<138 copies/mL). A negative result must be combined with clinical observations, patient history, and epidemiological information. The expected  result is Negative.  Fact Sheet for Patients:  BloggerCourse.com  Fact Sheet for Healthcare Providers:  SeriousBroker.it  This test is no t yet approved or cleared by the [place] FDA and  has been authorized for detection and/or diagnosis of SARS-CoV-2 by FDA under an Emergency Use Authorization (EUA). This EUA will remain  in effect (meaning this test can be used) for the duration of the COVID-19 declaration under Section 564(b)(1) of the Act, 21 U.S.C.section 360bbb-3(b)(1), unless the authorization is terminated  or revoked sooner.       Influenza A by PCR NEGATIVE NEGATIVE Final   Influenza B by PCR NEGATIVE NEGATIVE Final    Comment: (NOTE) The Xpert Xpress SARS-CoV-2/FLU/RSV plus assay is intended as an aid in the diagnosis of influenza from Nasopharyngeal swab specimens and should not be used as a sole basis for treatment. Nasal washings and aspirates are unacceptable for Xpert Xpress SARS-CoV-2/FLU/RSV testing.  Fact Sheet for Patients: BloggerCourse.com  Fact Sheet for Healthcare Providers: SeriousBroker.it  This test is not yet approved or cleared by the [place] FDA and has been authorized for detection and/or diagnosis of SARS-CoV-2 by FDA under an Emergency Use Authorization (EUA). This EUA will remain in effect (meaning this test can be used) for the duration of the COVID-19 declaration under Section 564(b)(1) of the Act, 21 U.S.C. section 360bbb-3(b)(1), unless the authorization is terminated or revoked.  Performed at [facility], [address] [place], [place] [address]   Culture, Urine     Status: Abnormal   Collection Time: 01/17/21  3:15 PM   Specimen: Urine, Random  Result Value Ref Range Status   Specimen Description URINE, RANDOM  Final   Special Requests   Final    NONE Performed at [facility], [address],  [place], [place] [address]    Culture (A)  Final    >=100,000 COLONIES/mL KLEBSIELLA PNEUMONIAE Confirmed Extended Spectrum Beta-Lactamase Producer (ESBL).  In bloodstream infections from ESBL organisms, carbapenems are preferred over piperacillin/tazobactam. They are shown to have a lower risk of  mortality.    Report Status 01/20/2021 FINAL  Final   Organism ID, Bacteria KLEBSIELLA PNEUMONIAE (A)  Final      Susceptibility   Klebsiella pneumoniae - MIC*    AMPICILLIN >=32 RESISTANT Resistant     CEFAZOLIN >=64 RESISTANT Resistant     CEFEPIME >=32 RESISTANT Resistant     CEFTRIAXONE >=64 RESISTANT Resistant     CIPROFLOXACIN >=4 RESISTANT Resistant     GENTAMICIN <=1 SENSITIVE Sensitive     IMIPENEM 0.5 SENSITIVE Sensitive     NITROFURANTOIN 64 INTERMEDIATE Intermediate     TRIMETH/SULFA >=320 RESISTANT Resistant     AMPICILLIN/SULBACTAM >=32 RESISTANT Resistant     PIP/TAZO >=128 RESISTANT Resistant     * >=100,000 COLONIES/mL KLEBSIELLA PNEUMONIAE     Radiology Studies: No results found.  Scheduled Meds:  atorvastatin  10 mg Oral Daily   brimonidine  1 drop Both Eyes TID   dabigatran  150 mg Oral Q12H   diltiazem  30 mg Oral BID   docusate sodium  100 mg Oral BID   dorzolamide  1 drop Both Eyes BID   finasteride  5 mg Oral Daily   fosfomycin  3 g Oral Q72H   pantoprazole  40 mg Oral Daily   Continuous Infusions:  lactated ringers 75 mL/hr at 01/21/21 1513     LOS: 3 days   Darlin Priestly, MD [facility] Pager On Amion  If 7PM-7AM, please contact night-coverage 01/21/2021, 6:02 PM

## 2021-01-21 NOTE — NC FL2 (Addendum)
[place] MEDICAID FL2 LEVEL OF CARE SCREENING TOOL     IDENTIFICATION  Patient Name: Mark Moore Birthdate: January 27, 1929 Sex: male Admission Date (Current Location): 01/17/2021  [facility] and [place] Number:  Producer, television/film/video and Address:  [facility] [place], [address], [place], [place] [address]      Provider Number: [id number]  Attending Physician Name and Address:  Darlin Priestly, MD  Relative Name and Phone Number:  Myrle Sheng [phone]    Current Level of Care: Hospital Recommended Level of Care: Secured assisted living Prior Approval Number:    Date Approved/Denied:   PASRR Number:    Discharge Plan: Other: Secured assisted living    Current Diagnoses: Patient Active Problem List   Diagnosis Date Noted   Acute encephalopathy 01/18/2021   Acute metabolic encephalopathy 01/17/2021   Atrial fibrillation, chronic (HCC) 01/17/2021   Fall (on)(from) sidewalk curb, initial encounter 01/17/2021   Dementia (HCC) 01/17/2021   Depression 01/17/2021   Dyslipidemia 01/17/2021   Glaucoma 01/17/2021   Thoracic compression fracture, closed, initial encounter (HCC) 01/17/2021   DNR (do not resuscitate) 01/17/2021    Orientation RESPIRATION BLADDER Height & Weight     Self  Normal External catheter Weight: 81.6 kg Height:  5\' 9"  (175.3 cm)  BEHAVIORAL SYMPTOMS/MOOD NEUROLOGICAL BOWEL NUTRITION STATUS     (n/a) Continent Diet  AMBULATORY STATUS COMMUNICATION OF NEEDS Skin   Limited Assist (rolling walker) Does not communicate (requires interpreter but currently not responding appropiately to questions) Skin abrasions, Bruising, Other (Comment)                       Personal Care Assistance Level of Assistance  Bathing, Feeding, Dressing, Total care Bathing Assistance: Limited assistance Feeding assistance: Limited assistance Dressing Assistance: Limited assistance Total Care Assistance: Limited assistance   Functional Limitations  Info  Sight, Hearing, Speech Sight Info: Impaired Hearing Info: Adequate Speech Info: Adequate    SPECIAL CARE FACTORS FREQUENCY  PT (By licensed PT), OT (By licensed OT)     PT Frequency :3X OT Frequency : 3X            Contractures Contractures Info: Not present    Additional Factors Info  Code Status, Allergies, Psychotropic, Insulin Sliding Scale, Isolation Precautions, Suctioning Needs Code Status Info: DNR Allergies Info: Wellbutrin Psychotropic Info: see d/c summary Insulin Sliding Scale Info: see d/c summary for sliding scale information Isolation Precautions Info: None Suctioning Needs: n/a   Current Medications (01/21/2021):  This is the current hospital active medication list Current Facility-Administered Medications  Medication Dose Route Frequency Provider Last Rate Last Admin   acetaminophen (TYLENOL) tablet 650 mg  650 mg Oral Q6H PRN 01/23/2021, MD   650 mg at 01/20/21 2146   Or   acetaminophen (TYLENOL) suppository 650 mg  650 mg Rectal Q6H PRN 2147, MD       atorvastatin (LIPITOR) tablet 10 mg  10 mg Oral Daily Jonah Blue, MD   10 mg at 01/21/21 0911   bisacodyl (DULCOLAX) EC tablet 5 mg  5 mg Oral Daily PRN 01/23/21, MD       brimonidine (ALPHAGAN) 0.2 % ophthalmic solution 1 drop  1 drop Both Eyes TID Jonah Blue, MD   1 drop at 01/21/21 0910   dabigatran (PRADAXA) capsule 150 mg  150 mg Oral Q12H 01/23/21, RPH   150 mg at 01/21/21 0911   diltiazem (CARDIZEM) tablet 30 mg  30  mg Oral BID Jonah Blue, MD   30 mg at 01/21/21 4268   docusate sodium (COLACE) capsule 100 mg  100 mg Oral BID Jonah Blue, MD   100 mg at 01/20/21 2146   dorzolamide (TRUSOPT) 2 % ophthalmic solution 1 drop  1 drop Both Eyes BID Jonah Blue, MD   1 drop at 01/21/21 0910   finasteride (PROSCAR) tablet 5 mg  5 mg Oral Daily Jonah Blue, MD   5 mg at 01/21/21 0911   fosfomycin (MONUROL) packet 3 g  3 g Oral Q72H Darlin Priestly, MD        haloperidol lactate (HALDOL) injection 2 mg  2 mg Intravenous Q6H PRN Jonah Blue, MD   2 mg at 01/20/21 2147   hydrALAZINE (APRESOLINE) injection 5 mg  5 mg Intravenous Q4H PRN Jonah Blue, MD       lactated ringers infusion   Intravenous Continuous Jonah Blue, MD 75 mL/hr at 01/21/21 0319 New Bag at 01/21/21 0319   magnesium sulfate IVPB 4 g 100 mL  4 g Intravenous Once Darlin Priestly, MD 50 mL/hr at 01/21/21 1308 4 g at 01/21/21 1308   ondansetron (ZOFRAN) tablet 4 mg  4 mg Oral Q6H PRN Jonah Blue, MD       Or   ondansetron [facility]) injection 4 mg  4 mg Intravenous Q6H PRN Jonah Blue, MD       pantoprazole (PROTONIX) EC tablet 40 mg  40 mg Oral Daily Jonah Blue, MD   40 mg at 01/21/21 0911   polyethylene glycol (MIRALAX / GLYCOLAX) packet 17 g  17 g Oral Daily PRN Jonah Blue, MD         Discharge Medications: Please see discharge summary for a list of discharge medications.  Relevant Imaging Results:  Relevant Lab Results:   Additional Information SS # [id number]  Beckie Busing, RN

## 2021-01-22 ENCOUNTER — Ambulatory Visit: Payer: Medicare (Managed Care) | Admitting: Internal Medicine

## 2021-01-22 LAB — CBC
HCT: 32.7 % — ABNORMAL LOW (ref 39.0–52.0)
Hemoglobin: 11.1 g/dL — ABNORMAL LOW (ref 13.0–17.0)
MCH: 32.7 pg (ref 26.0–34.0)
MCHC: 33.9 g/dL (ref 30.0–36.0)
MCV: 96.5 fL (ref 80.0–100.0)
Platelets: 192 10*3/uL (ref 150–400)
RBC: 3.39 MIL/uL — ABNORMAL LOW (ref 4.22–5.81)
RDW: 15.4 % (ref 11.5–15.5)
WBC: 7.3 10*3/uL (ref 4.0–10.5)
nRBC: 0 % (ref 0.0–0.2)

## 2021-01-22 LAB — BASIC METABOLIC PANEL
Anion gap: 7 (ref 5–15)
BUN: 16 mg/dL (ref 8–23)
CO2: 28 mmol/L (ref 22–32)
Calcium: 8.7 mg/dL — ABNORMAL LOW (ref 8.9–10.3)
Chloride: 103 mmol/L (ref 98–111)
Creatinine, Ser: 0.99 mg/dL (ref 0.61–1.24)
GFR, Estimated: >60 mL/min (ref >60)
Glucose, Bld: 99 mg/dL (ref 70–99)
Potassium: 3.1 mmol/L — ABNORMAL LOW (ref 3.5–5.1)
Sodium: 138 mmol/L (ref 135–145)

## 2021-01-22 LAB — MAGNESIUM: Magnesium: 1.9 mg/dL (ref 1.7–2.4)

## 2021-01-22 MED ORDER — QUETIAPINE FUMARATE 50 MG PO TABS
50.0000 mg | ORAL_TABLET | Freq: Every day | ORAL | Status: DC
  Administered 2021-01-22 – 2021-01-23 (×2): 50 mg via ORAL
  Filled 2021-01-22 (×2): qty 1

## 2021-01-22 MED ORDER — POTASSIUM CHLORIDE CRYS ER 20 MEQ PO TBCR
40.0000 meq | EXTENDED_RELEASE_TABLET | Freq: Once | ORAL | Status: AC
  Administered 2021-01-22: 40 meq via ORAL
  Filled 2021-01-22: qty 2

## 2021-01-22 NOTE — Plan of Care (Signed)
  Problem: Clinical Measurements: Goal: Ability to maintain clinical measurements within normal limits will improve Outcome: Progressing  Will continue with current md orders as vs and adl tolerance improve.

## 2021-01-22 NOTE — Plan of Care (Signed)
  Problem: Education: Goal: Knowledge of General Education information will improve Description Including pain rating scale, medication(s)/side effects and non-pharmacologic comfort measures Outcome: Progressing   Problem: Health Behavior/Discharge Planning: Goal: Ability to manage health-related needs will improve Outcome: Progressing   

## 2021-01-22 NOTE — Progress Notes (Signed)
PROGRESS NOTE    Mark Moore  ZOX:[id number] DOB: 03/17/1929 DOA: 01/17/2021 PCP: Wanda PlumpPaz, Jose E, MD    Brief Narrative:  87 y.o. male with medical history significant of afib and HLD presenting with a fall.  His wife died in September and he was hospitalized in January in [place] for FTT.  His [place] the [place] in [place] and he lived with his daughter for a few weeks prior to transitioning to [facility]. Pt was recently treated for UTI with bactrim and also started on Wellbutrin for depression. Pt noted to be increasingly confused over the last week but it acutely worsened 2 days prior to admit.   Assessment & Plan:   Principal Problem:   Fall (on)(from) sidewalk curb, initial encounter Active Problems:   Acute metabolic encephalopathy   Atrial fibrillation, chronic (HCC)   Dementia (HCC)   Depression   Dyslipidemia   Glaucoma   Thoracic compression fracture, closed, initial encounter (HCC)   DNR (do not resuscitate)   Acute encephalopathy  ESBL Klebsiella UTI, POA --started on ceftriaxone on presentation, then switched to meropenem, and then fosfomycin --cont fosfomycin, for 6 day treatment course  Fall -Patient noted to have wandered away from his ALF and was found down outside the facility -Cleared from a trauma standpoint other than thoracic compression fracture (see below) --d/c to SNF rehab   Acute metabolic encephalopathy -Patient presenting with encephalopathy as evidenced by his confusion -Pt is suspected to have underlying mild dementia PTA, although not yet diagnosed -may be contributed by UTI Plan: --treat UTI as above   Possible Dementia -Not previously formally diagnosed, but has reported issues with sundowning while hospitalized in January  -Suspect underlying cognitive impairment which is being increasingly unmasked --will return to ALF memory unit after rehab   Afib --rate controlled Plan: --cont cardizem --cont Pradaxa    Depression -Recently on wellbutrin and lexapro, held on admission --cont to hold both   HLD --cont lipitor   Glaucoma -Continue Alphagan, Trusopt   T-spine compression fracture -Neurosurgery consulted -Per Neurosurgery, no acute neurosurgical intervention indicated at this time, no brace needed, activity as tolerated without restrictions  Hypokalemia --replete with oral potassium     DVT prophylaxis: dabigatran Code Status: DNR Family Communication: daughter updated at bedside today Status is: Inpatient   Inpatient level of care appropriate due to severity of illness  Dispo: The patient is from: ALF              Anticipated d/c is to: SNF              Patient currently is medically stable to d/c.  Waiting on placement.    Difficult to place patient No  Consultants:  Neurosurgery  Procedures:    Antimicrobials: Anti-infectives (From admission, onward)    Start     Dose/Rate Route Frequency Ordered Stop   01/21/21 1700  meropenem (MERREM) 1 g in sodium chloride 0.9 % 100 mL IVPB  Status:  Discontinued        1 g 200 mL/hr over 30 Minutes Intravenous Every 8 hours 01/21/21 0937 01/21/21 1212   01/21/21 1300  fosfomycin (MONUROL) packet 3 g        3 g Oral every 72 hours 01/21/21 1212 01/27/21 1259   01/20/21 1130  meropenem (MERREM) 1 g in sodium chloride 0.9 % 100 mL IVPB  Status:  Discontinued        1 g 200 mL/hr over 30 Minutes  Intravenous Every 12 hours 01/20/21 1036 01/21/21 0937   01/19/21 1315  cefTRIAXone (ROCEPHIN) 1 g in sodium chloride 0.9 % 100 mL IVPB  Status:  Discontinued        1 g 200 mL/hr over 30 Minutes Intravenous Every 24 hours 01/19/21 1223 01/20/21 1032       Subjective: Pt was alert today, visiting with daughter.     Objective: Vitals:   01/22/21 0425 01/22/21 0730 01/22/21 0922 01/22/21 1412  BP: 115/63  135/87 113/65  Pulse: 86  95 91  Resp: 18  16 17   Temp: 97.9 F (36.6 C)   97.6 F (36.4 C)  TempSrc:    Axillary   SpO2: 100% 96% 100% 96%  Weight:      Height:        Intake/Output Summary (Last 24 hours) at 01/22/2021 1448 Last data filed at 01/22/2021 1300 Gross per 24 hour  Intake 1480 ml  Output 500 ml  Net 980 ml   Filed Weights   01/17/21 0722  Weight: 81.6 kg    Examination: Constitutional: NAD, alert, oriented to self HEENT: conjunctivae and lids normal, EOMI  RESP: normal respiratory effort, on RA Neuro: II - XII grossly intact.      Data Reviewed: I have personally reviewed following labs and imaging studies  CBC: Recent Labs  Lab 01/18/21 0341 01/19/21 0156 01/20/21 0137 01/21/21 0203 01/22/21 0129  WBC 9.6 8.1 14.1* 8.7 7.3  HGB 12.1* 11.5* 10.5* 11.3* 11.1*  HCT 35.2* 33.4* 31.6* 33.2* 32.7*  MCV 94.1 95.2 97.8 95.1 96.5  PLT 259 227 191 190 192   Basic Metabolic Panel: Recent Labs  Lab 01/18/21 0341 01/19/21 0156 01/20/21 0137 01/21/21 0203 01/22/21 0129  NA 140 141 140 140 138  K 2.7* 3.2* 4.0 3.5 3.1*  CL 103 108 107 107 103  CO2 22 27 27 25 28   GLUCOSE 71 101* 106* 93 99  BUN 28* 22 21 21 16   CREATININE 1.21 1.12 1.08 0.87 0.99  CALCIUM 9.8 9.4 9.0 9.0 8.7*  MG  --   --   --  1.2* 1.9   GFR: Estimated Creatinine Clearance: 50.6 mL/min (by C-G formula based on SCr of 0.99 mg/dL). Liver Function Tests: Recent Labs  Lab 01/17/21 0725 01/19/21 0156 01/20/21 0137  AST 40 32 25  ALT 24 18 19   ALKPHOS 67 64 49  BILITOT 1.0 0.9 0.9  PROT 6.1* 5.5* 5.1*  ALBUMIN 3.9 3.1* 2.8*   No results for input(s): LIPASE, AMYLASE in the last 168 hours. No results for input(s): AMMONIA in the last 168 hours. Coagulation Profile: Recent Labs  Lab 01/17/21 0725  INR 1.6*   Cardiac Enzymes: Recent Labs  Lab 01/17/21 0725  CKTOTAL 107   BNP (last 3 results) No results for input(s): PROBNP in the last 8760 hours. HbA1C: No results for input(s): HGBA1C in the last 72 hours. CBG: No results for input(s): GLUCAP in the last 168 hours. Lipid  Profile: No results for input(s): CHOL, HDL, LDLCALC, TRIG, CHOLHDL, LDLDIRECT in the last 72 hours. Thyroid Function Tests: No results for input(s): TSH, T4TOTAL, FREET4, T3FREE, THYROIDAB in the last 72 hours. Anemia Panel: No results for input(s): VITAMINB12, FOLATE, FERRITIN, TIBC, IRON, RETICCTPCT in the last 72 hours. Sepsis Labs: Recent Labs  Lab 01/17/21 0725  LATICACIDVEN 3.8*    Recent Results (from the past 240 hour(s))  Resp Panel by RT-PCR (Flu A&B, Covid) Nasopharyngeal Swab     Status: None  Collection Time: 01/17/21  7:25 AM   Specimen: Nasopharyngeal Swab; Nasopharyngeal(NP) swabs in vial transport medium  Result Value Ref Range Status   SARS Coronavirus 2 by RT PCR NEGATIVE NEGATIVE Final    Comment: (NOTE) SARS-CoV-2 target nucleic acids are NOT DETECTED.  The SARS-CoV-2 RNA is generally detectable in upper respiratory specimens during the acute phase of infection. The lowest concentration of SARS-CoV-2 viral copies this assay can detect is 138 copies/mL. A negative result does not preclude SARS-Cov-2 infection and should not be used as the sole basis for treatment or other patient management decisions. A negative result may occur with  improper specimen collection/handling, submission of specimen other than nasopharyngeal swab, presence of viral mutation(s) within the areas targeted by this assay, and inadequate number of viral copies(<138 copies/mL). A negative result must be combined with clinical observations, patient history, and epidemiological information. The expected result is Negative.  Fact Sheet for Patients:  BloggerCourse.com  Fact Sheet for Healthcare Providers:  SeriousBroker.it  This test is no t yet approved or cleared by the [place] FDA and  has been authorized for detection and/or diagnosis of SARS-CoV-2 by FDA under an Emergency Use Authorization (EUA). This EUA will remain  in  effect (meaning this test can be used) for the duration of the COVID-19 declaration under Section 564(b)(1) of the Act, 21 U.S.C.section 360bbb-3(b)(1), unless the authorization is terminated  or revoked sooner.       Influenza A by PCR NEGATIVE NEGATIVE Final   Influenza B by PCR NEGATIVE NEGATIVE Final    Comment: (NOTE) The Xpert Xpress SARS-CoV-2/FLU/RSV plus assay is intended as an aid in the diagnosis of influenza from Nasopharyngeal swab specimens and should not be used as a sole basis for treatment. Nasal washings and aspirates are unacceptable for Xpert Xpress SARS-CoV-2/FLU/RSV testing.  Fact Sheet for Patients: BloggerCourse.com  Fact Sheet for Healthcare Providers: SeriousBroker.it  This test is not yet approved or cleared by the [place] FDA and has been authorized for detection and/or diagnosis of SARS-CoV-2 by FDA under an Emergency Use Authorization (EUA). This EUA will remain in effect (meaning this test can be used) for the duration of the COVID-19 declaration under Section 564(b)(1) of the Act, 21 U.S.C. section 360bbb-3(b)(1), unless the authorization is terminated or revoked.  Performed at [facility], [address], [place], [place] [address]   Culture, Urine     Status: Abnormal   Collection Time: 01/17/21  3:15 PM   Specimen: Urine, Random  Result Value Ref Range Status   Specimen Description URINE, RANDOM  Final   Special Requests   Final    NONE Performed at [facility], [address], [place], [place] [address]    Culture (A)  Final    >=100,000 COLONIES/mL KLEBSIELLA PNEUMONIAE Confirmed Extended Spectrum Beta-Lactamase Producer (ESBL).  In bloodstream infections from ESBL organisms, carbapenems are preferred over piperacillin/tazobactam. They are shown to have a lower risk of mortality.    Report Status 01/20/2021 FINAL  Final   Organism ID, Bacteria KLEBSIELLA PNEUMONIAE  (A)  Final      Susceptibility   Klebsiella pneumoniae - MIC*    AMPICILLIN >=32 RESISTANT Resistant     CEFAZOLIN >=64 RESISTANT Resistant     CEFEPIME >=32 RESISTANT Resistant     CEFTRIAXONE >=64 RESISTANT Resistant     CIPROFLOXACIN >=4 RESISTANT Resistant     GENTAMICIN <=1 SENSITIVE Sensitive     IMIPENEM 0.5 SENSITIVE Sensitive  NITROFURANTOIN 64 INTERMEDIATE Intermediate     TRIMETH/SULFA >=320 RESISTANT Resistant     AMPICILLIN/SULBACTAM >=32 RESISTANT Resistant     PIP/TAZO >=128 RESISTANT Resistant     * >=100,000 COLONIES/mL KLEBSIELLA PNEUMONIAE     Radiology Studies: No results found.  Scheduled Meds:  atorvastatin  10 mg Oral Daily   brimonidine  1 drop Both Eyes TID   dabigatran  150 mg Oral Q12H   diltiazem  30 mg Oral BID   docusate sodium  100 mg Oral BID   dorzolamide  1 drop Both Eyes BID   finasteride  5 mg Oral Daily   fosfomycin  3 g Oral Q72H   pantoprazole  40 mg Oral Daily   Continuous Infusions:     LOS: 4 days   Darlin Priestly, MD [facility] Pager On Amion  If 7PM-7AM, please contact night-coverage 01/22/2021, 2:48 PM

## 2021-01-23 LAB — CBC
HCT: 32.9 % — ABNORMAL LOW (ref 39.0–52.0)
Hemoglobin: 11.3 g/dL — ABNORMAL LOW (ref 13.0–17.0)
MCH: 32.9 pg (ref 26.0–34.0)
MCHC: 34.3 g/dL (ref 30.0–36.0)
MCV: 95.9 fL (ref 80.0–100.0)
Platelets: 195 10*3/uL (ref 150–400)
RBC: 3.43 MIL/uL — ABNORMAL LOW (ref 4.22–5.81)
RDW: 15.5 % (ref 11.5–15.5)
WBC: 6.5 10*3/uL (ref 4.0–10.5)
nRBC: 0 % (ref 0.0–0.2)

## 2021-01-23 LAB — BASIC METABOLIC PANEL
Anion gap: 5 (ref 5–15)
BUN: 14 mg/dL (ref 8–23)
CO2: 28 mmol/L (ref 22–32)
Calcium: 8.7 mg/dL — ABNORMAL LOW (ref 8.9–10.3)
Chloride: 107 mmol/L (ref 98–111)
Creatinine, Ser: 0.88 mg/dL (ref 0.61–1.24)
GFR, Estimated: >60 mL/min (ref >60)
Glucose, Bld: 95 mg/dL (ref 70–99)
Potassium: 3.6 mmol/L (ref 3.5–5.1)
Sodium: 140 mmol/L (ref 135–145)

## 2021-01-23 LAB — MAGNESIUM: Magnesium: 1.7 mg/dL (ref 1.7–2.4)

## 2021-01-23 NOTE — Progress Notes (Signed)
PROGRESS NOTE    Mark Moore  ZOX:[id number] DOB: Jul 04, 1929 DOA: 01/17/2021 PCP: Wanda Plump, MD    Brief Narrative:  87 y.o. male with medical history significant of afib and HLD presenting with a fall.  His wife died in 11-May-2023 and he was hospitalized in January in [place] for FTT.  His family moved him to the [place] in April and he lived with his daughter for a few weeks prior to transitioning to [facility]. Pt was recently treated for UTI with bactrim and also started on Wellbutrin for depression. Pt noted to be increasingly confused over the last week but it acutely worsened 2 days prior to admit.   Assessment & Plan:   Principal Problem:   Fall (on)(from) sidewalk curb, initial encounter Active Problems:   Acute metabolic encephalopathy   Atrial fibrillation, chronic (HCC)   Dementia (HCC)   Depression   Dyslipidemia   Glaucoma   Thoracic compression fracture, closed, initial encounter (HCC)   DNR (do not resuscitate)   Acute encephalopathy  ESBL Klebsiella UTI, POA --started on ceftriaxone on presentation, then switched to meropenem, and then fosfomycin --cont fosfomycin, for 6 day treatment course  Fall -Patient noted to have wandered away from his ALF and was found down outside the facility -Cleared from a trauma standpoint other than thoracic compression fracture (see below) --d/c to SNF rehab   Acute metabolic encephalopathy -Patient presenting with encephalopathy as evidenced by his confusion -Pt is suspected to have underlying mild dementia PTA, although not yet diagnosed -may be contributed by UTI Plan: --treat UTI as above   Possible Dementia -Not previously formally diagnosed, but has reported issues with sundowning while hospitalized in January  -Suspect underlying cognitive impairment which is being increasingly unmasked --will return to ALF memory unit after rehab   Afib --rate controlled Plan: --cont cardizem --cont Pradaxa    Depression -Recently on wellbutrin and lexapro, held on admission --cont to hold both   HLD --cont lipitor   Glaucoma -Continue Alphagan, Trusopt   T-spine compression fracture -Neurosurgery consulted -Per Neurosurgery, no acute neurosurgical intervention indicated at this time, no brace needed, activity as tolerated without restrictions  Hypokalemia --replete with oral potassium     DVT prophylaxis: dabigatran Code Status: DNR Family Communication:  Status is: Inpatient   Inpatient level of care appropriate due to severity of illness  Dispo: The patient is from: ALF              Anticipated d/c is to: SNF              Patient currently is medically stable to d/c.  Waiting on placement.    Difficult to place patient No  Consultants:  Neurosurgery  Procedures:    Antimicrobials: Anti-infectives (From admission, onward)    Start     Dose/Rate Route Frequency Ordered Stop   01/21/21 1700  meropenem (MERREM) 1 g in sodium chloride 0.9 % 100 mL IVPB  Status:  Discontinued        1 g 200 mL/hr over 30 Minutes Intravenous Every 8 hours 01/21/21 0937 01/21/21 1212   01/21/21 1300  fosfomycin (MONUROL) packet 3 g        3 g Oral every 72 hours 01/21/21 1212 01/27/21 1259   01/20/21 1130  meropenem (MERREM) 1 g in sodium chloride 0.9 % 100 mL IVPB  Status:  Discontinued        1 g 200 mL/hr over 30 Minutes Intravenous Every 12 hours  01/20/21 1036 01/21/21 0937   01/19/21 1315  cefTRIAXone (ROCEPHIN) 1 g in sodium chloride 0.9 % 100 mL IVPB  Status:  Discontinued        1 g 200 mL/hr over 30 Minutes Intravenous Every 24 hours 01/19/21 1223 01/20/21 1032       Subjective: No new complaint.  No change.  Waiting on SNF rehab placement.   Objective: Vitals:   01/22/21 0922 01/22/21 1412 01/23/21 0451 01/23/21 1441  BP: 135/87 113/65 (!) 147/82 113/75  Pulse: 95 91 (!) 54 80  Resp: 16 17 18    Temp:  97.6 F (36.4 C) 98.1 F (36.7 C) (!) 97.5 F (36.4 C)   TempSrc:  Axillary  Oral  SpO2: 100% 96% 92% 98%  Weight:      Height:        Intake/Output Summary (Last 24 hours) at 01/23/2021 1531 Last data filed at 01/23/2021 1000 Gross per 24 hour  Intake --  Output 450 ml  Net -450 ml   Filed Weights   01/17/21 0722  Weight: 81.6 kg    Examination: Constitutional: NAD, sleeping but easily awoken  HEENT: conjunctivae and lids normal, EOMI CV: No cyanosis.   RESP: normal respiratory effort, on RA Extremities: No effusions, edema in BLE SKIN: warm, dry Neuro: II - XII grossly intact.     Data Reviewed: I have personally reviewed following labs and imaging studies  CBC: Recent Labs  Lab 01/19/21 0156 01/20/21 0137 01/21/21 0203 01/22/21 0129 01/23/21 0230  WBC 8.1 14.1* 8.7 7.3 6.5  HGB 11.5* 10.5* 11.3* 11.1* 11.3*  HCT 33.4* 31.6* 33.2* 32.7* 32.9*  MCV 95.2 97.8 95.1 96.5 95.9  PLT 227 191 190 192 195   Basic Metabolic Panel: Recent Labs  Lab 01/19/21 0156 01/20/21 0137 01/21/21 0203 01/22/21 0129 01/23/21 0230  NA 141 140 140 138 140  K 3.2* 4.0 3.5 3.1* 3.6  CL 108 107 107 103 107  CO2 27 27 25 28 28   GLUCOSE 101* 106* 93 99 95  BUN 22 21 21 16 14   CREATININE 1.12 1.08 0.87 0.99 0.88  CALCIUM 9.4 9.0 9.0 8.7* 8.7*  MG  --   --  1.2* 1.9 1.7   GFR: Estimated Creatinine Clearance: 56.9 mL/min (by C-G formula based on SCr of 0.88 mg/dL). Liver Function Tests: Recent Labs  Lab 01/17/21 0725 01/19/21 0156 01/20/21 0137  AST 40 32 25  ALT 24 18 19   ALKPHOS 67 64 49  BILITOT 1.0 0.9 0.9  PROT 6.1* 5.5* 5.1*  ALBUMIN 3.9 3.1* 2.8*   No results for input(s): LIPASE, AMYLASE in the last 168 hours. No results for input(s): AMMONIA in the last 168 hours. Coagulation Profile: Recent Labs  Lab 01/17/21 0725  INR 1.6*   Cardiac Enzymes: Recent Labs  Lab 01/17/21 0725  CKTOTAL 107   BNP (last 3 results) No results for input(s): PROBNP in the last 8760 hours. HbA1C: No results for input(s): HGBA1C  in the last 72 hours. CBG: No results for input(s): GLUCAP in the last 168 hours. Lipid Profile: No results for input(s): CHOL, HDL, LDLCALC, TRIG, CHOLHDL, LDLDIRECT in the last 72 hours. Thyroid Function Tests: No results for input(s): TSH, T4TOTAL, FREET4, T3FREE, THYROIDAB in the last 72 hours. Anemia Panel: No results for input(s): VITAMINB12, FOLATE, FERRITIN, TIBC, IRON, RETICCTPCT in the last 72 hours. Sepsis Labs: Recent Labs  Lab 01/17/21 0725  LATICACIDVEN 3.8*    Recent Results (from the past 240 hour(s))  Resp  Panel by RT-PCR (Flu A&B, Covid) Nasopharyngeal Swab     Status: None   Collection Time: 01/17/21  7:25 AM   Specimen: Nasopharyngeal Swab; Nasopharyngeal(NP) swabs in vial transport medium  Result Value Ref Range Status   SARS Coronavirus 2 by RT PCR NEGATIVE NEGATIVE Final    Comment: (NOTE) SARS-CoV-2 target nucleic acids are NOT DETECTED.  The SARS-CoV-2 RNA is generally detectable in upper respiratory specimens during the acute phase of infection. The lowest concentration of SARS-CoV-2 viral copies this assay can detect is 138 copies/mL. A negative result does not preclude SARS-Cov-2 infection and should not be used as the sole basis for treatment or other patient management decisions. A negative result may occur with  improper specimen collection/handling, submission of specimen other than nasopharyngeal swab, presence of viral mutation(s) within the areas targeted by this assay, and inadequate number of viral copies(<138 copies/mL). A negative result must be combined with clinical observations, patient history, and epidemiological information. The expected result is Negative.  Fact Sheet for Patients:  BloggerCourse.com  Fact Sheet for Healthcare Providers:  SeriousBroker.it  This test is no t yet approved or cleared by the [place] FDA and  has been authorized for detection and/or diagnosis  of SARS-CoV-2 by FDA under an Emergency Use Authorization (EUA). This EUA will remain  in effect (meaning this test can be used) for the duration of the COVID-19 declaration under Section 564(b)(1) of the Act, 21 U.S.C.section 360bbb-3(b)(1), unless the authorization is terminated  or revoked sooner.       Influenza A by PCR NEGATIVE NEGATIVE Final   Influenza B by PCR NEGATIVE NEGATIVE Final    Comment: (NOTE) The Xpert Xpress SARS-CoV-2/FLU/RSV plus assay is intended as an aid in the diagnosis of influenza from Nasopharyngeal swab specimens and should not be used as a sole basis for treatment. Nasal washings and aspirates are unacceptable for Xpert Xpress SARS-CoV-2/FLU/RSV testing.  Fact Sheet for Patients: BloggerCourse.com  Fact Sheet for Healthcare Providers: SeriousBroker.it  This test is not yet approved or cleared by the [place] FDA and has been authorized for detection and/or diagnosis of SARS-CoV-2 by FDA under an Emergency Use Authorization (EUA). This EUA will remain in effect (meaning this test can be used) for the duration of the COVID-19 declaration under Section 564(b)(1) of the Act, 21 U.S.C. section 360bbb-3(b)(1), unless the authorization is terminated or revoked.  Performed at [facility], [address] [place], [place] [address]   Culture, Urine     Status: Abnormal   Collection Time: 01/17/21  3:15 PM   Specimen: Urine, Random  Result Value Ref Range Status   Specimen Description URINE, RANDOM  Final   Special Requests   Final    NONE Performed at [facility], [address], [place], [place] [address]    Culture (A)  Final    >=100,000 COLONIES/mL KLEBSIELLA PNEUMONIAE Confirmed Extended Spectrum Beta-Lactamase Producer (ESBL).  In bloodstream infections from ESBL organisms, carbapenems are preferred over piperacillin/tazobactam. They are shown to have a lower risk of  mortality.    Report Status 01/20/2021 FINAL  Final   Organism ID, Bacteria KLEBSIELLA PNEUMONIAE (A)  Final      Susceptibility   Klebsiella pneumoniae - MIC*    AMPICILLIN >=32 RESISTANT Resistant     CEFAZOLIN >=64 RESISTANT Resistant     CEFEPIME >=32 RESISTANT Resistant     CEFTRIAXONE >=64 RESISTANT Resistant     CIPROFLOXACIN >=4 RESISTANT Resistant  GENTAMICIN <=1 SENSITIVE Sensitive     IMIPENEM 0.5 SENSITIVE Sensitive     NITROFURANTOIN 64 INTERMEDIATE Intermediate     TRIMETH/SULFA >=320 RESISTANT Resistant     AMPICILLIN/SULBACTAM >=32 RESISTANT Resistant     PIP/TAZO >=128 RESISTANT Resistant     * >=100,000 COLONIES/mL KLEBSIELLA PNEUMONIAE     Radiology Studies: No results found.  Scheduled Meds:  atorvastatin  10 mg Oral Daily   brimonidine  1 drop Both Eyes TID   dabigatran  150 mg Oral Q12H   diltiazem  30 mg Oral BID   docusate sodium  100 mg Oral BID   dorzolamide  1 drop Both Eyes BID   finasteride  5 mg Oral Daily   fosfomycin  3 g Oral Q72H   pantoprazole  40 mg Oral Daily   QUEtiapine  50 mg Oral QHS   Continuous Infusions:     LOS: 5 days   Darlin Priestly, MD [facility] Pager On Amion  If 7PM-7AM, please contact night-coverage 01/23/2021, 3:31 PM

## 2021-01-24 LAB — CBC
HCT: 32.6 % — ABNORMAL LOW (ref 39.0–52.0)
Hemoglobin: 10.8 g/dL — ABNORMAL LOW (ref 13.0–17.0)
MCH: 32 pg (ref 26.0–34.0)
MCHC: 33.1 g/dL (ref 30.0–36.0)
MCV: 96.4 fL (ref 80.0–100.0)
Platelets: 214 10*3/uL (ref 150–400)
RBC: 3.38 MIL/uL — ABNORMAL LOW (ref 4.22–5.81)
RDW: 15.4 % (ref 11.5–15.5)
WBC: 5.9 10*3/uL (ref 4.0–10.5)
nRBC: 0 % (ref 0.0–0.2)

## 2021-01-24 LAB — BASIC METABOLIC PANEL
Anion gap: 5 (ref 5–15)
BUN: 12 mg/dL (ref 8–23)
CO2: 29 mmol/L (ref 22–32)
Calcium: 8.7 mg/dL — ABNORMAL LOW (ref 8.9–10.3)
Chloride: 106 mmol/L (ref 98–111)
Creatinine, Ser: 0.79 mg/dL (ref 0.61–1.24)
GFR, Estimated: >60 mL/min (ref >60)
Glucose, Bld: 84 mg/dL (ref 70–99)
Potassium: 3.4 mmol/L — ABNORMAL LOW (ref 3.5–5.1)
Sodium: 140 mmol/L (ref 135–145)

## 2021-01-24 LAB — MAGNESIUM: Magnesium: 1.7 mg/dL (ref 1.7–2.4)

## 2021-01-24 MED ORDER — POTASSIUM CHLORIDE CRYS ER 20 MEQ PO TBCR
40.0000 meq | EXTENDED_RELEASE_TABLET | Freq: Once | ORAL | Status: AC
  Administered 2021-01-24: 40 meq via ORAL
  Filled 2021-01-24: qty 2

## 2021-01-24 MED ORDER — QUETIAPINE FUMARATE 25 MG PO TABS
25.0000 mg | ORAL_TABLET | Freq: Every day | ORAL | Status: DC
  Administered 2021-01-24 – 2021-01-29 (×6): 25 mg via ORAL
  Filled 2021-01-24 (×6): qty 1

## 2021-01-24 NOTE — Progress Notes (Signed)
PROGRESS NOTE    Mark BumpersManuel Alvarez Moore  ZOX:[id number] DOB: 11/16/1928 DOA: 01/17/2021 PCP: Wanda PlumpPaz, Jose E, MD    Brief Narrative:  87 y.o. male with medical history significant of afib and HLD presenting with a fall.  His wife died in September and he was hospitalized in January in [place] for FTT.  His family [place] the [place] in [place] and he lived with his daughter for a few weeks prior to transitioning to [facility]. Pt was recently treated for UTI with bactrim and also started on Wellbutrin for depression. Pt noted to be increasingly confused over the last week but it acutely worsened 2 days prior to admit.   Assessment & Plan:   Principal Problem:   Fall (on)(from) sidewalk curb, initial encounter Active Problems:   Acute metabolic encephalopathy   Atrial fibrillation, chronic (HCC)   Dementia (HCC)   Depression   Dyslipidemia   Glaucoma   Thoracic compression fracture, closed, initial encounter (HCC)   DNR (do not resuscitate)   Acute encephalopathy  ESBL Klebsiella UTI, POA --started on ceftriaxone on presentation, then switched to meropenem, and then fosfomycin --cont fosfomycin, for 6 day treatment course  Fall -Patient noted to have wandered away from his ALF and was found down outside the facility -Cleared from a trauma standpoint other than thoracic compression fracture (see below) --PT rec SNF rehab   Acute metabolic encephalopathy, POA Likely hospital delirium -Patient presenting with encephalopathy as evidenced by his confusion -Pt is suspected to have underlying mild dementia PTA, although not yet diagnosed -may be contributed by UTI --agitated at night requiring intermittent IV haldol, then started on seroquel Plan: --treat UTI as above --reduce seroquel to 25 mg nightly due to concern for excessive somnolence  Possible Dementia -Not previously formally diagnosed, but has reported issues with sundowning while hospitalized in January  -Suspect  underlying cognitive impairment which is being increasingly unmasked --will return to ALF memory unit after rehab   Afib --rate controlled Plan: --cont cardizem --cont Pradaxa   Depression -Recently on wellbutrin and lexapro, held on admission --cont to hold both   HLD --cont lipitor   Glaucoma -Continue Alphagan, Trusopt   T-spine compression fracture -Neurosurgery consulted -Per Neurosurgery, no acute neurosurgical intervention indicated at this time, no brace needed, activity as tolerated without restrictions  Hypokalemia --replete with oral potassium     DVT prophylaxis: dabigatran Code Status: DNR Family Communication:  Status is: Inpatient   Inpatient level of care appropriate due to severity of illness  Dispo: The patient is from: ALF              Anticipated d/c is to: SNF              Patient currently is medically stable to d/c.  Waiting on placement.    Difficult to place patient No  Consultants:  Neurosurgery  Procedures:    Antimicrobials: Anti-infectives (From admission, onward)    Start     Dose/Rate Route Frequency Ordered Stop   01/21/21 1700  meropenem (MERREM) 1 g in sodium chloride 0.9 % 100 mL IVPB  Status:  Discontinued        1 g 200 mL/hr over 30 Minutes Intravenous Every 8 hours 01/21/21 0937 01/21/21 1212   01/21/21 1300  fosfomycin (MONUROL) packet 3 g        3 g Oral every 72 hours 01/21/21 1212 01/27/21 1259   01/20/21 1130  meropenem (MERREM) 1 g in sodium chloride 0.9 %  100 mL IVPB  Status:  Discontinued        1 g 200 mL/hr over 30 Minutes Intravenous Every 12 hours 01/20/21 1036 01/21/21 0937   01/19/21 1315  cefTRIAXone (ROCEPHIN) 1 g in sodium chloride 0.9 % 100 mL IVPB  Status:  Discontinued        1 g 200 mL/hr over 30 Minutes Intravenous Every 24 hours 01/19/21 1223 01/20/21 1032       Subjective: Daughter reported pt more somnolent.  Pt able to get to bedside commode with help.    Objective: Vitals:    01/23/21 0451 01/23/21 1441 01/24/21 0439 01/24/21 1331  BP: (!) 147/82 113/75 140/82 118/77  Pulse: (!) 54 80 (!) 58 84  Resp: 18  19   Temp: 98.1 F (36.7 C) (!) 97.5 F (36.4 C) 97.9 F (36.6 C) 98.3 F (36.8 C)  TempSrc:  Oral    SpO2: 92% 98% 95% 96%  Weight:      Height:        Intake/Output Summary (Last 24 hours) at 01/24/2021 1532 Last data filed at 01/23/2021 2335 Gross per 24 hour  Intake --  Output 400 ml  Net -400 ml   Filed Weights   01/17/21 0722  Weight: 81.6 kg    Examination: Constitutional: NAD, alert, oriented to person and place HEENT: conjunctivae and lids normal, EOMI CV: No cyanosis.   RESP: normal respiratory effort, on RA Neuro: II - XII grossly intact.     Data Reviewed: I have personally reviewed following labs and imaging studies  CBC: Recent Labs  Lab 01/20/21 0137 01/21/21 0203 01/22/21 0129 01/23/21 0230 01/24/21 0200  WBC 14.1* 8.7 7.3 6.5 5.9  HGB 10.5* 11.3* 11.1* 11.3* 10.8*  HCT 31.6* 33.2* 32.7* 32.9* 32.6*  MCV 97.8 95.1 96.5 95.9 96.4  PLT 191 190 192 195 214   Basic Metabolic Panel: Recent Labs  Lab 01/20/21 0137 01/21/21 0203 01/22/21 0129 01/23/21 0230 01/24/21 0200  NA 140 140 138 140 140  K 4.0 3.5 3.1* 3.6 3.4*  CL 107 107 103 107 106  CO2 27 25 28 28 29   GLUCOSE 106* 93 99 95 84  BUN 21 21 16 14 12   CREATININE 1.08 0.87 0.99 0.88 0.79  CALCIUM 9.0 9.0 8.7* 8.7* 8.7*  MG  --  1.2* 1.9 1.7 1.7   GFR: Estimated Creatinine Clearance: 62.6 mL/min (by C-G formula based on SCr of 0.79 mg/dL). Liver Function Tests: Recent Labs  Lab 01/19/21 0156 01/20/21 0137  AST 32 25  ALT 18 19  ALKPHOS 64 49  BILITOT 0.9 0.9  PROT 5.5* 5.1*  ALBUMIN 3.1* 2.8*   No results for input(s): LIPASE, AMYLASE in the last 168 hours. No results for input(s): AMMONIA in the last 168 hours. Coagulation Profile: No results for input(s): INR, PROTIME in the last 168 hours.  Cardiac Enzymes: No results for input(s):  CKTOTAL, CKMB, CKMBINDEX, TROPONINI in the last 168 hours.  BNP (last 3 results) No results for input(s): PROBNP in the last 8760 hours. HbA1C: No results for input(s): HGBA1C in the last 72 hours. CBG: No results for input(s): GLUCAP in the last 168 hours. Lipid Profile: No results for input(s): CHOL, HDL, LDLCALC, TRIG, CHOLHDL, LDLDIRECT in the last 72 hours. Thyroid Function Tests: No results for input(s): TSH, T4TOTAL, FREET4, T3FREE, THYROIDAB in the last 72 hours. Anemia Panel: No results for input(s): VITAMINB12, FOLATE, FERRITIN, TIBC, IRON, RETICCTPCT in the last 72 hours. Sepsis Labs: No results  for input(s): PROCALCITON, LATICACIDVEN in the last 168 hours.   Recent Results (from the past 240 hour(s))  Resp Panel by RT-PCR (Flu A&B, Covid) Nasopharyngeal Swab     Status: None   Collection Time: 01/17/21  7:25 AM   Specimen: Nasopharyngeal Swab; Nasopharyngeal(NP) swabs in vial transport medium  Result Value Ref Range Status   SARS Coronavirus 2 by RT PCR NEGATIVE NEGATIVE Final    Comment: (NOTE) SARS-CoV-2 target nucleic acids are NOT DETECTED.  The SARS-CoV-2 RNA is generally detectable in upper respiratory specimens during the acute phase of infection. The lowest concentration of SARS-CoV-2 viral copies this assay can detect is 138 copies/mL. A negative result does not preclude SARS-Cov-2 infection and should not be used as the sole basis for treatment or other patient management decisions. A negative result may occur with  improper specimen collection/handling, submission of specimen other than nasopharyngeal swab, presence of viral mutation(s) within the areas targeted by this assay, and inadequate number of viral copies(<138 copies/mL). A negative result must be combined with clinical observations, patient history, and epidemiological information. The expected result is Negative.  Fact Sheet for Patients:  BloggerCourse.com  Fact  Sheet for Healthcare Providers:  SeriousBroker.it  This test is no t yet approved or cleared by the [place] FDA and  has been authorized for detection and/or diagnosis of SARS-CoV-2 by FDA under an Emergency Use Authorization (EUA). This EUA will remain  in effect (meaning this test can be used) for the duration of the COVID-19 declaration under Section 564(b)(1) of the Act, 21 U.S.C.section 360bbb-3(b)(1), unless the authorization is terminated  or revoked sooner.       Influenza A by PCR NEGATIVE NEGATIVE Final   Influenza B by PCR NEGATIVE NEGATIVE Final    Comment: (NOTE) The Xpert Xpress SARS-CoV-2/FLU/RSV plus assay is intended as an aid in the diagnosis of influenza from Nasopharyngeal swab specimens and should not be used as a sole basis for treatment. Nasal washings and aspirates are unacceptable for Xpert Xpress SARS-CoV-2/FLU/RSV testing.  Fact Sheet for Patients: BloggerCourse.com  Fact Sheet for Healthcare Providers: SeriousBroker.it  This test is not yet approved or cleared by the [place] FDA and has been authorized for detection and/or diagnosis of SARS-CoV-2 by FDA under an Emergency Use Authorization (EUA). This EUA will remain in effect (meaning this test can be used) for the duration of the COVID-19 declaration under Section 564(b)(1) of the Act, 21 U.S.C. section 360bbb-3(b)(1), unless the authorization is terminated or revoked.  Performed at [facility], [address], [place], [place] [address]   Culture, Urine     Status: Abnormal   Collection Time: 01/17/21  3:15 PM   Specimen: Urine, Random  Result Value Ref Range Status   Specimen Description URINE, RANDOM  Final   Special Requests   Final    NONE Performed at [facility], [address] [place], [place] [address]    Culture (A)  Final    >=100,000 COLONIES/mL KLEBSIELLA PNEUMONIAE Confirmed  Extended Spectrum Beta-Lactamase Producer (ESBL).  In bloodstream infections from ESBL organisms, carbapenems are preferred over piperacillin/tazobactam. They are shown to have a lower risk of mortality.    Report Status 01/20/2021 FINAL  Final   Organism ID, Bacteria KLEBSIELLA PNEUMONIAE (A)  Final      Susceptibility   Klebsiella pneumoniae - MIC*    AMPICILLIN >=32 RESISTANT Resistant     CEFAZOLIN >=64 RESISTANT Resistant     CEFEPIME >=32 RESISTANT  Resistant     CEFTRIAXONE >=64 RESISTANT Resistant     CIPROFLOXACIN >=4 RESISTANT Resistant     GENTAMICIN <=1 SENSITIVE Sensitive     IMIPENEM 0.5 SENSITIVE Sensitive     NITROFURANTOIN 64 INTERMEDIATE Intermediate     TRIMETH/SULFA >=320 RESISTANT Resistant     AMPICILLIN/SULBACTAM >=32 RESISTANT Resistant     PIP/TAZO >=128 RESISTANT Resistant     * >=100,000 COLONIES/mL KLEBSIELLA PNEUMONIAE     Radiology Studies: No results found.  Scheduled Meds:  atorvastatin  10 mg Oral Daily   brimonidine  1 drop Both Eyes TID   dabigatran  150 mg Oral Q12H   diltiazem  30 mg Oral BID   docusate sodium  100 mg Oral BID   dorzolamide  1 drop Both Eyes BID   finasteride  5 mg Oral Daily   fosfomycin  3 g Oral Q72H   pantoprazole  40 mg Oral Daily   QUEtiapine  25 mg Oral QHS   Continuous Infusions:     LOS: 6 days   Darlin Priestly, MD [facility] Pager On Amion  If 7PM-7AM, please contact night-coverage 01/24/2021, 3:32 PM

## 2021-01-24 NOTE — TOC Progression Note (Addendum)
Transition of Care [facility]) - Progression Note    Patient Details  Name: Mark Moore MRN: [id number] Date of Birth: [birth year]  [facility]) CM/SW Contact  Carley Hammed, [place] Phone Number: 01/24/2021, 12:10 PM  Clinical Narrative:    CSW was made aware that the plan is for pt to go to SNF before returning to ALF Memory Care. [facility] was the first choice, however they have declined at this time. There are no further bed offers, pt has been faxed out to every facility in the hub. The most notable barrier is his having Generic Medicare, which is difficult to get SNF placement with. SW will continue to follow for DC planning.   Expected Discharge Plan: Skilled Nursing Facility Barriers to Discharge: Continued Medical Work up  Expected Discharge Plan and Services Expected Discharge Plan: Skilled Nursing Facility In-house Referral: Clinical Social Work     Living arrangements for the past 2 months: Assisted Living Facility                                       Social Determinants of Health (SDOH) Interventions    Readmission Risk Interventions No flowsheet data found.

## 2021-01-24 NOTE — Progress Notes (Signed)
Pts daughter was concerned that his bedtime medication may be too strongbc he was extremely lethargic. MD notified and medications adjusted.

## 2021-01-25 LAB — MAGNESIUM: Magnesium: 1.7 mg/dL (ref 1.7–2.4)

## 2021-01-25 LAB — BASIC METABOLIC PANEL
Anion gap: 7 (ref 5–15)
BUN: 15 mg/dL (ref 8–23)
CO2: 27 mmol/L (ref 22–32)
Calcium: 9 mg/dL (ref 8.9–10.3)
Chloride: 102 mmol/L (ref 98–111)
Creatinine, Ser: 0.85 mg/dL (ref 0.61–1.24)
GFR, Estimated: >60 mL/min (ref >60)
Glucose, Bld: 77 mg/dL (ref 70–99)
Potassium: 3.4 mmol/L — ABNORMAL LOW (ref 3.5–5.1)
Sodium: 136 mmol/L (ref 135–145)

## 2021-01-25 LAB — CBC
HCT: 34.5 % — ABNORMAL LOW (ref 39.0–52.0)
Hemoglobin: 11.4 g/dL — ABNORMAL LOW (ref 13.0–17.0)
MCH: 31.8 pg (ref 26.0–34.0)
MCHC: 33 g/dL (ref 30.0–36.0)
MCV: 96.4 fL (ref 80.0–100.0)
Platelets: 239 10*3/uL (ref 150–400)
RBC: 3.58 MIL/uL — ABNORMAL LOW (ref 4.22–5.81)
RDW: 15.3 % (ref 11.5–15.5)
WBC: 6.3 10*3/uL (ref 4.0–10.5)
nRBC: 0 % (ref 0.0–0.2)

## 2021-01-25 MED ORDER — POTASSIUM CHLORIDE CRYS ER 20 MEQ PO TBCR
40.0000 meq | EXTENDED_RELEASE_TABLET | Freq: Once | ORAL | Status: AC
  Administered 2021-01-25: 40 meq via ORAL
  Filled 2021-01-25: qty 2

## 2021-01-25 NOTE — Progress Notes (Signed)
Physical Therapy Treatment Patient Details Name: Mark Moore MRN: [id number] DOB: 01/03/1929 Today's Date: 01/25/2021    History of Present Illness 87 y.o. male presented 01/17/21 with a fall and confusion. T12 compression fx (neurosurgery rec no intervention, no brace needed), ?UTI vs side effects of new med;  PMH significant of afib and HLD, wife died in 2023/04/25 and hospitalized in January with FTT (in [place] family moved him to ALF in [place]; dementia    PT Comments    Patient much improved from previous visit. Confusion improved and briskly following instructions (except for occasional delay due to decreased hearing). Ambulating with  RW and minguard assist up to 80 ft.    Follow Up Recommendations  SNF;Supervision/Assistance - 24 hour     Equipment Recommendations  None recommended by PT    Recommendations for Other Services       Precautions / Restrictions Precautions Precautions: Fall Precaution Comments: back precautions for comfort    Mobility  Bed Mobility               General bed mobility comments: up in recliner    Transfers Overall transfer level: Needs assistance Equipment used: Rolling walker (2 wheeled) Transfers: Sit to/from Stand Sit to Stand: Min guard         General transfer comment: vc for hand placement  Ambulation/Gait Ambulation/Gait assistance: Min guard Gait Distance (Feet): 80 Feet (seated exercises; 60 ft) Assistive device: Rolling walker (2 wheeled) Gait Pattern/deviations: Step-through pattern;Narrow base of support Gait velocity: decr   General Gait Details: velocity improved, no lean to his right; vc for safe use of RW (especially with turns, practicing both rt and lt turns)   Optometrist    Modified Rankin (Stroke Patients Only)       Balance Overall balance assessment: Needs assistance Sitting-balance support: No upper extremity supported;Feet supported Sitting  balance-Leahy Scale: Fair Sitting balance - Comments: supervision for safety   Standing balance support: Bilateral upper extremity supported Standing balance-Leahy Scale: Poor Standing balance comment: bil UE support via RW                            Cognition Arousal/Alertness: Awake/alert Behavior During Therapy: WFL for tasks assessed/performed Overall Cognitive Status: History of cognitive impairments - at baseline                                 General Comments: daughter reports his cognition has been significantly improved x 4 days      Exercises General Exercises - Lower Extremity Long Arc Quad: Both;10 reps Hip Flexion/Marching: Both;10 reps Other Exercises Other Exercises: sit to stand x 8 consecutively; vc for hand placement with RW    General Comments General comments (skin integrity, edema, etc.): daughter present and interpreted (she is a medical interpreter for [employer])      Pertinent Vitals/Pain Pain Assessment: No/denies pain Faces Pain Scale: No hurt    Home Living                      Prior Function            PT Goals (current goals can now be found in the care plan section) Acute Rehab PT Goals Patient Stated Goal: wants to walk Time For Goal Achievement: 02/01/21  Potential to Achieve Goals: Good Progress towards PT goals: Progressing toward goals    Frequency    Min 2X/week      PT Plan Current plan remains appropriate    Co-evaluation              AM-PAC PT "6 Clicks" Mobility   Outcome Measure  Help needed turning from your back to your side while in a flat bed without using bedrails?: A Little Help needed moving from lying on your back to sitting on the side of a flat bed without using bedrails?: A Little Help needed moving to and from a bed to a chair (including a wheelchair)?: A Little Help needed standing up from a chair using your arms (e.g., wheelchair or bedside chair)?: A  Little Help needed to walk in hospital room?: A Little Help needed climbing 3-5 steps with a railing? : A Lot 6 Click Score: 17    End of Session Equipment Utilized During Treatment: Gait belt Activity Tolerance: Patient tolerated treatment well Patient left: in chair;with call bell/phone within reach;with chair alarm set;with family/visitor present Nurse Communication: Mobility status PT Visit Diagnosis: Unsteadiness on feet (R26.81);History of falling (Z91.81)     Time: 2992-4268 PT Time Calculation (min) (ACUTE ONLY): 21 min  Charges:  $Gait Training: 8-22 mins                      Jerolyn Center, PT Pager [phone]    Zena Amos 01/25/2021, 10:07 AM

## 2021-01-25 NOTE — TOC Progression Note (Signed)
Transition of Care [facility]) - Progression Note    Patient Details  Name: Mark Moore MRN: [id number] Date of Birth: 12-Apr-1929  [facility] CM/SW Contact  Carley Hammed, [place] Phone Number: 01/25/2021, 3:43 PM  Clinical Narrative:    CSW continued search for SNF placement. Multiple calls were made, however no facility accepted the insurance on file. CSW called the number on the insurance card to see if they contracted with anyone, CSW was informed that they only contract in [place] and are not available in the [place]. They also noted that the policy expires on the 30th of June. CSW followed up with daughter who provided new insurance information, however the new policy does not go into affect until July 1st. [name] and daughter confirmed they still want [name], [name] was given new insurance info, and they will review pt when insurance becomes available.  CSW spoke with admitting to attempt to provide new insurance. They stated new info could not be uploaded until it was active. TOC will need to update admitting on the first with new information, and resubmit referral to [facility] for review. SW will continue to follow for DC needs.    Expected Discharge Plan: Skilled Nursing Facility Barriers to Discharge: Continued Medical Work up  Expected Discharge Plan and Services Expected Discharge Plan: Skilled Nursing Facility In-house Referral: Clinical Social Work     Living arrangements for the past 2 months: Assisted Living Facility                                       Social Determinants of Health (SDOH) Interventions    Readmission Risk Interventions No flowsheet data found.

## 2021-01-25 NOTE — Progress Notes (Addendum)
PROGRESS NOTE    Mark Moore  BJY:[id number] DOB: 12/10/1928 DOA: 01/17/2021 PCP: Wanda PlumpPaz, Jose E, MD    Brief Narrative:  87 y.o. male with medical history significant of afib and HLD presenting with a fall.  His wife died in September and he was hospitalized in January in [place] for FTT.  His [place] the [place] in [place] and he lived with his daughter for a few weeks prior to transitioning to [facility]. Pt was recently treated for UTI with bactrim and also started on Wellbutrin for depression. Pt noted to be increasingly confused over the last week but it acutely worsened 2 days prior to admit.   Assessment & Plan:   Principal Problem:   Fall (on)(from) sidewalk curb, initial encounter Active Problems:   Acute metabolic encephalopathy   Atrial fibrillation, chronic (HCC)   Dementia (HCC)   Depression   Dyslipidemia   Glaucoma   Thoracic compression fracture, closed, initial encounter (HCC)   DNR (do not resuscitate)   Acute encephalopathy  ESBL Klebsiella UTI, POA --started on ceftriaxone on presentation, then switched to meropenem, and then fosfomycin x2 doses, completed treatment.  Fall -Patient noted to have wandered away from his ALF and was found down outside the facility -Cleared from a trauma standpoint other than thoracic compression fracture (see below) --PT rec SNF rehab   Acute metabolic encephalopathy, POA, improved Likely hospital delirium -Patient presenting with encephalopathy as evidenced by his confusion -Pt is suspected to have underlying mild dementia PTA, although not yet diagnosed -may be contributed by UTI --agitated at night requiring intermittent IV haldol, then started on seroquel Plan: --cont seroquel 25 mg nightly  Possible Dementia -Not previously formally diagnosed, but has reported issues with sundowning while hospitalized in January  -Suspect underlying cognitive impairment which is being increasingly  unmasked --will return to ALF memory unit after rehab   Afib --rate controlled Plan: --cont cardizem --cont Pradaxa   Depression -Recently on wellbutrin and lexapro, held on admission --cont to hold both   HLD --cont lipitor   Glaucoma -Continue Alphagan, Trusopt   T-spine compression fracture -Neurosurgery consulted -Per Neurosurgery, no acute neurosurgical intervention indicated at this time, no brace needed, activity as tolerated without restrictions  Hypokalemia --replete with oral potassium     DVT prophylaxis: dabigatran Code Status: DNR Family Communication: daughter updated on the phone today Status is: Inpatient   Inpatient level of care appropriate due to severity of illness  Dispo: The patient is from: ALF              Anticipated d/c is to: SNF              Patient currently is medically stable to d/c.  Waiting on placement.    Difficult to place patient Yes pt's insurance was not accepted by most SNF  Consultants:  Neurosurgery  Procedures:    Antimicrobials: Anti-infectives (From admission, onward)    Start     Dose/Rate Route Frequency Ordered Stop   01/21/21 1700  meropenem (MERREM) 1 g in sodium chloride 0.9 % 100 mL IVPB  Status:  Discontinued        1 g 200 mL/hr over 30 Minutes Intravenous Every 8 hours 01/21/21 0937 01/21/21 1212   01/21/21 1300  fosfomycin (MONUROL) packet 3 g        3 g Oral every 72 hours 01/21/21 1212 01/24/21 2200   01/20/21 1130  meropenem (MERREM) 1 g in sodium chloride 0.9 %  100 mL IVPB  Status:  Discontinued        1 g 200 mL/hr over 30 Minutes Intravenous Every 12 hours 01/20/21 1036 01/21/21 0937   01/19/21 1315  cefTRIAXone (ROCEPHIN) 1 g in sodium chloride 0.9 % 100 mL IVPB  Status:  Discontinued        1 g 200 mL/hr over 30 Minutes Intravenous Every 24 hours 01/19/21 1223 01/20/21 1032       Subjective: Seroquel reduced last night, pt appeared more alert today.  RN reported pt has been eating well.      Objective: Vitals:   01/24/21 1331 01/24/21 2142 01/25/21 0407 01/25/21 0807  BP: 118/77 (!) 137/93 (!) 136/94 114/70  Pulse: 84 (!) 101 92 93  Resp:  20 20 20   Temp: 98.3 F (36.8 C) 97.8 F (36.6 C) 98 F (36.7 C) 98.1 F (36.7 C)  TempSrc:  Oral  Oral  SpO2: 96% (!) 85% 94% 97%  Weight:      Height:        Intake/Output Summary (Last 24 hours) at 01/25/2021 1453 Last data filed at 01/25/2021 0800 Gross per 24 hour  Intake 240 ml  Output 1300 ml  Net -1060 ml   Filed Weights   01/17/21 0722  Weight: 81.6 kg    Examination: Constitutional: NAD, alert, sitting in chair HEENT: conjunctivae and lids normal, EOMI CV: No cyanosis.   RESP: normal respiratory effort, on RA Extremities: edema in BLE with extensive bruising  SKIN: warm, dry Neuro: II - XII grossly intact.     Data Reviewed: I have personally reviewed following labs and imaging studies  CBC: Recent Labs  Lab 01/21/21 0203 01/22/21 0129 01/23/21 0230 01/24/21 0200 01/25/21 0223  WBC 8.7 7.3 6.5 5.9 6.3  HGB 11.3* 11.1* 11.3* 10.8* 11.4*  HCT 33.2* 32.7* 32.9* 32.6* 34.5*  MCV 95.1 96.5 95.9 96.4 96.4  PLT 190 192 195 214 239   Basic Metabolic Panel: Recent Labs  Lab 01/21/21 0203 01/22/21 0129 01/23/21 0230 01/24/21 0200 01/25/21 0223  NA 140 138 140 140 136  K 3.5 3.1* 3.6 3.4* 3.4*  CL 107 103 107 106 102  CO2 25 28 28 29 27   GLUCOSE 93 99 95 84 77  BUN 21 16 14 12 15   CREATININE 0.87 0.99 0.88 0.79 0.85  CALCIUM 9.0 8.7* 8.7* 8.7* 9.0  MG 1.2* 1.9 1.7 1.7 1.7   GFR: Estimated Creatinine Clearance: 58.9 mL/min (by C-G formula based on SCr of 0.85 mg/dL). Liver Function Tests: Recent Labs  Lab 01/19/21 0156 01/20/21 0137  AST 32 25  ALT 18 19  ALKPHOS 64 49  BILITOT 0.9 0.9  PROT 5.5* 5.1*  ALBUMIN 3.1* 2.8*   No results for input(s): LIPASE, AMYLASE in the last 168 hours. No results for input(s): AMMONIA in the last 168 hours. Coagulation Profile: No results for  input(s): INR, PROTIME in the last 168 hours.  Cardiac Enzymes: No results for input(s): CKTOTAL, CKMB, CKMBINDEX, TROPONINI in the last 168 hours.  BNP (last 3 results) No results for input(s): PROBNP in the last 8760 hours. HbA1C: No results for input(s): HGBA1C in the last 72 hours. CBG: No results for input(s): GLUCAP in the last 168 hours. Lipid Profile: No results for input(s): CHOL, HDL, LDLCALC, TRIG, CHOLHDL, LDLDIRECT in the last 72 hours. Thyroid Function Tests: No results for input(s): TSH, T4TOTAL, FREET4, T3FREE, THYROIDAB in the last 72 hours. Anemia Panel: No results for input(s): VITAMINB12, FOLATE, FERRITIN, TIBC,  IRON, RETICCTPCT in the last 72 hours. Sepsis Labs: No results for input(s): PROCALCITON, LATICACIDVEN in the last 168 hours.   Recent Results (from the past 240 hour(s))  Resp Panel by RT-PCR (Flu A&B, Covid) Nasopharyngeal Swab     Status: None   Collection Time: 01/17/21  7:25 AM   Specimen: Nasopharyngeal Swab; Nasopharyngeal(NP) swabs in vial transport medium  Result Value Ref Range Status   SARS Coronavirus 2 by RT PCR NEGATIVE NEGATIVE Final    Comment: (NOTE) SARS-CoV-2 target nucleic acids are NOT DETECTED.  The SARS-CoV-2 RNA is generally detectable in upper respiratory specimens during the acute phase of infection. The lowest concentration of SARS-CoV-2 viral copies this assay can detect is 138 copies/mL. A negative result does not preclude SARS-Cov-2 infection and should not be used as the sole basis for treatment or other patient management decisions. A negative result may occur with  improper specimen collection/handling, submission of specimen other than nasopharyngeal swab, presence of viral mutation(s) within the areas targeted by this assay, and inadequate number of viral copies(<138 copies/mL). A negative result must be combined with clinical observations, patient history, and epidemiological information. The expected result is  Negative.  Fact Sheet for Patients:  BloggerCourse.com  Fact Sheet for Healthcare Providers:  SeriousBroker.it  This test is no t yet approved or cleared by the [place] FDA and  has been authorized for detection and/or diagnosis of SARS-CoV-2 by FDA under an Emergency Use Authorization (EUA). This EUA will remain  in effect (meaning this test can be used) for the duration of the COVID-19 declaration under Section 564(b)(1) of the Act, 21 U.S.C.section 360bbb-3(b)(1), unless the authorization is terminated  or revoked sooner.       Influenza A by PCR NEGATIVE NEGATIVE Final   Influenza B by PCR NEGATIVE NEGATIVE Final    Comment: (NOTE) The Xpert Xpress SARS-CoV-2/FLU/RSV plus assay is intended as an aid in the diagnosis of influenza from Nasopharyngeal swab specimens and should not be used as a sole basis for treatment. Nasal washings and aspirates are unacceptable for Xpert Xpress SARS-CoV-2/FLU/RSV testing.  Fact Sheet for Patients: BloggerCourse.com  Fact Sheet for Healthcare Providers: SeriousBroker.it  This test is not yet approved or cleared by the [place] FDA and has been authorized for detection and/or diagnosis of SARS-CoV-2 by FDA under an Emergency Use Authorization (EUA). This EUA will remain in effect (meaning this test can be used) for the duration of the COVID-19 declaration under Section 564(b)(1) of the Act, 21 U.S.C. section 360bbb-3(b)(1), unless the authorization is terminated or revoked.  Performed at [facility], [address] [place], [place] [address]   Culture, Urine     Status: Abnormal   Collection Time: 01/17/21  3:15 PM   Specimen: Urine, Random  Result Value Ref Range Status   Specimen Description URINE, RANDOM  Final   Special Requests   Final    NONE Performed at [facility], [address] [place],  [place] [address]    Culture (A)  Final    >=100,000 COLONIES/mL KLEBSIELLA PNEUMONIAE Confirmed Extended Spectrum Beta-Lactamase Producer (ESBL).  In bloodstream infections from ESBL organisms, carbapenems are preferred over piperacillin/tazobactam. They are shown to have a lower risk of mortality.    Report Status 01/20/2021 FINAL  Final   Organism ID, Bacteria KLEBSIELLA PNEUMONIAE (A)  Final      Susceptibility   Klebsiella pneumoniae - MIC*    AMPICILLIN >=32 RESISTANT Resistant  CEFAZOLIN >=64 RESISTANT Resistant     CEFEPIME >=32 RESISTANT Resistant     CEFTRIAXONE >=64 RESISTANT Resistant     CIPROFLOXACIN >=4 RESISTANT Resistant     GENTAMICIN <=1 SENSITIVE Sensitive     IMIPENEM 0.5 SENSITIVE Sensitive     NITROFURANTOIN 64 INTERMEDIATE Intermediate     TRIMETH/SULFA >=320 RESISTANT Resistant     AMPICILLIN/SULBACTAM >=32 RESISTANT Resistant     PIP/TAZO >=128 RESISTANT Resistant     * >=100,000 COLONIES/mL KLEBSIELLA PNEUMONIAE     Radiology Studies: No results found.  Scheduled Meds:  atorvastatin  10 mg Oral Daily   brimonidine  1 drop Both Eyes TID   dabigatran  150 mg Oral Q12H   diltiazem  30 mg Oral BID   docusate sodium  100 mg Oral BID   dorzolamide  1 drop Both Eyes BID   finasteride  5 mg Oral Daily   pantoprazole  40 mg Oral Daily   QUEtiapine  25 mg Oral QHS   Continuous Infusions:     LOS: 7 days   Darlin Priestly, MD [facility] Pager On Amion  If 7PM-7AM, please contact night-coverage 01/25/2021, 2:53 PM

## 2021-01-26 LAB — BASIC METABOLIC PANEL
Anion gap: 6 (ref 5–15)
BUN: 17 mg/dL (ref 8–23)
CO2: 27 mmol/L (ref 22–32)
Calcium: 9 mg/dL (ref 8.9–10.3)
Chloride: 103 mmol/L (ref 98–111)
Creatinine, Ser: 0.85 mg/dL (ref 0.61–1.24)
GFR, Estimated: >60 mL/min (ref >60)
Glucose, Bld: 91 mg/dL (ref 70–99)
Potassium: 3.6 mmol/L (ref 3.5–5.1)
Sodium: 136 mmol/L (ref 135–145)

## 2021-01-26 LAB — CBC
HCT: 32.9 % — ABNORMAL LOW (ref 39.0–52.0)
Hemoglobin: 11.1 g/dL — ABNORMAL LOW (ref 13.0–17.0)
MCH: 32.6 pg (ref 26.0–34.0)
MCHC: 33.7 g/dL (ref 30.0–36.0)
MCV: 96.5 fL (ref 80.0–100.0)
Platelets: 230 10*3/uL (ref 150–400)
RBC: 3.41 MIL/uL — ABNORMAL LOW (ref 4.22–5.81)
RDW: 15.2 % (ref 11.5–15.5)
WBC: 6.7 10*3/uL (ref 4.0–10.5)
nRBC: 0 % (ref 0.0–0.2)

## 2021-01-26 LAB — MAGNESIUM: Magnesium: 1.5 mg/dL — ABNORMAL LOW (ref 1.7–2.4)

## 2021-01-26 MED ORDER — MAGNESIUM SULFATE 4 GM/100ML IV SOLN
4.0000 g | Freq: Once | INTRAVENOUS | Status: AC
  Administered 2021-01-26: 4 g via INTRAVENOUS
  Filled 2021-01-26: qty 100

## 2021-01-26 NOTE — Plan of Care (Signed)
  Problem: Education: Goal: Knowledge of General Education information will improve Description: Including pain rating scale, medication(s)/side effects and non-pharmacologic comfort measures Outcome: Not Progressing   Problem: Health Behavior/Discharge Planning: Goal: Ability to manage health-related needs will improve Outcome: Not Progressing   Problem: Clinical Measurements: Goal: Ability to maintain clinical measurements within normal limits will improve Outcome: Not Progressing Goal: Will remain free from infection Outcome: Not Progressing Goal: Diagnostic test results will improve Outcome: Not Progressing Goal: Respiratory complications will improve Outcome: Not Progressing Goal: Cardiovascular complication will be avoided Outcome: Not Progressing   Problem: Activity: Goal: Risk for activity intolerance will decrease Outcome: Not Progressing   Problem: Nutrition: Goal: Adequate nutrition will be maintained Outcome: Not Progressing   Problem: Skin Integrity: Goal: Risk for impaired skin integrity will decrease Outcome: Not Progressing   Problem: Safety: Goal: Ability to remain free from injury will improve Outcome: Not Progressing

## 2021-01-26 NOTE — Progress Notes (Signed)
PROGRESS NOTE    Mark Moore  SAY:[id number] DOB: 04-02-1929 DOA: 01/17/2021 PCP: Wanda Plump, MD    Brief Narrative:  86 y.o. male with medical history significant of afib and HLD presenting with a fall.  His wife died in 04-27-2023 and he was hospitalized in January in [place] for FTT.  His family moved him to the [place] in April and he lived with his daughter for a few weeks prior to transitioning to [facility]. Pt was recently treated for UTI with bactrim and also started on Wellbutrin for depression. Pt noted to be increasingly confused over the last week but it acutely worsened 2 days prior to admit.   Assessment & Plan:   Principal Problem:   Fall (on)(from) sidewalk curb, initial encounter Active Problems:   Acute metabolic encephalopathy   Atrial fibrillation, chronic (HCC)   Dementia (HCC)   Depression   Dyslipidemia   Glaucoma   Thoracic compression fracture, closed, initial encounter (HCC)   DNR (do not resuscitate)   Acute encephalopathy  ESBL Klebsiella UTI, POA --started on ceftriaxone on presentation, then switched to meropenem, and then fosfomycin x2 doses, completed treatment.  Fall -Patient noted to have wandered away from his ALF and was found down outside the facility -Cleared from a trauma standpoint other than thoracic compression fracture (see below) --PT rec SNF rehab   Acute metabolic encephalopathy, POA, improved Likely hospital delirium -Patient presenting with encephalopathy as evidenced by his confusion -Pt is suspected to have underlying mild dementia PTA, although not yet diagnosed -may be contributed by UTI --agitated at night requiring intermittent IV haldol, then started on seroquel Plan: --cont seroquel 25 mg nightly  Possible Dementia -Not previously formally diagnosed, but has reported issues with sundowning while hospitalized in January  -Suspect underlying cognitive impairment which is being increasingly  unmasked --will return to ALF memory unit after rehab   Afib --rate controlled Plan: --cont cardizem --cont Pradaxa   Depression -Recently on wellbutrin and lexapro, held on admission --cont to hold both   HLD --cont lipitor   Glaucoma -Continue Alphagan, Trusopt   T-spine compression fracture -Neurosurgery consulted -Per Neurosurgery, no acute neurosurgical intervention indicated at this time, no brace needed, activity as tolerated without restrictions  Hypokalemia --replete with oral potassium PRN    DVT prophylaxis: dabigatran Code Status: DNR Family Communication:   Status is: Inpatient   Inpatient level of care appropriate due to severity of illness  Dispo: The patient is from: ALF              Anticipated d/c is to: SNF              Patient currently is medically stable to d/c.  Waiting on placement.    Difficult to place patient Yes pt's insurance was not accepted by most SNF  Consultants:  Neurosurgery  Procedures:    Antimicrobials: Anti-infectives (From admission, onward)    Start     Dose/Rate Route Frequency Ordered Stop   01/21/21 1700  meropenem (MERREM) 1 g in sodium chloride 0.9 % 100 mL IVPB  Status:  Discontinued        1 g 200 mL/hr over 30 Minutes Intravenous Every 8 hours 01/21/21 0937 01/21/21 1212   01/21/21 1300  fosfomycin (MONUROL) packet 3 g        3 g Oral every 72 hours 01/21/21 1212 01/24/21 2200   01/20/21 1130  meropenem (MERREM) 1 g in sodium chloride 0.9 % 100 mL IVPB  Status:  Discontinued        1 g 200 mL/hr over 30 Minutes Intravenous Every 12 hours 01/20/21 1036 01/21/21 0937   01/19/21 1315  cefTRIAXone (ROCEPHIN) 1 g in sodium chloride 0.9 % 100 mL IVPB  Status:  Discontinued        1 g 200 mL/hr over 30 Minutes Intravenous Every 24 hours 01/19/21 1223 01/20/21 1032       Subjective: RN reported pt doing well.  Normal oral intake, urination and BM.  PT/OT requested to work with pt as much as possible, as  currently no SNF bed offer due to insurance issues.     Objective: Vitals:   01/25/21 1700 01/25/21 2043 01/26/21 0405 01/26/21 1404  BP: (!) 135/95 124/77 127/78 103/72  Pulse: 96 85 90 89  Resp:  18 18 18   Temp: 98.2 F (36.8 C) 97.9 F (36.6 C) 97.9 F (36.6 C) 97.7 F (36.5 C)  TempSrc:  Oral Oral   SpO2: 97% 98% 98% 98%  Weight:      Height:        Intake/Output Summary (Last 24 hours) at 01/26/2021 1749 Last data filed at 01/25/2021 1800 Gross per 24 hour  Intake 120 ml  Output --  Net 120 ml   Filed Weights   01/17/21 0722  Weight: 81.6 kg    Examination: Constitutional: NAD CV: No cyanosis.   RESP: normal respiratory effort, on RA Extremities: No effusions, edema in BLE SKIN: warm, dry, thin skin and scabs over BLE   Data Reviewed: I have personally reviewed following labs and imaging studies  CBC: Recent Labs  Lab 01/22/21 0129 01/23/21 0230 01/24/21 0200 01/25/21 0223 01/26/21 0242  WBC 7.3 6.5 5.9 6.3 6.7  HGB 11.1* 11.3* 10.8* 11.4* 11.1*  HCT 32.7* 32.9* 32.6* 34.5* 32.9*  MCV 96.5 95.9 96.4 96.4 96.5  PLT 192 195 214 239 230   Basic Metabolic Panel: Recent Labs  Lab 01/22/21 0129 01/23/21 0230 01/24/21 0200 01/25/21 0223 01/26/21 0242  NA 138 140 140 136 136  K 3.1* 3.6 3.4* 3.4* 3.6  CL 103 107 106 102 103  CO2 28 28 29 27 27   GLUCOSE 99 95 84 77 91  BUN 16 14 12 15 17   CREATININE 0.99 0.88 0.79 0.85 0.85  CALCIUM 8.7* 8.7* 8.7* 9.0 9.0  MG 1.9 1.7 1.7 1.7 1.5*   GFR: Estimated Creatinine Clearance: 58.9 mL/min (by C-G formula based on SCr of 0.85 mg/dL). Liver Function Tests: Recent Labs  Lab 01/20/21 0137  AST 25  ALT 19  ALKPHOS 49  BILITOT 0.9  PROT 5.1*  ALBUMIN 2.8*   No results for input(s): LIPASE, AMYLASE in the last 168 hours. No results for input(s): AMMONIA in the last 168 hours. Coagulation Profile: No results for input(s): INR, PROTIME in the last 168 hours.  Cardiac Enzymes: No results for  input(s): CKTOTAL, CKMB, CKMBINDEX, TROPONINI in the last 168 hours.  BNP (last 3 results) No results for input(s): PROBNP in the last 8760 hours. HbA1C: No results for input(s): HGBA1C in the last 72 hours. CBG: No results for input(s): GLUCAP in the last 168 hours. Lipid Profile: No results for input(s): CHOL, HDL, LDLCALC, TRIG, CHOLHDL, LDLDIRECT in the last 72 hours. Thyroid Function Tests: No results for input(s): TSH, T4TOTAL, FREET4, T3FREE, THYROIDAB in the last 72 hours. Anemia Panel: No results for input(s): VITAMINB12, FOLATE, FERRITIN, TIBC, IRON, RETICCTPCT in the last 72 hours. Sepsis Labs: No results for input(s): PROCALCITON,  LATICACIDVEN in the last 168 hours.   Recent Results (from the past 240 hour(s))  Resp Panel by RT-PCR (Flu A&B, Covid) Nasopharyngeal Swab     Status: None   Collection Time: 01/17/21  7:25 AM   Specimen: Nasopharyngeal Swab; Nasopharyngeal(NP) swabs in vial transport medium  Result Value Ref Range Status   SARS Coronavirus 2 by RT PCR NEGATIVE NEGATIVE Final    Comment: (NOTE) SARS-CoV-2 target nucleic acids are NOT DETECTED.  The SARS-CoV-2 RNA is generally detectable in upper respiratory specimens during the acute phase of infection. The lowest concentration of SARS-CoV-2 viral copies this assay can detect is 138 copies/mL. A negative result does not preclude SARS-Cov-2 infection and should not be used as the sole basis for treatment or other patient management decisions. A negative result may occur with  improper specimen collection/handling, submission of specimen other than nasopharyngeal swab, presence of viral mutation(s) within the areas targeted by this assay, and inadequate number of viral copies(<138 copies/mL). A negative result must be combined with clinical observations, patient history, and epidemiological information. The expected result is Negative.  Fact Sheet for Patients:   BloggerCourse.com  Fact Sheet for Healthcare Providers:  SeriousBroker.it  This test is no t yet approved or cleared by the [place] FDA and  has been authorized for detection and/or diagnosis of SARS-CoV-2 by FDA under an Emergency Use Authorization (EUA). This EUA will remain  in effect (meaning this test can be used) for the duration of the COVID-19 declaration under Section 564(b)(1) of the Act, 21 U.S.C.section 360bbb-3(b)(1), unless the authorization is terminated  or revoked sooner.       Influenza A by PCR NEGATIVE NEGATIVE Final   Influenza B by PCR NEGATIVE NEGATIVE Final    Comment: (NOTE) The Xpert Xpress SARS-CoV-2/FLU/RSV plus assay is intended as an aid in the diagnosis of influenza from Nasopharyngeal swab specimens and should not be used as a sole basis for treatment. Nasal washings and aspirates are unacceptable for Xpert Xpress SARS-CoV-2/FLU/RSV testing.  Fact Sheet for Patients: BloggerCourse.com  Fact Sheet for Healthcare Providers: SeriousBroker.it  This test is not yet approved or cleared by the [place] FDA and has been authorized for detection and/or diagnosis of SARS-CoV-2 by FDA under an Emergency Use Authorization (EUA). This EUA will remain in effect (meaning this test can be used) for the duration of the COVID-19 declaration under Section 564(b)(1) of the Act, 21 U.S.C. section 360bbb-3(b)(1), unless the authorization is terminated or revoked.  Performed at [facility], [address], [place], [place] [address]   Culture, Urine     Status: Abnormal   Collection Time: 01/17/21  3:15 PM   Specimen: Urine, Random  Result Value Ref Range Status   Specimen Description URINE, RANDOM  Final   Special Requests   Final    NONE Performed at [facility], [address], [place], [place] [address]    Culture (A)  Final     >=100,000 COLONIES/mL KLEBSIELLA PNEUMONIAE Confirmed Extended Spectrum Beta-Lactamase Producer (ESBL).  In bloodstream infections from ESBL organisms, carbapenems are preferred over piperacillin/tazobactam. They are shown to have a lower risk of mortality.    Report Status 01/20/2021 FINAL  Final   Organism ID, Bacteria KLEBSIELLA PNEUMONIAE (A)  Final      Susceptibility   Klebsiella pneumoniae - MIC*    AMPICILLIN >=32 RESISTANT Resistant     CEFAZOLIN >=64 RESISTANT Resistant     CEFEPIME >=32 RESISTANT Resistant  CEFTRIAXONE >=64 RESISTANT Resistant     CIPROFLOXACIN >=4 RESISTANT Resistant     GENTAMICIN <=1 SENSITIVE Sensitive     IMIPENEM 0.5 SENSITIVE Sensitive     NITROFURANTOIN 64 INTERMEDIATE Intermediate     TRIMETH/SULFA >=320 RESISTANT Resistant     AMPICILLIN/SULBACTAM >=32 RESISTANT Resistant     PIP/TAZO >=128 RESISTANT Resistant     * >=100,000 COLONIES/mL KLEBSIELLA PNEUMONIAE     Radiology Studies: No results found.  Scheduled Meds:  atorvastatin  10 mg Oral Daily   brimonidine  1 drop Both Eyes TID   dabigatran  150 mg Oral Q12H   diltiazem  30 mg Oral BID   docusate sodium  100 mg Oral BID   dorzolamide  1 drop Both Eyes BID   finasteride  5 mg Oral Daily   pantoprazole  40 mg Oral Daily   QUEtiapine  25 mg Oral QHS   Continuous Infusions:     LOS: 8 days   Darlin Priestlyina Kitt Minardi, MD [facility] Pager On Amion  If 7PM-7AM, please contact night-coverage 01/26/2021, 5:49 PM

## 2021-01-26 NOTE — Care Management Important Message (Signed)
Important Message  Patient Details  Name: Traveon Louro MRN: [id number] Date of Birth: Apr 28, 1929   Medicare Important Message Given:  Yes - Important Message mailed due to current National Emergency   Verbal consent obtained due to current National Emergency  Relationship to patient: Child Contact Name: [name] Call Date: 01/26/21  Time: 1210 Phone: [phone] Outcome: Spoke with contact Important Message mailed to: Patient address on file   Orson Aloe 01/26/2021, 12:11 PM

## 2021-01-27 MED ORDER — MAGNESIUM SULFATE 4 GM/100ML IV SOLN
4.0000 g | Freq: Once | INTRAVENOUS | Status: AC
  Administered 2021-01-27: 4 g via INTRAVENOUS
  Filled 2021-01-27: qty 100

## 2021-01-27 NOTE — Progress Notes (Signed)
Occupational Therapy Treatment Patient Details Name: Mark Moore MRN: [id number] DOB: 1928-09-06 Today's Date: 01/27/2021    History of present illness 87 y.o. male presented 01/17/21 with a fall and confusion. T12 compression fx (neurosurgery rec no intervention, no brace needed), ?UTI vs side effects of new med;  PMH significant of afib and HLD, wife died in 2023/04/18 and hospitalized in January with FTT (in [place] family moved him to ALF in [place]; dementia   OT comments  Pt received in recliner with Graciella present interpreting session. Pt with improvements in activity tolerance able to stand at sink ~ 8 mins for ADLs with min guard assist. Pt required at least one UE supported during ADLs,noted to prefer to lean on sink during grooming tasks. Pt currently requires MIN A for functional mobility with RW. Cognitive impairments continues to persists but improved from previous session. Pt would continue to benefit from skilled occupational therapy while admitted and after d/c to address the below listed limitations in order to improve overall functional mobility and facilitate independence with BADL participation. DC plan remains appropriate, will follow acutely per POC.    Follow Up Recommendations  Supervision/Assistance - 24 hour;SNF    Equipment Recommendations  Other (comment) (TBD at next venue of care)    Recommendations for Other Services      Precautions / Restrictions Precautions Precautions: Fall Precaution Comments: back precautions for comfort Restrictions Weight Bearing Restrictions: No       Mobility Bed Mobility               General bed mobility comments: up in recliner    Transfers Overall transfer level: Needs assistance Equipment used: Rolling walker (2 wheeled) Transfers: Sit to/from Stand Sit to Stand: Min guard         General transfer comment: vc for hand placement. min guard for safety    Balance Overall balance assessment:  Needs assistance Sitting-balance support: No upper extremity supported;Feet supported Sitting balance-Leahy Scale: Fair Sitting balance - Comments: supervision for safety   Standing balance support: During functional activity;Single extremity supported Standing balance-Leahy Scale: Poor Standing balance comment: at least one UE supported during ADls, pt prefers to lean on sink during ADLs                           ADL either performed or assessed with clinical judgement   ADL Overall ADL's : Needs assistance/impaired     Grooming: Wash/dry face;Oral care;Standing;Min guard Grooming Details (indicate cue type and reason): standing at sink with min guard assist, able to stand ~ 8 mins, noted to prefer to lean on sink                 Toilet Transfer: Minimal assistance;RW;Ambulation Toilet Transfer Details (indicate cue type and reason): simulated via functional mobiilty in room         Functional mobility during ADLs: Minimal assistance;Rolling walker General ADL Comments: pt with improvements in cognition and activity tolerance, continued deficits persists in balance     Vision       Perception     Praxis      Cognition Arousal/Alertness: Awake/alert Behavior During Therapy: WFL for tasks assessed/performed Overall Cognitive Status: History of cognitive impairments - at baseline                                 General Comments: Graciella present  interpreting session. pt alert and very pleasant, when asking about location pt states 'I'm not sure" but then when answer provided pt reports "yes I knew that" pt able to state correct year but thinks its July. pt very appreciative of session, following all commands. noted mild safety concers related to remembering to turn off water at sink and pt noted to wash face with soap putting soap in pts eyes with no awareness        Exercises Exercises: General Lower Extremity;Other exercises General  Exercises - Lower Extremity Hip ABduction/ADduction: AROM;Both;10 reps;Standing Hip Flexion/Marching: Both;Standing;15 reps Mini-Sqauts: 10 reps;Standing   Shoulder Instructions       General Comments Graciella present during session interpreting    Pertinent Vitals/ Pain       Pain Assessment: No/denies pain Faces Pain Scale: No hurt  Home Living                                          Prior Functioning/Environment              Frequency  Min 2X/week        Progress Toward Goals  OT Goals(current goals can now be found in the care plan section)  Progress towards OT goals: Progressing toward goals  Acute Rehab OT Goals Patient Stated Goal: none stated Time For Goal Achievement: 02/01/21 Potential to Achieve Goals: Fair  Plan Discharge plan remains appropriate;Frequency remains appropriate    Co-evaluation                 AM-PAC OT "6 Clicks" Daily Activity     Outcome Measure   Help from another person eating meals?: None Help from another person taking care of personal grooming?: A Little Help from another person toileting, which includes using toliet, bedpan, or urinal?: A Lot Help from another person bathing (including washing, rinsing, drying)?: A Lot Help from another person to put on and taking off regular upper body clothing?: A Little Help from another person to put on and taking off regular lower body clothing?: A Lot 6 Click Score: 16    End of Session Equipment Utilized During Treatment: Gait belt;Rolling walker  OT Visit Diagnosis: Unsteadiness on feet (R26.81);Other abnormalities of gait and mobility (R26.89);Muscle weakness (generalized) (M62.81)   Activity Tolerance Patient tolerated treatment well   Patient Left in chair;with chair alarm set;with call bell/phone within reach   Nurse Communication Mobility status        Time: 4008-6761 OT Time Calculation (min): 23 min  Charges: OT General Charges $OT  Visit: 1 Visit OT Treatments $Self Care/Home Management : 23-37 mins  Lenor Derrick., COTA/L Acute Rehabilitation Services [phone]    Mark Moore 01/27/2021, 12:59 PM

## 2021-01-27 NOTE — Progress Notes (Signed)
PROGRESS NOTE    Mark Moore  ONG:[id number] DOB: [birth year] DOA: 01/17/2021 PCP: Wanda Plump, MD    Brief Narrative:  87 y.o. male with medical history significant of afib and HLD presenting with a fall.  His wife died in 04-12-23 and he was hospitalized in January in [place] for FTT.  His family moved him to the [place] in April and he lived with his daughter for a few weeks prior to transitioning to [facility]F. Pt was recently treated for UTI with bactrim and also started on Wellbutrin for depression. Pt noted to be increasingly confused over the last week but it acutely worsened 2 days prior to admit.   Assessment & Plan:   Principal Problem:   Fall (on)(from) sidewalk curb, initial encounter Active Problems:   Acute metabolic encephalopathy   Atrial fibrillation, chronic (HCC)   Dementia (HCC)   Depression   Dyslipidemia   Glaucoma   Thoracic compression fracture, closed, initial encounter (HCC)   DNR (do not resuscitate)   Acute encephalopathy  ESBL Klebsiella UTI, POA --started on ceftriaxone on presentation, then switched to meropenem, and then fosfomycin x2 doses, completed treatment.  Fall and weakness -Patient noted to have wandered away from his ALF and was found down outside the facility -Cleared from a trauma standpoint other than thoracic compression fracture (see below) --PT rec SNF rehab --PT and OT will see pt on alternate days to max frequency of visits while inpatient   Acute metabolic encephalopathy, POA, improved Likely hospital delirium -Patient presenting with encephalopathy as evidenced by his confusion -Pt is suspected to have underlying mild dementia PTA, although not yet diagnosed -may be contributed by UTI --agitated at night requiring intermittent IV haldol, then started on seroquel Plan: --cont seroquel 25 mg nightly  Possible Dementia -Not previously formally diagnosed, but has reported issues with sundowning while  hospitalized in January  -Suspect underlying cognitive impairment which is being increasingly unmasked --will return to ALF memory unit after rehab   Afib --rate controlled Plan: --cont cardizem --cont Pradaxa   Depression -Recently on wellbutrin and lexapro, held on admission --cont to hold both   HLD --cont lipitor   Glaucoma -Continue Alphagan, Trusopt   T-spine compression fracture -Neurosurgery consulted -Per Neurosurgery, no acute neurosurgical intervention indicated at this time, no brace needed, activity as tolerated without restrictions  Hypokalemia --replete with oral potassium PRN    DVT prophylaxis: dabigatran Code Status: DNR Family Communication:   Status is: Inpatient   Inpatient level of care appropriate due to severity of illness  Dispo: The patient is from: ALF              Anticipated d/c is to: SNF              Patient currently is medically stable to d/c.  Waiting on placement.    Difficult to place patient Yes pt's insurance was not accepted by most SNF  Consultants:  Neurosurgery  Procedures:    Antimicrobials: Anti-infectives (From admission, onward)    Start     Dose/Rate Route Frequency Ordered Stop   01/21/21 1700  meropenem (MERREM) 1 g in sodium chloride 0.9 % 100 mL IVPB  Status:  Discontinued        1 g 200 mL/hr over 30 Minutes Intravenous Every 8 hours 01/21/21 0937 01/21/21 1212   01/21/21 1300  fosfomycin (MONUROL) packet 3 g        3 g Oral every 72 hours 01/21/21 1212 01/24/21  2200   01/20/21 1130  meropenem (MERREM) 1 g in sodium chloride 0.9 % 100 mL IVPB  Status:  Discontinued        1 g 200 mL/hr over 30 Minutes Intravenous Every 12 hours 01/20/21 1036 01/21/21 0937   01/19/21 1315  cefTRIAXone (ROCEPHIN) 1 g in sodium chloride 0.9 % 100 mL IVPB  Status:  Discontinued        1 g 200 mL/hr over 30 Minutes Intravenous Every 24 hours 01/19/21 1223 01/20/21 1032       Subjective: No complaints.  Normal oral  intake, urination and BM.  Calm and cooperative currently.   Objective: Vitals:   01/26/21 0405 01/26/21 1404 01/26/21 1954 01/27/21 0300  BP: 127/78 103/72 (!) 138/98 102/67  Pulse: 90 89 (!) 104 92  Resp: 18 18 20 20   Temp: 97.9 F (36.6 C) 97.7 F (36.5 C) 98 F (36.7 C) 98 F (36.7 C)  TempSrc: Oral     SpO2: 98% 98% 91% 96%  Weight:      Height:       No intake or output data in the 24 hours ending 01/27/21 1335  Filed Weights   01/17/21 0722  Weight: 81.6 kg    Examination: Constitutional: NAD, alert, calm, sitting in recliner HEENT: conjunctivae and lids normal, EOMI CV: No cyanosis.   RESP: normal respiratory effort, on RA Extremities: mild edema in BLE and both feet SKIN: warm, dry, thin skin and scabs over BLE Neuro: II - XII grossly intact.     Data Reviewed: I have personally reviewed following labs and imaging studies  CBC: Recent Labs  Lab 01/22/21 0129 01/23/21 0230 01/24/21 0200 01/25/21 0223 01/26/21 0242  WBC 7.3 6.5 5.9 6.3 6.7  HGB 11.1* 11.3* 10.8* 11.4* 11.1*  HCT 32.7* 32.9* 32.6* 34.5* 32.9*  MCV 96.5 95.9 96.4 96.4 96.5  PLT 192 195 214 239 230   Basic Metabolic Panel: Recent Labs  Lab 01/22/21 0129 01/23/21 0230 01/24/21 0200 01/25/21 0223 01/26/21 0242  NA 138 140 140 136 136  K 3.1* 3.6 3.4* 3.4* 3.6  CL 103 107 106 102 103  CO2 28 28 29 27 27   GLUCOSE 99 95 84 77 91  BUN 16 14 12 15 17   CREATININE 0.99 0.88 0.79 0.85 0.85  CALCIUM 8.7* 8.7* 8.7* 9.0 9.0  MG 1.9 1.7 1.7 1.7 1.5*   GFR: Estimated Creatinine Clearance: 58.9 mL/min (by C-G formula based on SCr of 0.85 mg/dL). Liver Function Tests: No results for input(s): AST, ALT, ALKPHOS, BILITOT, PROT, ALBUMIN in the last 168 hours.  No results for input(s): LIPASE, AMYLASE in the last 168 hours. No results for input(s): AMMONIA in the last 168 hours. Coagulation Profile: No results for input(s): INR, PROTIME in the last 168 hours.  Cardiac Enzymes: No  results for input(s): CKTOTAL, CKMB, CKMBINDEX, TROPONINI in the last 168 hours.  BNP (last 3 results) No results for input(s): PROBNP in the last 8760 hours. HbA1C: No results for input(s): HGBA1C in the last 72 hours. CBG: No results for input(s): GLUCAP in the last 168 hours. Lipid Profile: No results for input(s): CHOL, HDL, LDLCALC, TRIG, CHOLHDL, LDLDIRECT in the last 72 hours. Thyroid Function Tests: No results for input(s): TSH, T4TOTAL, FREET4, T3FREE, THYROIDAB in the last 72 hours. Anemia Panel: No results for input(s): VITAMINB12, FOLATE, FERRITIN, TIBC, IRON, RETICCTPCT in the last 72 hours. Sepsis Labs: No results for input(s): PROCALCITON, LATICACIDVEN in the last 168 hours.   Recent  Results (from the past 240 hour(s))  Culture, Urine     Status: Abnormal   Collection Time: 01/17/21  3:15 PM   Specimen: Urine, Random  Result Value Ref Range Status   Specimen Description URINE, RANDOM  Final   Special Requests   Final    NONE Performed at [facility], [address] [place], [place] [address]    Culture (A)  Final    >=100,000 COLONIES/mL KLEBSIELLA PNEUMONIAE Confirmed Extended Spectrum Beta-Lactamase Producer (ESBL).  In bloodstream infections from ESBL organisms, carbapenems are preferred over piperacillin/tazobactam. They are shown to have a lower risk of mortality.    Report Status 01/20/2021 FINAL  Final   Organism ID, Bacteria KLEBSIELLA PNEUMONIAE (A)  Final      Susceptibility   Klebsiella pneumoniae - MIC*    AMPICILLIN >=32 RESISTANT Resistant     CEFAZOLIN >=64 RESISTANT Resistant     CEFEPIME >=32 RESISTANT Resistant     CEFTRIAXONE >=64 RESISTANT Resistant     CIPROFLOXACIN >=4 RESISTANT Resistant     GENTAMICIN <=1 SENSITIVE Sensitive     IMIPENEM 0.5 SENSITIVE Sensitive     NITROFURANTOIN 64 INTERMEDIATE Intermediate     TRIMETH/SULFA >=320 RESISTANT Resistant     AMPICILLIN/SULBACTAM >=32 RESISTANT Resistant     PIP/TAZO >=128  RESISTANT Resistant     * >=100,000 COLONIES/mL KLEBSIELLA PNEUMONIAE     Radiology Studies: No results found.  Scheduled Meds:  atorvastatin  10 mg Oral Daily   brimonidine  1 drop Both Eyes TID   dabigatran  150 mg Oral Q12H   diltiazem  30 mg Oral BID   docusate sodium  100 mg Oral BID   dorzolamide  1 drop Both Eyes BID   finasteride  5 mg Oral Daily   pantoprazole  40 mg Oral Daily   QUEtiapine  25 mg Oral QHS   Continuous Infusions:  magnesium sulfate bolus IVPB        LOS: 9 days   Darlin Priestly, MD [facility] Pager On Amion  If 7PM-7AM, please contact night-coverage 01/27/2021, 1:35 PM

## 2021-01-27 NOTE — Progress Notes (Signed)
Physical Therapy Treatment Patient Details Name: Mark Moore MRN: [id number] DOB: [birth year] Today's Date: 01/27/2021    History of Present Illness 87 y.o. male presented 01/17/21 with a fall and confusion. T12 compression fx (neurosurgery rec no intervention, no brace needed), ?UTI vs side effects of new med;  PMH significant of afib and HLD, wife died in 2023-04-12 and hospitalized in January with FTT (in [place] family moved him to ALF in [place]; dementia    PT Comments    Patient continues to improve, walking greater distances with max 100 ft with RW. Continues to need cues for safe use (especially proximity, especially during turns). Able to add standing LE exercises today with good tolerance from endurance standpoint. Had arranged use of interpreter, Ashby Dawes, however she was delayed and Josh, RN interpreted during session.    Follow Up Recommendations  SNF;Supervision/Assistance - 24 hour     Equipment Recommendations  None recommended by PT    Recommendations for Other Services       Precautions / Restrictions Precautions Precautions: Fall Precaution Comments: back precautions for comfort    Mobility  Bed Mobility               General bed mobility comments: up in recliner    Transfers Overall transfer level: Needs assistance Equipment used: Rolling walker (2 wheeled) Transfers: Sit to/from Stand Sit to Stand: Min guard         General transfer comment: vc for hand placement  Ambulation/Gait Ambulation/Gait assistance: Min guard Gait Distance (Feet): 40 Feet (seated rest, 30, 100) Assistive device: Rolling walker (2 wheeled) Gait Pattern/deviations: Step-through pattern;Narrow base of support Gait velocity: decr   General Gait Details: vc for safe use of RW (especially with turns, practicing both rt and lt turns and proximity to RW); vc for upright posture   Stairs             Wheelchair Mobility    Modified Rankin (Stroke  Patients Only)       Balance Overall balance assessment: Needs assistance Sitting-balance support: No upper extremity supported;Feet supported Sitting balance-Leahy Scale: Fair Sitting balance - Comments: supervision for safety   Standing balance support: Bilateral upper extremity supported Standing balance-Leahy Scale: Poor Standing balance comment: bil UE support via RW                            Cognition Arousal/Alertness: Awake/alert Behavior During Therapy: WFL for tasks assessed/performed Overall Cognitive Status: History of cognitive impairments - at baseline                                 General Comments: pt conversing with nurse in Spanish appropriately ("they won't let me walk by myself")      Exercises General Exercises - Lower Extremity Hip ABduction/ADduction: AROM;Both;10 reps;Standing Hip Flexion/Marching: Both;Standing;15 reps Mini-Sqauts: 10 reps;Standing    General Comments        Pertinent Vitals/Pain Faces Pain Scale: No hurt    Home Living                      Prior Function            PT Goals (current goals can now be found in the care plan section) Acute Rehab PT Goals Patient Stated Goal: wants to walk Time For Goal Achievement: 02/01/21 Potential to Achieve Goals: Good Progress towards  PT goals: Progressing toward goals    Frequency    Min 2X/week      PT Plan Current plan remains appropriate    Co-evaluation              AM-PAC PT "6 Clicks" Mobility   Outcome Measure  Help needed turning from your back to your side while in a flat bed without using bedrails?: A Little Help needed moving from lying on your back to sitting on the side of a flat bed without using bedrails?: A Little Help needed moving to and from a bed to a chair (including a wheelchair)?: A Little Help needed standing up from a chair using your arms (e.g., wheelchair or bedside chair)?: A Little Help needed to  walk in hospital room?: A Little Help needed climbing 3-5 steps with a railing? : A Lot 6 Click Score: 17    End of Session Equipment Utilized During Treatment: Gait belt Activity Tolerance: Patient tolerated treatment well Patient left: in chair;with call bell/phone within reach;with chair alarm set Nurse Communication: Mobility status PT Visit Diagnosis: Unsteadiness on feet (R26.81);History of falling (Z91.81)     Time: 7341-9379 PT Time Calculation (min) (ACUTE ONLY): 19 min  Charges:  $Gait Training: 8-22 mins                      Jerolyn Center, PT Pager [phone]    Zena Amos 01/27/2021, 12:11 PM

## 2021-01-27 NOTE — TOC Progression Note (Signed)
Transition of Care [facility]) - Progression Note    Patient Details  Name: Mark Moore MRN: [id number] Date of Birth: 1928/12/01  Transition of Care [facility]) CM/SW Contact  Kermit Balo, RN Phone Number: 01/27/2021, 9:10 AM  Clinical Narrative:    Cm called and updated the patients daughter, [name] about waiting on insurance to switch on the 1st in order to see if he will qualify for SNF rehab at [facility].  TOC following.  Expected Discharge Plan: Skilled Nursing Facility Barriers to Discharge: Continued Medical Work up  Expected Discharge Plan and Services Expected Discharge Plan: Skilled Nursing Facility In-house Referral: Clinical Social Work     Living arrangements for the past 2 months: Assisted Living Facility                                       Social Determinants of Health (SDOH) Interventions    Readmission Risk Interventions No flowsheet data found.

## 2021-01-28 DIAGNOSIS — G9340 Encephalopathy, unspecified: Secondary | ICD-10-CM

## 2021-01-28 NOTE — TOC Progression Note (Addendum)
Transition of Care [facility]) - Progression Note    Patient Details  Name: Dominion Kathan MRN: [id number] Date of Birth: 11/26/1928  Transition of Care [facility]) CM/SW Contact  Lockie Pares, RN Phone Number: 01/28/2021, 9:18 AM  Clinical Narrative:     No acceptances to SNF as of yet. This CM will be following for acceptances and speak with family/ patient Insurance does start tomorrow.  Expected Discharge Plan: Skilled Nursing Facility Barriers to Discharge: Continued Medical Work up  Expected Discharge Plan and Services Expected Discharge Plan: Skilled Nursing Facility In-house Referral: Clinical Social Work     Living arrangements for the past 2 months: Assisted Living Facility                                       Social Determinants of Health (SDOH) Interventions    Readmission Risk Interventions No flowsheet data found.

## 2021-01-28 NOTE — Progress Notes (Signed)
Physical Therapy Treatment Patient Details Name: Mark Moore MRN: [id number] DOB: 03-May-1929 Today's Date: 01/28/2021    History of Present Illness 87 y.o. male presented 01/17/21 with a fall and confusion. T12 compression fx (neurosurgery rec no intervention, no brace needed), ?UTI vs side effects of new med;  PMH significant of afib and HLD, wife died in 01-May-2023 and hospitalized in January with FTT (in [place] family moved him to ALF in [place]; dementia    PT Comments    Pt just awoke at time of session and was lethargic/fatigued but agreed to therapy.  Continues to need min guard- min A for balance and safety.  Continue POC with recommendation for SNF   Follow Up Recommendations  SNF;Supervision/Assistance - 24 hour     Equipment Recommendations  None recommended by PT    Recommendations for Other Services       Precautions / Restrictions Precautions Precautions: Fall Precaution Comments: back precautions for comfort    Mobility  Bed Mobility Overal bed mobility: Needs Assistance Bed Mobility: Rolling;Sidelying to Sit Rolling: Min assist Sidelying to sit: Min assist       General bed mobility comments: Partial log roll technique -attempted to cue for log roll but pt alreadymoving to sit    Transfers Overall transfer level: Needs assistance Equipment used: Rolling walker (2 wheeled) Transfers: Sit to/from Stand Sit to Stand: Min assist         General transfer comment: verbal cues for hand placement with min A to steady ; performed  x3; Pt had BM and urinated on BSC -required min A for ADLs  Ambulation/Gait Ambulation/Gait assistance: Min assist Gait Distance (Feet): 60 Feet Assistive device: Rolling walker (2 wheeled) Gait Pattern/deviations: Step-through pattern;Decreased stride length Gait velocity: decreased   General Gait Details: Pt not wanting to go in hall due to lethargy (just woke up).  Did ambulate 2 laps in room with min A for RW  andcues for posture   Stairs             Wheelchair Mobility    Modified Rankin (Stroke Patients Only)       Balance Overall balance assessment: Needs assistance Sitting-balance support: No upper extremity supported;Feet supported Sitting balance-Leahy Scale: Good     Standing balance support: Bilateral upper extremity supported;No upper extremity supported Standing balance-Leahy Scale: Fair Standing balance comment: RW to ambulate; did stand at sink with occasional single UE support while washing hands and face                            Cognition Arousal/Alertness: Awake/alert Behavior During Therapy: WFL for tasks assessed/performed Overall Cognitive Status: History of cognitive impairments - at baseline                                 General Comments: Pt following commands with increased times; responses are appropriate 75% of time; very pleasant and cooperative      Exercises      General Comments        Pertinent Vitals/Pain Pain Assessment: No/denies pain    Home Living                      Prior Function            PT Goals (current goals can now be found in the care plan section) Acute Rehab PT  Goals Patient Stated Goal: none stated PT Goal Formulation: With family Time For Goal Achievement: 02/01/21 Potential to Achieve Goals: Good Progress towards PT goals: Progressing toward goals    Frequency    Min 2X/week      PT Plan Current plan remains appropriate    Co-evaluation              AM-PAC PT "6 Clicks" Mobility   Outcome Measure  Help needed turning from your back to your side while in a flat bed without using bedrails?: A Little Help needed moving from lying on your back to sitting on the side of a flat bed without using bedrails?: A Little Help needed moving to and from a bed to a chair (including a wheelchair)?: A Little Help needed standing up from a chair using your arms (e.g.,  wheelchair or bedside chair)?: A Little Help needed to walk in hospital room?: A Little Help needed climbing 3-5 steps with a railing? : A Lot 6 Click Score: 17    End of Session Equipment Utilized During Treatment: Gait belt Activity Tolerance: Patient tolerated treatment well Patient left: in chair;with call bell/phone within reach;with chair alarm set Nurse Communication: Mobility status (notified tech pt urinated, small BM, and needs new sheets) PT Visit Diagnosis: Unsteadiness on feet (R26.81);History of falling (Z91.81)     Time: 9767-3419 PT Time Calculation (min) (ACUTE ONLY): 34 min  Charges:  $Gait Training: 8-22 mins $Therapeutic Activity: 8-22 mins                     Anise Salvo, PT Acute Rehab Services Pager [phone] [facility] [phone]    Rayetta Humphrey 01/28/2021, 3:34 PM

## 2021-01-28 NOTE — Progress Notes (Signed)
  Speech Language Pathology Treatment: Cognitive-Linquistic  Patient Details Name: Rashod Gougeon MRN: [id number] DOB: [birth year] Today's Date: 01/28/2021 Time: 1100-1140 SLP Time Calculation (min) (ACUTE ONLY): 40 min  Assessment / Plan / Recommendation Clinical Impression  Pt much improved from session last week. Able to clearly express wants and needs, verbalized orientation to place and situation. Provided a coherent story about his wife's passing, moving to [place] with his daughter. He is still somewhat anxious about the future and what his more permanent situation will be. Hospital delirium has improved. Pt surely still has some mild to moderate dementia, but appears more capable of functioning with physical but not constant cognitive assistance. Will sign off acutely at this time as needs are met.   HPI HPI: Prabhjot Piscitello is a 87 y.o. male with medical history significant of afib and HLD presenting with a fall.  His wife died in 05/18/2023 and he was hospitalized in January in [place] for FTT.  His family moved him to the [place] in April and he lived with his daughter for a few weeks prior to transitioning to [facility] (not memory care).  He has had some memory issues but not clearly dementia.  His has been more confusion, hallucinations than actual memory issues.  This was worsening and so last week the facility tested his urine (no urinary symptoms, just the confusion) and diagnosed a UTI; he was started on Bactrim.  He also started taking Wellbutrin about 9 days ago due to depression/prolonged grief.  He has been increasingly confused over the last week but it acutely worsened 2 days ago.  Overnight, he was found outside his facility, half-dressed and having fallen.  In the ER, he has been confused with non-sensical speech.      SLP Plan  All goals met       Recommendations                   Oral Care Recommendations: Oral care BID Follow up Recommendations:  Skilled Nursing facility SLP Visit Diagnosis: Cognitive communication deficit (W09.811) Plan: All goals met       GO               Herbie Baltimore, MA CCC-SLP  Acute Rehabilitation Services Pager [phone] Office [phone]  Lynann Beaver 01/28/2021, 1:23 PM

## 2021-01-28 NOTE — Progress Notes (Signed)
PROGRESS NOTE    Mark Moore  BZJ:[id number] DOB: Jan 30, 1929 DOA: 01/17/2021 PCP: Wanda Plump, MD    Brief Narrative:  87 y.o. male with medical history significant of afib and HLD presenting with a fall.  His wife died in 2023-05-17 and he was hospitalized in January in [place] for FTT.  His family moved him to the [place] in April and he lived with his daughter for a few weeks prior to transitioning to [facility]. Pt was recently treated for UTI with bactrim and also started on Wellbutrin for depression. Pt noted to be increasingly confused over the last week but it acutely worsened 2 days prior to admit.   Assessment & Plan:   Principal Problem:   Fall (on)(from) sidewalk curb, initial encounter Active Problems:   Acute metabolic encephalopathy   Atrial fibrillation, chronic (HCC)   Dementia (HCC)   Depression   Dyslipidemia   Glaucoma   Thoracic compression fracture, closed, initial encounter (HCC)   DNR (do not resuscitate)   Acute encephalopathy  ESBL Klebsiella UTI, POA --started on ceftriaxone on presentation, then switched to meropenem, and then fosfomycin x2 doses, completed treatment.  Fall and weakness -Patient noted to have wandered away from his ALF and was found down outside the facility -Cleared from a trauma standpoint other than thoracic compression fracture (see below) --PT rec SNF rehab --PT and OT will see pt on alternate days to max frequency of visits while inpatient   Acute metabolic encephalopathy, POA, improved Likely hospital delirium -Patient presenting with encephalopathy as evidenced by his confusion -Pt is suspected to have underlying mild dementia PTA, although not yet diagnosed -may be contributed by UTI --agitated at night requiring intermittent IV haldol, then started on seroquel Plan: --cont seroquel 25 mg nightly  Possible Dementia -Not previously formally diagnosed, but has reported issues with sundowning while  hospitalized in January  -Suspect underlying cognitive impairment which is being increasingly unmasked --will return to ALF memory unit after rehab   Afib --rate controlled Plan: --cont cardizem --cont Pradaxa   Depression -Recently on wellbutrin and lexapro, held on admission --cont to hold both   HLD --cont lipitor   Glaucoma -Continue Alphagan, Trusopt   T-spine compression fracture -Neurosurgery consulted -Per Neurosurgery, no acute neurosurgical intervention indicated at this time, no brace needed, activity as tolerated without restrictions  Hypokalemia --replete with oral potassium PRN    DVT prophylaxis: dabigatran Code Status: DNR Family Communication: none at bedside  Status is: Inpatient   Inpatient level of care appropriate due to severity of illness  Dispo: The patient is from: ALF              Anticipated d/c is to: SNF              Patient currently is medically stable to d/c.  Waiting on placement.    Difficult to place patient Yes pt's insurance was not accepted by most SNF  Consultants:  Neurosurgery  Procedures:    Antimicrobials: Anti-infectives (From admission, onward)    Start     Dose/Rate Route Frequency Ordered Stop   01/21/21 1700  meropenem (MERREM) 1 g in sodium chloride 0.9 % 100 mL IVPB  Status:  Discontinued        1 g 200 mL/hr over 30 Minutes Intravenous Every 8 hours 01/21/21 0937 01/21/21 1212   01/21/21 1300  fosfomycin (MONUROL) packet 3 g        3 g Oral every 72 hours 01/21/21  1212 01/24/21 2200   01/20/21 1130  meropenem (MERREM) 1 g in sodium chloride 0.9 % 100 mL IVPB  Status:  Discontinued        1 g 200 mL/hr over 30 Minutes Intravenous Every 12 hours 01/20/21 1036 01/21/21 0937   01/19/21 1315  cefTRIAXone (ROCEPHIN) 1 g in sodium chloride 0.9 % 100 mL IVPB  Status:  Discontinued        1 g 200 mL/hr over 30 Minutes Intravenous Every 24 hours 01/19/21 1223 01/20/21 1032       Subjective: No complaints, no  significant events as discussed with staff.   Objective: Vitals:   01/27/21 1418 01/27/21 2032 01/28/21 0423 01/28/21 0919  BP: 111/81 (!) 100/58 116/71 121/75  Pulse: 85 72 76   Resp:      Temp: 97.8 F (36.6 C) 97.9 F (36.6 C) 98.8 F (37.1 C)   TempSrc: Oral     SpO2: 99% 96% 96%   Weight:      Height:       No intake or output data in the 24 hours ending 01/28/21 1200  Filed Weights   01/17/21 0722  Weight: 81.6 kg    Examination: Constitutional: NAD, alert, calm, sitting in recliner HEENT: conjunctivae and lids normal, EOMI CV: No cyanosis.   RESP: normal respiratory effort, on RA Extremities: mild edema in BLE and both feet SKIN: warm, dry, thin skin and scabs over BLE Neuro: II - XII grossly intact.     Data Reviewed: I have personally reviewed following labs and imaging studies  CBC: Recent Labs  Lab 01/22/21 0129 01/23/21 0230 01/24/21 0200 01/25/21 0223 01/26/21 0242  WBC 7.3 6.5 5.9 6.3 6.7  HGB 11.1* 11.3* 10.8* 11.4* 11.1*  HCT 32.7* 32.9* 32.6* 34.5* 32.9*  MCV 96.5 95.9 96.4 96.4 96.5  PLT 192 195 214 239 230   Basic Metabolic Panel: Recent Labs  Lab 01/22/21 0129 01/23/21 0230 01/24/21 0200 01/25/21 0223 01/26/21 0242  NA 138 140 140 136 136  K 3.1* 3.6 3.4* 3.4* 3.6  CL 103 107 106 102 103  CO2 28 28 29 27 27   GLUCOSE 99 95 84 77 91  BUN 16 14 12 15 17   CREATININE 0.99 0.88 0.79 0.85 0.85  CALCIUM 8.7* 8.7* 8.7* 9.0 9.0  MG 1.9 1.7 1.7 1.7 1.5*   GFR: Estimated Creatinine Clearance: 58.9 mL/min (by C-G formula based on SCr of 0.85 mg/dL). Liver Function Tests: No results for input(s): AST, ALT, ALKPHOS, BILITOT, PROT, ALBUMIN in the last 168 hours.  No results for input(s): LIPASE, AMYLASE in the last 168 hours. No results for input(s): AMMONIA in the last 168 hours. Coagulation Profile: No results for input(s): INR, PROTIME in the last 168 hours.  Cardiac Enzymes: No results for input(s): CKTOTAL, CKMB, CKMBINDEX,  TROPONINI in the last 168 hours.  BNP (last 3 results) No results for input(s): PROBNP in the last 8760 hours. HbA1C: No results for input(s): HGBA1C in the last 72 hours. CBG: No results for input(s): GLUCAP in the last 168 hours. Lipid Profile: No results for input(s): CHOL, HDL, LDLCALC, TRIG, CHOLHDL, LDLDIRECT in the last 72 hours. Thyroid Function Tests: No results for input(s): TSH, T4TOTAL, FREET4, T3FREE, THYROIDAB in the last 72 hours. Anemia Panel: No results for input(s): VITAMINB12, FOLATE, FERRITIN, TIBC, IRON, RETICCTPCT in the last 72 hours. Sepsis Labs: No results for input(s): PROCALCITON, LATICACIDVEN in the last 168 hours.   No results found for this or any previous  visit (from the past 240 hour(s)).    Radiology Studies: No results found.  Scheduled Meds:  atorvastatin  10 mg Oral Daily   brimonidine  1 drop Both Eyes TID   dabigatran  150 mg Oral Q12H   diltiazem  30 mg Oral BID   docusate sodium  100 mg Oral BID   dorzolamide  1 drop Both Eyes BID   finasteride  5 mg Oral Daily   pantoprazole  40 mg Oral Daily   QUEtiapine  25 mg Oral QHS   Continuous Infusions:      LOS: 10 days   Huey Bienenstock, MD [facility] Pager On Amion  If 7PM-7AM, please contact night-coverage 01/28/2021, 12:00 PM

## 2021-01-29 LAB — CBC
HCT: 29.8 % — ABNORMAL LOW (ref 39.0–52.0)
Hemoglobin: 10 g/dL — ABNORMAL LOW (ref 13.0–17.0)
MCH: 32.3 pg (ref 26.0–34.0)
MCHC: 33.6 g/dL (ref 30.0–36.0)
MCV: 96.1 fL (ref 80.0–100.0)
Platelets: 264 10*3/uL (ref 150–400)
RBC: 3.1 MIL/uL — ABNORMAL LOW (ref 4.22–5.81)
RDW: 15.4 % (ref 11.5–15.5)
WBC: 5 10*3/uL (ref 4.0–10.5)
nRBC: 0 % (ref 0.0–0.2)

## 2021-01-29 LAB — BASIC METABOLIC PANEL
Anion gap: 5 (ref 5–15)
BUN: 20 mg/dL (ref 8–23)
CO2: 28 mmol/L (ref 22–32)
Calcium: 8.6 mg/dL — ABNORMAL LOW (ref 8.9–10.3)
Chloride: 104 mmol/L (ref 98–111)
Creatinine, Ser: 0.93 mg/dL (ref 0.61–1.24)
GFR, Estimated: >60 mL/min (ref >60)
Glucose, Bld: 89 mg/dL (ref 70–99)
Potassium: 3.6 mmol/L (ref 3.5–5.1)
Sodium: 137 mmol/L (ref 135–145)

## 2021-01-29 LAB — MAGNESIUM: Magnesium: 2 mg/dL (ref 1.7–2.4)

## 2021-01-29 LAB — PHOSPHORUS: Phosphorus: 2 mg/dL — ABNORMAL LOW (ref 2.5–4.6)

## 2021-01-29 NOTE — TOC Progression Note (Addendum)
Transition of Care [facility]) - Progression Note    Patient Details  Name: Rishab Stoudt MRN: [id number] Date of Birth: [birth year]  [facility] of [facility]) CM/SW Contact  Lorri Frederick, LCSW Phone Number: 01/29/2021, 3:42 PM  Clinical Narrative:  Pt [employer] Medicare member #[id number].  Pt name on insurance card reads: "[employer]" SNF auth request submitted through [employer] portal.      Expected Discharge Plan: Skilled Nursing Facility Barriers to Discharge: Continued Medical Work up  Expected Discharge Plan and Services Expected Discharge Plan: Skilled Nursing Facility In-house Referral: Clinical Social Work     Living arrangements for the past 2 months: Assisted Living Facility                                       Social Determinants of Health (SDOH) Interventions    Readmission Risk Interventions No flowsheet data found.

## 2021-01-29 NOTE — Progress Notes (Signed)
Occupational Therapy Treatment Patient Details Name: Mark Moore MRN: [id number] DOB: May 21, 1929 Today's Date: 01/29/2021    History of present illness 87 y.o. male presented 01/17/21 with a fall and confusion. T12 compression fx (neurosurgery rec no intervention, no brace needed), ?UTI vs side effects of new med;  PMH significant of afib and HLD, wife died in 09-May-2023 and hospitalized in January with FTT (in [place] family moved him to ALF in [place]; dementia   OT comments  Pt making steady progress towards OT goals this session. Pt received supine in bed asleep, but easily able to arouse and agreeable to OT intervention. Pt currently requires MIN A for household distance functional mobility,min guard for standing grooming tasks and MIN A for toileting transfer. Pt continues to present with generalized deconditioning, impaired activity tolerance, and impaired balance. Pt would continue to benefit from skilled occupational therapy while admitted and after d/c to address the below listed limitations in order to improve overall functional mobility and facilitate independence with BADL participation. DC plan remains appropriate, will follow acutely per POC.     Follow Up Recommendations  Supervision/Assistance - 24 hour;SNF    Equipment Recommendations  Other (comment) (TBD at next venue of care)    Recommendations for Other Services      Precautions / Restrictions Precautions Precautions: Fall Precaution Comments: back precautions for comfort Restrictions Weight Bearing Restrictions: No       Mobility Bed Mobility Overal bed mobility: Needs Assistance Bed Mobility: Rolling;Sidelying to Sit Rolling: Min guard Sidelying to sit: Min guard;HOB elevated       General bed mobility comments: min guard for safety and cues for sequencing technique    Transfers Overall transfer level: Needs assistance Equipment used: Rolling walker (2 wheeled) Transfers: Sit to/from  Stand Sit to Stand: Min guard         General transfer comment: min guard to rise from EOB and toilet seat, cues for hand placment on grab bars when standing from toilet    Balance Overall balance assessment: Needs assistance Sitting-balance support: No upper extremity supported;Feet supported Sitting balance-Leahy Scale: Good     Standing balance support: No upper extremity supported;During functional activity Standing balance-Leahy Scale: Fair Standing balance comment: able dynamically reach at sink to retrieve needed ADL items with no UE support and close min guard                           ADL either performed or assessed with clinical judgement   ADL Overall ADL's : Needs assistance/impaired     Grooming: Wash/dry hands;Standing;Min guard Grooming Details (indicate cue type and reason): min guard for safety, no cues needed for safety this session with pt not having to lean on sink as much this session     Lower Body Bathing: Supervison/ safety;Set up;Sitting/lateral leans Lower Body Bathing Details (indicate cue type and reason): simulated via anterior pericare Upper Body Dressing : Minimal assistance;Sitting Upper Body Dressing Details (indicate cue type and reason): to don posterior gown     Toilet Transfer: Minimal assistance;RW;Ambulation;Regular Toilet;Grab bars Toilet Transfer Details (indicate cue type and reason): MIN A for balance and steadying assist to ambulate into BR Toileting- Clothing Manipulation and Hygiene: Supervision/safety;Set up;Sitting/lateral lean Toileting - Clothing Manipulation Details (indicate cue type and reason): anterior pericare from toilet, set up of toilet paper as pt with difficulty problem solving how to retrieve toilet paper from dispenser     Functional mobility during ADLs:  Minimal assistance;Rolling walker General ADL Comments: pt with more deficits related to problem solving this date. pt continues to present with  impaired activity tolerance, generalized deconditioning and impaired balance     Vision       Perception     Praxis      Cognition Arousal/Alertness: Awake/alert Behavior During Therapy: WFL for tasks assessed/performed Overall Cognitive Status: History of cognitive impairments - at baseline                                 General Comments: Graciella present interpreting during session, pt needing increased cues to following commands this session with pt interpreter having to repeat questions multiple times. pt perseverating on wanting to make sure he gets his hamburger soon, pt seemed more flat this session more "down" than previous session, stating multiple times that he is just so weak and tired        Exercises     Shoulder Instructions       General Comments Graciella present during session interpreting    Pertinent Vitals/ Pain       Pain Assessment: Faces Faces Pain Scale: No hurt  Home Living                                          Prior Functioning/Environment              Frequency  Min 2X/week        Progress Toward Goals  OT Goals(current goals can now be found in the care plan section)  Progress towards OT goals: Progressing toward goals  Acute Rehab OT Goals Patient Stated Goal: to eat a hamburger OT Goal Formulation: With patient Time For Goal Achievement: 02/01/21 Potential to Achieve Goals: Fair  Plan Discharge plan remains appropriate;Frequency remains appropriate    Co-evaluation                 AM-PAC OT "6 Clicks" Daily Activity     Outcome Measure   Help from another person eating meals?: None Help from another person taking care of personal grooming?: A Little Help from another person toileting, which includes using toliet, bedpan, or urinal?: A Little Help from another person bathing (including washing, rinsing, drying)?: A Lot Help from another person to put on and taking off  regular upper body clothing?: A Little Help from another person to put on and taking off regular lower body clothing?: A Lot 6 Click Score: 17    End of Session Equipment Utilized During Treatment: Gait belt;Rolling walker  OT Visit Diagnosis: Unsteadiness on feet (R26.81);Other abnormalities of gait and mobility (R26.89);Muscle weakness (generalized) (M62.81)   Activity Tolerance Patient tolerated treatment well   Patient Left in chair;with call bell/phone within reach;with chair alarm set   Nurse Communication Mobility status;Other (comment) (in chair, make sure pt gets a hamburger)        Time: 2542-7062 OT Time Calculation (min): 35 min  Charges: OT General Charges $OT Visit: 1 Visit OT Treatments $Self Care/Home Management : 8-22 mins $Therapeutic Activity: 8-22 mins  Lenor Derrick., COTA/L Acute Rehabilitation Services [phone]    Barron Schmid 01/29/2021, 4:15 PM

## 2021-01-29 NOTE — TOC Transition Note (Addendum)
Transition of Care [facility]) - CM/SW Discharge Note   Patient Details  Name: Mark Moore MRN: [id number] Date of Birth: July 05, 1929  [facility] CM/SW Contact:  Lockie Pares, RN Phone Number: 01/29/2021, 10:00 AM   Clinical Narrative:    Reached out to [facility] for SNF. They have insurance information per CSW note. Left confidential voice message to return call to discuss [facility] called back and is reviewing the patient for admission     Barriers to Discharge: Continued Medical Work up   Patient Goals and CMS Choice        Discharge Placement                       Discharge Plan and Services In-house Referral: Clinical Social Work                                   Social Determinants of Health (SDOH) Interventions     Readmission Risk Interventions No flowsheet data found.

## 2021-01-29 NOTE — Care Management Important Message (Signed)
Important Message  Patient Details  Name: Mark Moore MRN: [id number] Date of Birth: 1928-08-09   Medicare Important Message Given:  Yes - Important Message mailed due to current National Emergency  Verbal consent obtained due to current National Emergency  Relationship to patient: Child Contact Name: [name] Call Date: 01/29/21  Time: 1036 Phone: [phone] Outcome: Spoke with contact Important Message mailed to: Patient address on file   Orson Aloe 01/29/2021, 10:36 AM

## 2021-01-29 NOTE — Plan of Care (Signed)
°  Problem: Clinical Measurements: °Goal: Will remain free from infection °Outcome: Progressing °Goal: Respiratory complications will improve °Outcome: Progressing °  °

## 2021-01-29 NOTE — Progress Notes (Signed)
PROGRESS NOTE    Mark Moore  BZJ:[id number] DOB: Jan 30, 1929 DOA: 01/17/2021 PCP: Wanda Plump, MD    Brief Narrative:  87 y.o. male with medical history significant of afib and HLD presenting with a fall.  His wife died in 2023-05-17 and he was hospitalized in January in [place] for FTT.  His family moved him to the [place] in April and he lived with his daughter for a few weeks prior to transitioning to [facility]. Pt was recently treated for UTI with bactrim and also started on Wellbutrin for depression. Pt noted to be increasingly confused over the last week but it acutely worsened 2 days prior to admit.   Assessment & Plan:   Principal Problem:   Fall (on)(from) sidewalk curb, initial encounter Active Problems:   Acute metabolic encephalopathy   Atrial fibrillation, chronic (HCC)   Dementia (HCC)   Depression   Dyslipidemia   Glaucoma   Thoracic compression fracture, closed, initial encounter (HCC)   DNR (do not resuscitate)   Acute encephalopathy  ESBL Klebsiella UTI, POA --started on ceftriaxone on presentation, then switched to meropenem, and then fosfomycin x2 doses, completed treatment.  Fall and weakness -Patient noted to have wandered away from his ALF and was found down outside the facility -Cleared from a trauma standpoint other than thoracic compression fracture (see below) --PT rec SNF rehab --PT and OT will see pt on alternate days to max frequency of visits while inpatient   Acute metabolic encephalopathy, POA, improved Likely hospital delirium -Patient presenting with encephalopathy as evidenced by his confusion -Pt is suspected to have underlying mild dementia PTA, although not yet diagnosed -may be contributed by UTI --agitated at night requiring intermittent IV haldol, then started on seroquel Plan: --cont seroquel 25 mg nightly  Possible Dementia -Not previously formally diagnosed, but has reported issues with sundowning while  hospitalized in January  -Suspect underlying cognitive impairment which is being increasingly unmasked --will return to ALF memory unit after rehab   Afib --rate controlled Plan: --cont cardizem --cont Pradaxa   Depression -Recently on wellbutrin and lexapro, held on admission --cont to hold both   HLD --cont lipitor   Glaucoma -Continue Alphagan, Trusopt   T-spine compression fracture -Neurosurgery consulted -Per Neurosurgery, no acute neurosurgical intervention indicated at this time, no brace needed, activity as tolerated without restrictions  Hypokalemia --replete with oral potassium PRN    DVT prophylaxis: dabigatran Code Status: DNR Family Communication: none at bedside  Status is: Inpatient   Inpatient level of care appropriate due to severity of illness  Dispo: The patient is from: ALF              Anticipated d/c is to: SNF              Patient currently is medically stable to d/c.  Waiting on placement.    Difficult to place patient Yes pt's insurance was not accepted by most SNF  Consultants:  Neurosurgery  Procedures:    Antimicrobials: Anti-infectives (From admission, onward)    Start     Dose/Rate Route Frequency Ordered Stop   01/21/21 1700  meropenem (MERREM) 1 g in sodium chloride 0.9 % 100 mL IVPB  Status:  Discontinued        1 g 200 mL/hr over 30 Minutes Intravenous Every 8 hours 01/21/21 0937 01/21/21 1212   01/21/21 1300  fosfomycin (MONUROL) packet 3 g        3 g Oral every 72 hours 01/21/21  1212 01/24/21 2200   01/20/21 1130  meropenem (MERREM) 1 g in sodium chloride 0.9 % 100 mL IVPB  Status:  Discontinued        1 g 200 mL/hr over 30 Minutes Intravenous Every 12 hours 01/20/21 1036 01/21/21 0937   01/19/21 1315  cefTRIAXone (ROCEPHIN) 1 g in sodium chloride 0.9 % 100 mL IVPB  Status:  Discontinued        1 g 200 mL/hr over 30 Minutes Intravenous Every 24 hours 01/19/21 1223 01/20/21 1032       Subjective: No complaints, no  significant events as discussed with staff.  Patient with good appetite as discussed with staff   Objective: Vitals:   01/28/21 1343 01/28/21 2153 01/29/21 0641 01/29/21 1317  BP: (!) 96/53 118/62 (!) 98/57 (!) 103/58  Pulse: 65 82 80 81  Resp: 16 16 14    Temp: 98.9 F (37.2 C) 98.3 F (36.8 C) 98.8 F (37.1 C) 98.2 F (36.8 C)  TempSrc:      SpO2:  94% 98% 94%  Weight:      Height:        Intake/Output Summary (Last 24 hours) at 01/29/2021 1405 Last data filed at 01/29/2021 0434 Gross per 24 hour  Intake --  Output 750 ml  Net -750 ml    Filed Weights   01/17/21 0722  Weight: 81.6 kg    Examination:  Awake Alert, frail, deconditioned Symmetrical Chest wall movement, Good air movement bilaterally, CTAB RRR,No Gallops,Rubs or new Murmurs, No Parasternal Heave +ve B.Sounds, Abd Soft, No tenderness, No rebound - guarding or rigidity. No Cyanosis, Clubbing or edema, No new Rash or bruise     Data Reviewed: I have personally reviewed following labs and imaging studies  CBC: Recent Labs  Lab 01/23/21 0230 01/24/21 0200 01/25/21 0223 01/26/21 0242 01/29/21 0225  WBC 6.5 5.9 6.3 6.7 5.0  HGB 11.3* 10.8* 11.4* 11.1* 10.0*  HCT 32.9* 32.6* 34.5* 32.9* 29.8*  MCV 95.9 96.4 96.4 96.5 96.1  PLT 195 214 239 230 264   Basic Metabolic Panel: Recent Labs  Lab 01/23/21 0230 01/24/21 0200 01/25/21 0223 01/26/21 0242 01/29/21 0225  NA 140 140 136 136 137  K 3.6 3.4* 3.4* 3.6 3.6  CL 107 106 102 103 104  CO2 28 29 27 27 28   GLUCOSE 95 84 77 91 89  BUN 14 12 15 17 20   CREATININE 0.88 0.79 0.85 0.85 0.93  CALCIUM 8.7* 8.7* 9.0 9.0 8.6*  MG 1.7 1.7 1.7 1.5* 2.0  PHOS  --   --   --   --  2.0*   GFR: Estimated Creatinine Clearance: 53.8 mL/min (by C-G formula based on SCr of 0.93 mg/dL). Liver Function Tests: No results for input(s): AST, ALT, ALKPHOS, BILITOT, PROT, ALBUMIN in the last 168 hours.  No results for input(s): LIPASE, AMYLASE in the last 168 hours. No  results for input(s): AMMONIA in the last 168 hours. Coagulation Profile: No results for input(s): INR, PROTIME in the last 168 hours.  Cardiac Enzymes: No results for input(s): CKTOTAL, CKMB, CKMBINDEX, TROPONINI in the last 168 hours.  BNP (last 3 results) No results for input(s): PROBNP in the last 8760 hours. HbA1C: No results for input(s): HGBA1C in the last 72 hours. CBG: No results for input(s): GLUCAP in the last 168 hours. Lipid Profile: No results for input(s): CHOL, HDL, LDLCALC, TRIG, CHOLHDL, LDLDIRECT in the last 72 hours. Thyroid Function Tests: No results for input(s): TSH, T4TOTAL, FREET4, T3FREE,  THYROIDAB in the last 72 hours. Anemia Panel: No results for input(s): VITAMINB12, FOLATE, FERRITIN, TIBC, IRON, RETICCTPCT in the last 72 hours. Sepsis Labs: No results for input(s): PROCALCITON, LATICACIDVEN in the last 168 hours.   No results found for this or any previous visit (from the past 240 hour(s)).    Radiology Studies: No results found.  Scheduled Meds:  atorvastatin  10 mg Oral Daily   brimonidine  1 drop Both Eyes TID   dabigatran  150 mg Oral Q12H   diltiazem  30 mg Oral BID   docusate sodium  100 mg Oral BID   dorzolamide  1 drop Both Eyes BID   finasteride  5 mg Oral Daily   pantoprazole  40 mg Oral Daily   QUEtiapine  25 mg Oral QHS   Continuous Infusions:      LOS: 11 days   Huey Bienenstock, MD [facility] Pager On Amion  If 7PM-7AM, please contact night-coverage 01/29/2021, 2:05 PM

## 2021-01-29 NOTE — Progress Notes (Signed)
OT Cancellation Note  Patient Details Name: Mark Moore MRN: [id number] DOB: Aug 08, 1928   Cancelled Treatment:    Reason Eval/Treat Not Completed: Fatigue/lethargy limiting ability to participate;Other (comment) pt reports fatigue requesting to rest prior to OT session, will check back as time allows.  Lenor Derrick., COTA/L Acute Rehabilitation Services [phone]   Barron Schmid 01/29/2021, 12:13 PM

## 2021-01-29 NOTE — Progress Notes (Signed)
Nightshift RN has agreed to perform the Covid swab during her shift for nursing home placement.

## 2021-01-30 LAB — SARS CORONAVIRUS 2 (TAT 6-24 HRS): SARS Coronavirus 2: NEGATIVE

## 2021-01-30 MED ORDER — ACETAMINOPHEN 325 MG PO TABS: 650.0000 mg | ORAL_TABLET | Freq: Four times a day (QID) | ORAL | Status: DC | PRN

## 2021-01-30 MED ORDER — ONDANSETRON HCL 4 MG PO TABS: 4.0000 mg | ORAL_TABLET | Freq: Three times a day (TID) | ORAL | 0 refills | Status: DC | PRN

## 2021-01-30 NOTE — Progress Notes (Signed)
PROGRESS NOTE    Mark Moore  QMG:[id number] DOB: [birth year] DOA: 01/17/2021 PCP: Wanda Plump, MD    Brief Narrative:  87 y.o. male with medical history significant of afib and HLD presenting with a fall.  His wife died in 05/06/23 and he was hospitalized in January in [place] for FTT.  His family moved him to the [place] in April and he lived with his daughter for a few weeks prior to transitioning to [facility]. Pt was recently treated for UTI with bactrim and also started on Wellbutrin for depression. Pt noted to be increasingly confused over the last week but it acutely worsened 2 days prior to admit.   Assessment & Plan:   Principal Problem:   Fall (on)(from) sidewalk curb, initial encounter Active Problems:   Acute metabolic encephalopathy   Atrial fibrillation, chronic (HCC)   Dementia (HCC)   Depression   Dyslipidemia   Glaucoma   Thoracic compression fracture, closed, initial encounter (HCC)   DNR (do not resuscitate)   Acute encephalopathy  ESBL Klebsiella UTI, POA --started on ceftriaxone on presentation, then switched to meropenem, and then fosfomycin x2 doses, completed treatment.  Fall and weakness -Patient noted to have wandered away from his ALF and was found down outside the facility -Cleared from a trauma standpoint other than thoracic compression fracture (see below) --PT rec SNF rehab --PT and OT will see pt on alternate days to max frequency of visits while inpatient   Acute metabolic encephalopathy, POA, improved Likely hospital delirium -Patient presenting with encephalopathy as evidenced by his confusion -Pt is suspected to have underlying mild dementia PTA, although not yet diagnosed -may be contributed by UTI --agitated at night requiring intermittent IV haldol, then started on seroquel Plan: -She is more sleepy during daytime, will discontinue his Seroquel  Possible Dementia -Not previously formally diagnosed, but has reported  issues with sundowning while hospitalized in January  -Suspect underlying cognitive impairment which is being increasingly unmasked --will return to ALF memory unit after rehab   Afib --rate controlled Plan: --cont cardizem --cont Pradaxa   Depression -Recently on wellbutrin and lexapro, held on admission --cont to hold both   HLD --cont lipitor   Glaucoma -Continue Alphagan, Trusopt   T-spine compression fracture -Neurosurgery consulted -Per Neurosurgery, no acute neurosurgical intervention indicated at this time, no brace needed, activity as tolerated without restrictions  Hypokalemia --replete with oral potassium PRN    DVT prophylaxis: dabigatran Code Status: DNR Family Communication: none at bedside  Status is: Inpatient   Inpatient level of care appropriate due to severity of illness  Dispo: The patient is from: ALF              Anticipated d/c is to: SNF              Patient currently is medically stable to d/c.  Waiting on placement.    Difficult to place patient Yes pt's insurance was not accepted by most SNF  Consultants:  Neurosurgery  Procedures:    Antimicrobials: Anti-infectives (From admission, onward)    Start     Dose/Rate Route Frequency Ordered Stop   01/21/21 1700  meropenem (MERREM) 1 g in sodium chloride 0.9 % 100 mL IVPB  Status:  Discontinued        1 g 200 mL/hr over 30 Minutes Intravenous Every 8 hours 01/21/21 0937 01/21/21 1212   01/21/21 1300  fosfomycin (MONUROL) packet 3 g        3 g  Oral every 72 hours 01/21/21 1212 01/24/21 2200   01/20/21 1130  meropenem (MERREM) 1 g in sodium chloride 0.9 % 100 mL IVPB  Status:  Discontinued        1 g 200 mL/hr over 30 Minutes Intravenous Every 12 hours 01/20/21 1036 01/21/21 0937   01/19/21 1315  cefTRIAXone (ROCEPHIN) 1 g in sodium chloride 0.9 % 100 mL IVPB  Status:  Discontinued        1 g 200 mL/hr over 30 Minutes Intravenous Every 24 hours 01/19/21 1223 01/20/21 1032        Subjective: No complaints, no significant events as discussed with staff.  Patient with good appetite as discussed with staff   Objective: Vitals:   01/29/21 0641 01/29/21 1317 01/29/21 2117 01/30/21 0624  BP: (!) 98/57 (!) 103/58 109/79 108/68  Pulse: 80 81 90 75  Resp: 14  14 16   Temp: 98.8 F (37.1 C) 98.2 F (36.8 C) 98.5 F (36.9 C) 98.7 F (37.1 C)  TempSrc:   Oral Oral  SpO2: 98% 94% 97% 94%  Weight:      Height:        Intake/Output Summary (Last 24 hours) at 01/30/2021 1235 Last data filed at 01/29/2021 1530 Gross per 24 hour  Intake 240 ml  Output --  Net 240 ml    Filed Weights   01/17/21 0722  Weight: 81.6 kg    Examination:  Awake Alert, frail, deconditioned Symmetrical Chest wall movement, Good air movement bilaterally, CTAB RRR,No Gallops,Rubs or new Murmurs, No Parasternal Heave +ve B.Sounds, Abd Soft, No tenderness, No rebound - guarding or rigidity. No Cyanosis, Clubbing or edema, No new Rash or bruise      Data Reviewed: I have personally reviewed following labs and imaging studies  CBC: Recent Labs  Lab 01/24/21 0200 01/25/21 0223 01/26/21 0242 01/29/21 0225  WBC 5.9 6.3 6.7 5.0  HGB 10.8* 11.4* 11.1* 10.0*  HCT 32.6* 34.5* 32.9* 29.8*  MCV 96.4 96.4 96.5 96.1  PLT 214 239 230 264   Basic Metabolic Panel: Recent Labs  Lab 01/24/21 0200 01/25/21 0223 01/26/21 0242 01/29/21 0225  NA 140 136 136 137  K 3.4* 3.4* 3.6 3.6  CL 106 102 103 104  CO2 29 27 27 28   GLUCOSE 84 77 91 89  BUN 12 15 17 20   CREATININE 0.79 0.85 0.85 0.93  CALCIUM 8.7* 9.0 9.0 8.6*  MG 1.7 1.7 1.5* 2.0  PHOS  --   --   --  2.0*   GFR: Estimated Creatinine Clearance: 53.8 mL/min (by C-G formula based on SCr of 0.93 mg/dL). Liver Function Tests: No results for input(s): AST, ALT, ALKPHOS, BILITOT, PROT, ALBUMIN in the last 168 hours.  No results for input(s): LIPASE, AMYLASE in the last 168 hours. No results for input(s): AMMONIA in the last 168  hours. Coagulation Profile: No results for input(s): INR, PROTIME in the last 168 hours.  Cardiac Enzymes: No results for input(s): CKTOTAL, CKMB, CKMBINDEX, TROPONINI in the last 168 hours.  BNP (last 3 results) No results for input(s): PROBNP in the last 8760 hours. HbA1C: No results for input(s): HGBA1C in the last 72 hours. CBG: No results for input(s): GLUCAP in the last 168 hours. Lipid Profile: No results for input(s): CHOL, HDL, LDLCALC, TRIG, CHOLHDL, LDLDIRECT in the last 72 hours. Thyroid Function Tests: No results for input(s): TSH, T4TOTAL, FREET4, T3FREE, THYROIDAB in the last 72 hours. Anemia Panel: No results for input(s): VITAMINB12, FOLATE, FERRITIN, TIBC,  IRON, RETICCTPCT in the last 72 hours. Sepsis Labs: No results for input(s): PROCALCITON, LATICACIDVEN in the last 168 hours.   Recent Results (from the past 240 hour(s))  SARS CORONAVIRUS 2 (TAT 6-24 HRS) Nasopharyngeal Nasopharyngeal Swab     Status: None   Collection Time: 01/30/21  4:17 AM   Specimen: Nasopharyngeal Swab  Result Value Ref Range Status   SARS Coronavirus 2 NEGATIVE NEGATIVE Final    Comment: (NOTE) SARS-CoV-2 target nucleic acids are NOT DETECTED.  The SARS-CoV-2 RNA is generally detectable in upper and lower respiratory specimens during the acute phase of infection. Negative results do not preclude SARS-CoV-2 infection, do not rule out co-infections with other pathogens, and should not be used as the sole basis for treatment or other patient management decisions. Negative results must be combined with clinical observations, patient history, and epidemiological information. The expected result is Negative.  Fact Sheet for Patients: HairSlick.no  Fact Sheet for Healthcare Providers: quierodirigir.com  This test is not yet approved or cleared by the [place] FDA and  has been authorized for detection and/or diagnosis of  SARS-CoV-2 by FDA under an Emergency Use Authorization (EUA). This EUA will remain  in effect (meaning this test can be used) for the duration of the COVID-19 declaration under Se ction 564(b)(1) of the Act, 21 U.S.C. section 360bbb-3(b)(1), unless the authorization is terminated or revoked sooner.  Performed at [facility], [address] [place], [place] [address]       Radiology Studies: No results found.  Scheduled Meds:  atorvastatin  10 mg Oral Daily   brimonidine  1 drop Both Eyes TID   dabigatran  150 mg Oral Q12H   diltiazem  30 mg Oral BID   docusate sodium  100 mg Oral BID   dorzolamide  1 drop Both Eyes BID   finasteride  5 mg Oral Daily   pantoprazole  40 mg Oral Daily   Continuous Infusions:      LOS: 12 days   Huey Bienenstock, MD [facility] Pager On Amion  If 7PM-7AM, please contact night-coverage 01/30/2021, 12:35 PM

## 2021-01-30 NOTE — TOC Progression Note (Addendum)
Transition of Care [facility]) - Progression Note    Patient Details  Name: Mark Moore MRN: [id number] Date of Birth: August 03, 1928  Transition of Care [facility]) CM/SW Contact  Carley Hammed, [place] Phone Number: 01/30/2021, 12:41 PM  Clinical Narrative:    CSW was notified by MD that pt is ready for DC. CSW referred to the notes that stated pt was still bein reviewed at [facility] and that insurance was started yesterday evening. MD told CSW that he was advised pt could go today. CSW followed up with facility. They stated they spoke with someone late yesterday and confirmed bed offer, but could not offer one until Monday. Insurance authorization is still pending. CSW will continue to follow for insurance auth and will attempt to have everything ready for pt to DC Monday. Daughter was updated.   3:56p CSW received authorization for rehab: [facility] ID: [id number] Navi ID: [id number] 7/2- 7/6 CM: Perlie Gold  CSW updated pt's daughter.   Expected Discharge Plan: Skilled Nursing Facility Barriers to Discharge: Continued Medical Work up  Expected Discharge Plan and Services Expected Discharge Plan: Skilled Nursing Facility In-house Referral: Clinical Social Work     Living arrangements for the past 2 months: Assisted Living Facility Expected Discharge Date: 01/30/21                                     Social Determinants of Health (SDOH) Interventions    Readmission Risk Interventions No flowsheet data found.

## 2021-01-30 NOTE — Progress Notes (Signed)
Covid test sent to lab.

## 2021-01-31 DIAGNOSIS — G9341 Metabolic encephalopathy: Secondary | ICD-10-CM

## 2021-01-31 MED ORDER — K PHOS MONO-SOD PHOS DI & MONO 155-852-130 MG PO TABS
500.0000 mg | ORAL_TABLET | Freq: Three times a day (TID) | ORAL | Status: AC
  Administered 2021-01-31 (×3): 500 mg via ORAL
  Filled 2021-01-31 (×3): qty 2

## 2021-01-31 NOTE — Progress Notes (Signed)
PROGRESS NOTE    Mark Moore  OZH:[id number] DOB: 11/14/1928 DOA: 01/17/2021 PCP: Wanda Plump, MD    Brief Narrative:  87 y.o. male with medical history significant of afib and HLD presenting with a fall.  His wife died in 04/29/2023 and he was hospitalized in January in [place] for FTT.  His family moved him to the [place] in April and he lived with his daughter for a few weeks prior to transitioning to [facility]. Pt was recently treated for UTI with bactrim and also started on Wellbutrin for depression. Pt noted to be increasingly confused over the last week but it acutely worsened 2 days prior to admit.   Assessment & Plan:   Principal Problem:   Fall (on)(from) sidewalk curb, initial encounter Active Problems:   Acute metabolic encephalopathy   Atrial fibrillation, chronic (HCC)   Dementia (HCC)   Depression   Dyslipidemia   Glaucoma   Thoracic compression fracture, closed, initial encounter (HCC)   DNR (do not resuscitate)   Acute encephalopathy  ESBL Klebsiella UTI, POA --started on ceftriaxone on presentation, then switched to meropenem, and then fosfomycin x2 doses, completed treatment.  Fall and weakness -Patient noted to have wandered away from his ALF and was found down outside the facility -Cleared from a trauma standpoint other than thoracic compression fracture (see below) --PT rec SNF rehab --PT and OT will see pt on alternate days to max frequency of visits while inpatient   Acute metabolic encephalopathy, POA, improved Likely hospital delirium -Patient presenting with encephalopathy as evidenced by his confusion -Pt is suspected to have underlying mild dementia PTA, although not yet diagnosed -may be contributed by UTI --agitated at night requiring intermittent IV haldol, then started on seroquel Plan: -She is more sleepy during daytime, will discontinue his Seroquel  Possible Dementia -Not previously formally diagnosed, but has reported  issues with sundowning while hospitalized in January  -Suspect underlying cognitive impairment which is being increasingly unmasked --will return to ALF memory unit after rehab   Afib --rate controlled Plan: --cont cardizem --cont Pradaxa   Depression -Recently on wellbutrin and lexapro, held on admission --cont to hold both   HLD --cont lipitor   Glaucoma -Continue Alphagan, Trusopt   T-spine compression fracture -Neurosurgery consulted -Per Neurosurgery, no acute neurosurgical intervention indicated at this time, no brace needed, activity as tolerated without restrictions  Hypokalemia --replete with oral potassium PRN   Hypophosphatemia -Phosphorus was low at 2 recently, repleted, will recheck in a.m.   DVT prophylaxis: dabigatran Code Status: DNR Family Communication: none at bedside  Status is: Inpatient   Inpatient level of care appropriate due to severity of illness  Dispo: The patient is from: ALF              Anticipated d/c is to: SNF              Patient currently is medically stable to d/c.  Awaiting insurance authorization and bed availability   Difficult to place patient Yes pt's insurance was not accepted by most SNF  Consultants:  Neurosurgery  Procedures:    Antimicrobials: Anti-infectives (From admission, onward)    Start     Dose/Rate Route Frequency Ordered Stop   01/21/21 1700  meropenem (MERREM) 1 g in sodium chloride 0.9 % 100 mL IVPB  Status:  Discontinued        1 g 200 mL/hr over 30 Minutes Intravenous Every 8 hours 01/21/21 0937 01/21/21 1212   01/21/21 1300  fosfomycin (MONUROL) packet 3 g        3 g Oral every 72 hours 01/21/21 1212 01/24/21 2200   01/20/21 1130  meropenem (MERREM) 1 g in sodium chloride 0.9 % 100 mL IVPB  Status:  Discontinued        1 g 200 mL/hr over 30 Minutes Intravenous Every 12 hours 01/20/21 1036 01/21/21 0937   01/19/21 1315  cefTRIAXone (ROCEPHIN) 1 g in sodium chloride 0.9 % 100 mL IVPB  Status:   Discontinued        1 g 200 mL/hr over 30 Minutes Intravenous Every 24 hours 01/19/21 1223 01/20/21 1032       Subjective: No significant events overnight as discussed with staff.   Objective: Vitals:   01/30/21 0624 01/30/21 1419 01/30/21 2039 01/31/21 0552  BP: 108/68 102/63 113/72 103/63  Pulse: 75 68 82 94  Resp: 16 18 16 16   Temp: 98.7 F (37.1 C) 99.4 F (37.4 C) 98 F (36.7 C) (!) 97.4 F (36.3 C)  TempSrc: Oral Axillary Oral Oral  SpO2: 94% 97% 95% 95%  Weight:      Height:       No intake or output data in the 24 hours ending 01/31/21 1205   Filed Weights   01/17/21 0722  Weight: 81.6 kg    Examination:  Awake Alert, frail, deconditioned, no apparent distress. Symmetrical Chest wall movement, Good air movement bilaterally, CTAB RRR,No Gallops,Rubs or new Murmurs, No Parasternal Heave +ve B.Sounds, Abd Soft, No tenderness, No rebound - guarding or rigidity. No Cyanosis, Clubbing or edema, No new Rash or bruise       Data Reviewed: I have personally reviewed following labs and imaging studies  CBC: Recent Labs  Lab 01/25/21 0223 01/26/21 0242 01/29/21 0225  WBC 6.3 6.7 5.0  HGB 11.4* 11.1* 10.0*  HCT 34.5* 32.9* 29.8*  MCV 96.4 96.5 96.1  PLT 239 230 264   Basic Metabolic Panel: Recent Labs  Lab 01/25/21 0223 01/26/21 0242 01/29/21 0225  NA 136 136 137  K 3.4* 3.6 3.6  CL 102 103 104  CO2 27 27 28   GLUCOSE 77 91 89  BUN 15 17 20   CREATININE 0.85 0.85 0.93  CALCIUM 9.0 9.0 8.6*  MG 1.7 1.5* 2.0  PHOS  --   --  2.0*   GFR: Estimated Creatinine Clearance: 53.8 mL/min (by C-G formula based on SCr of 0.93 mg/dL). Liver Function Tests: No results for input(s): AST, ALT, ALKPHOS, BILITOT, PROT, ALBUMIN in the last 168 hours.  No results for input(s): LIPASE, AMYLASE in the last 168 hours. No results for input(s): AMMONIA in the last 168 hours. Coagulation Profile: No results for input(s): INR, PROTIME in the last 168  hours.  Cardiac Enzymes: No results for input(s): CKTOTAL, CKMB, CKMBINDEX, TROPONINI in the last 168 hours.  BNP (last 3 results) No results for input(s): PROBNP in the last 8760 hours. HbA1C: No results for input(s): HGBA1C in the last 72 hours. CBG: No results for input(s): GLUCAP in the last 168 hours. Lipid Profile: No results for input(s): CHOL, HDL, LDLCALC, TRIG, CHOLHDL, LDLDIRECT in the last 72 hours. Thyroid Function Tests: No results for input(s): TSH, T4TOTAL, FREET4, T3FREE, THYROIDAB in the last 72 hours. Anemia Panel: No results for input(s): VITAMINB12, FOLATE, FERRITIN, TIBC, IRON, RETICCTPCT in the last 72 hours. Sepsis Labs: No results for input(s): PROCALCITON, LATICACIDVEN in the last 168 hours.   Recent Results (from the past 240 hour(s))  SARS CORONAVIRUS 2 (  TAT 6-24 HRS) Nasopharyngeal Nasopharyngeal Swab     Status: None   Collection Time: 01/30/21  4:17 AM   Specimen: Nasopharyngeal Swab  Result Value Ref Range Status   SARS Coronavirus 2 NEGATIVE NEGATIVE Final    Comment: (NOTE) SARS-CoV-2 target nucleic acids are NOT DETECTED.  The SARS-CoV-2 RNA is generally detectable in upper and lower respiratory specimens during the acute phase of infection. Negative results do not preclude SARS-CoV-2 infection, do not rule out co-infections with other pathogens, and should not be used as the sole basis for treatment or other patient management decisions. Negative results must be combined with clinical observations, patient history, and epidemiological information. The expected result is Negative.  Fact Sheet for Patients: HairSlick.no  Fact Sheet for Healthcare Providers: quierodirigir.com  This test is not yet approved or cleared by the [place] FDA and  has been authorized for detection and/or diagnosis of SARS-CoV-2 by FDA under an Emergency Use Authorization (EUA). This EUA will remain  in  effect (meaning this test can be used) for the duration of the COVID-19 declaration under Se ction 564(b)(1) of the Act, 21 U.S.C. section 360bbb-3(b)(1), unless the authorization is terminated or revoked sooner.  Performed at [facility], [address], [place], [place] [address]       Radiology Studies: No results found.  Scheduled Meds:  atorvastatin  10 mg Oral Daily   brimonidine  1 drop Both Eyes TID   dabigatran  150 mg Oral Q12H   diltiazem  30 mg Oral BID   docusate sodium  100 mg Oral BID   dorzolamide  1 drop Both Eyes BID   finasteride  5 mg Oral Daily   pantoprazole  40 mg Oral Daily   phosphorus  500 mg Oral TID   Continuous Infusions:      LOS: 13 days   Huey Bienenstock, MD [facility] Pager On Amion  If 7PM-7AM, please contact night-coverage 01/31/2021, 12:05 PM

## 2021-02-01 DIAGNOSIS — S22080A Wedge compression fracture of T11-T12 vertebra, initial encounter for closed fracture: Secondary | ICD-10-CM

## 2021-02-01 DIAGNOSIS — F0390 Unspecified dementia without behavioral disturbance: Secondary | ICD-10-CM

## 2021-02-01 LAB — CBC
HCT: 33 % — ABNORMAL LOW (ref 39.0–52.0)
Hemoglobin: 11.2 g/dL — ABNORMAL LOW (ref 13.0–17.0)
MCH: 33.3 pg (ref 26.0–34.0)
MCHC: 33.9 g/dL (ref 30.0–36.0)
MCV: 98.2 fL (ref 80.0–100.0)
Platelets: 329 K/uL (ref 150–400)
RBC: 3.36 MIL/uL — ABNORMAL LOW (ref 4.22–5.81)
RDW: 15.8 % — ABNORMAL HIGH (ref 11.5–15.5)
WBC: 6.4 K/uL (ref 4.0–10.5)
nRBC: 0 % (ref 0.0–0.2)

## 2021-02-01 LAB — PHOSPHORUS: Phosphorus: 2.9 mg/dL (ref 2.5–4.6)

## 2021-02-01 LAB — BASIC METABOLIC PANEL WITH GFR
Anion gap: 5 (ref 5–15)
BUN: 20 mg/dL (ref 8–23)
CO2: 29 mmol/L (ref 22–32)
Calcium: 8.9 mg/dL (ref 8.9–10.3)
Chloride: 105 mmol/L (ref 98–111)
Creatinine, Ser: 0.9 mg/dL (ref 0.61–1.24)
GFR, Estimated: >60 mL/min (ref >60)
Glucose, Bld: 82 mg/dL (ref 70–99)
Potassium: 3.8 mmol/L (ref 3.5–5.1)
Sodium: 139 mmol/L (ref 135–145)

## 2021-02-01 NOTE — TOC Progression Note (Addendum)
Transition of Care [facility]) - Progression Note    Patient Details  Name: Abishai Viegas MRN: [id number] Date of Birth: 1929/06/12  Transition of Care [facility]) CM/SW Contact  Beckie Busing, RN Phone [phone]  02/01/2021, 10:11 AM  Clinical Narrative:    Insurance Berkley Harvey has been approved  Plan auth ID [id number] auth ID [id number] approved 7/2-7/6. CM called [facility] and spoke with Fritzi Mandes who states that patient is able to come to [facility] today. Message has been sent to MD. D/c summary pending.  1040 Discharge summary has been sent to [facility]. PASRR info has been given to Fritzi Mandes at [facility] per request PASRR # [id number].    Expected Discharge Plan: Skilled Nursing Facility Barriers to Discharge: Continued Medical Work up  Expected Discharge Plan and Services Expected Discharge Plan: Skilled Nursing Facility In-house Referral: Clinical Social Work     Living arrangements for the past 2 months: Assisted Living Facility Expected Discharge Date: 01/30/21                                     Social Determinants of Health (SDOH) Interventions    Readmission Risk Interventions No flowsheet data found.

## 2021-02-01 NOTE — Plan of Care (Signed)

## 2021-02-01 NOTE — TOC Transition Note (Signed)
Transition of Care [facility]) - CM/SW Discharge Note   Patient Details  Name: Mark Moore MRN: [id number] Date of Birth: 1929-02-03  Transition of Care [facility]) CM/SW Contact:  Beckie Busing, RN Phone [phone]  02/01/2021, 11:05 AM   Clinical Narrative:    Patient is ready to be discharged to [facility]. Daughter [name] has been updated. Transportation has been scheduled with [facility].   Please call report to [facility]  [phone] Ask for the [facility] nurse      Barriers to Discharge: Continued Medical Work up   Patient Goals and CMS Choice        Discharge Placement                       Discharge Plan and Services In-house Referral: Clinical Social Work                                   Social Determinants of Health (SDOH) Interventions     Readmission Risk Interventions No flowsheet data found.

## 2021-02-01 NOTE — Discharge Summary (Signed)
Physician Discharge Summary  Harlem Bula WUJ:[id number] DOB: 1929/03/28 DOA: 01/17/2021  PCP: Wanda Plump, MD  Admit date: 01/17/2021 Discharge date: 02/01/2021  Admitted From: Home Disposition:  SNF  Recommendations for Outpatient Follow-up:  Please obtain BMP/CBC in one week  Discharge Condition:Stable CODE STATUS:DNR Diet recommendation: Heart Healthy  Brief/Interim Summary: 87 y.o. male with medical history significant of afib and HLD presenting with a fall.  His wife died in 05/10/23 and he was hospitalized in [place] in [place] for FTT.  His family moved him to the [place] in April and he lived with his daughter for a few weeks prior to transitioning to [facility]. Pt was recently treated for UTI with bactrim and also started on Wellbutrin for depression. Pt noted to be increasingly confused over the last week but it acutely worsened 2 days prior to admit.    ESBL Klebsiella UTI, POA --started on ceftriaxone on presentation, then switched to meropenem, and then fosfomycin x2 doses, completed treatment.   Fall and weakness -Patient noted to have wandered away from his ALF and was found down outside the facility -Cleared from a trauma standpoint other than thoracic compression fracture (see below) --PT rec SNF rehab   Acute metabolic encephalopathy, POA, improved Likely hospital delirium -Patient presenting with encephalopathy as evidenced by his confusion -Pt is suspected to have underlying mild dementia PTA, although not yet diagnosed -may be contributed by UTI -Patient with some hospital delirium, this has significantly improved, I have discontinued his Seroquel given some lethargy during daytime.    Possible Dementia -Not previously formally diagnosed, but has reported issues with sundowning while hospitalized in January -Suspect underlying cognitive impairment which is being increasingly unmasked   Afib --rate controlled Plan: --cont cardizem --cont  Pradaxa   Depression -Recently on wellbutrin and lexapro, held on admission, will continue to hold on discharge as family thinks might contributing to worsening mental status after Wellbutrin was started.    HLD --cont lipitor   Glaucoma -Continue Alphagan, Trusopt   T-spine compression fracture -Neurosurgery consulted -Per Neurosurgery, no acute neurosurgical intervention indicated at this time, no brace needed, activity as tolerated without restrictions   Hypokalemia --repleted   Discharge Diagnoses:  Principal Problem:   Fall (on)(from) sidewalk curb, initial encounter Active Problems:   Acute metabolic encephalopathy   Atrial fibrillation, chronic (HCC)   Dementia (HCC)   Depression   Dyslipidemia   Glaucoma   Thoracic compression fracture, closed, initial encounter (HCC)   DNR (do not resuscitate)   Acute encephalopathy    Discharge Instructions  Discharge Instructions     Diet - low sodium heart healthy   Complete by: As directed    Increase activity slowly   Complete by: As directed    No wound care   Complete by: As directed       Allergies as of 02/01/2021       Reactions   Wellbutrin [bupropion] Other (See Comments)   XL 150 mg- Made the patient feel paranoid and have borderline hallucinations        Medication List     STOP taking these medications    buPROPion 150 MG 24 hr tablet Commonly known as: WELLBUTRIN XL   doxycycline 100 MG tablet Commonly known as: VIBRA-TABS   escitalopram 5 MG tablet Commonly known as: LEXAPRO   spironolactone 25 MG tablet Commonly known as: ALDACTONE   sulfamethoxazole-trimethoprim 800-160 MG tablet Commonly known as: BACTRIM DS       TAKE  these medications    acetaminophen 325 MG tablet Commonly known as: TYLENOL Take 2 tablets (650 mg total) by mouth every 6 (six) hours as needed for mild pain (or Fever >/= 101).   atorvastatin 10 MG tablet Commonly known as: LIPITOR Take 10 mg by mouth  daily.   brimonidine 0.2 % ophthalmic solution Commonly known as: ALPHAGAN Place 1 drop into both eyes 2 (two) times daily.   dabigatran 150 MG Caps capsule Commonly known as: PRADAXA Take 150 mg by mouth 2 (two) times daily.   diltiazem 30 MG tablet Commonly known as: CARDIZEM Take 30 mg by mouth 2 (two) times daily.   dorzolamide 2 % ophthalmic solution Commonly known as: TRUSOPT Place 1 drop into both eyes 2 (two) times daily.   finasteride 5 MG tablet Commonly known as: PROSCAR Take 5 mg by mouth daily.   ondansetron 4 MG tablet Commonly known as: ZOFRAN Take 1 tablet (4 mg total) by mouth every 8 (eight) hours as needed for nausea.   pantoprazole 40 MG tablet Commonly known as: PROTONIX Take 40 mg by mouth daily before breakfast.        Allergies  Allergen Reactions   Wellbutrin [Bupropion] Other (See Comments)    XL 150 mg- Made the patient feel paranoid and have borderline hallucinations    Consultations: neurosurgery   Procedures/Studies: DG Knee 2 Views Left  Result Date: 01/17/2021 CLINICAL DATA:  Pain post fall. EXAM: LEFT KNEE - 1-2 VIEW COMPARISON:  None. FINDINGS: No evidence of fracture, dislocation, or joint effusion. No evidence of arthropathy or other focal bone abnormality. Mild suprapatellar soft tissue edema. IMPRESSION: Negative. Electronically Signed   By: Ted Mcalpine M.D.   On: 01/17/2021 11:02   DG Knee 2 Views Right  Result Date: 01/17/2021 CLINICAL DATA:  Patient status post fall. EXAM: RIGHT KNEE - 1-2 VIEW COMPARISON:  None. FINDINGS: Normal anatomic alignment. No evidence for acute fracture or dislocation. Regional soft tissues unremarkable. Vascular calcifications. IMPRESSION: No acute osseous abnormality. Electronically Signed   By: Annia Belt M.D.   On: 01/17/2021 11:03   CT HEAD WO CONTRAST  Result Date: 01/17/2021 CLINICAL DATA:  87 year old male found down. On anticoagulation. EXAM: CT HEAD WITHOUT CONTRAST TECHNIQUE:  Contiguous axial images were obtained from the base of the skull through the vertex without intravenous contrast. COMPARISON:  None. FINDINGS: Brain: No midline shift, ventriculomegaly, mass effect, evidence of mass lesion, intracranial hemorrhage or evidence of cortically based acute infarction. Patchy and confluent fairly symmetric bilateral cerebral white matter hypodensity. Small area of chronic appearing cortical encephalomalacia also in the anterior right temporal lobe tip (sagittal image 17). Elsewhere preserved gray-white matter differentiation. No other cortical encephalomalacia identified. Vascular: Calcified atherosclerosis at the skull base. No suspicious intracranial vascular hyperdensity. Skull: No fracture identified. Sinuses/Orbits: Left middle ear and mastoids are opacified. And there is chronic right posterior ethmoid and bilateral sphenoid sinus disease with mucoperiosteal thickening. Other sinuses, right tympanic cavity and mastoids are clear. Other: Right forehead scalp hematoma is broad-based, 6 mm in thickness. Visible orbits soft tissues appear to remain normal. No scalp soft tissue gas identified. IMPRESSION: 1. Right forehead scalp hematoma without underlying skull fracture. 2. No acute intracranial abnormality. Moderately advanced bilateral white matter changes most commonly due to small vessel disease. Chronic encephalomalacia at the right temporal lobe tip, might be sequelae of remote trauma. 3. Chronic paranasal sinus disease. Left middle ear and mastoid opacification are likely inflammatory, such as otitis media or cholesteatoma. Electronically  Signed   By: Odessa FlemingH  Hall M.D.   On: 01/17/2021 08:55   CT CERVICAL SPINE WO CONTRAST  Result Date: 01/17/2021 CLINICAL DATA:  87 year old male found down. On anticoagulation. EXAM: CT CERVICAL SPINE WITHOUT CONTRAST TECHNIQUE: Multidetector CT imaging of the cervical spine was performed without intravenous contrast. Multiplanar CT image  reconstructions were also generated. COMPARISON:  Head CT today. FINDINGS: Alignment: Mild straightening of cervical lordosis. Cervicothoracic junction alignment is within normal limits. Bilateral posterior element alignment is within normal limits. Skull base and vertebrae: Visualized skull base is intact. No atlanto-occipital dissociation. Maintained C1-C2 alignment, in those levels appear intact. No acute osseous abnormality identified. Soft tissues and spinal canal: No prevertebral fluid or swelling. No visible canal hematoma. Bulky left carotid calcified atherosclerosis in the neck. Disc levels: Upper cervical facet degeneration, possible developing facet ankylosis on the right at C2-C3. Widespread cervical disc bulging and mild endplate spurring. Suspect mild degenerative spinal stenosis at C4-C5 when combined with ligament flavum hypertrophy. Upper chest: Visible upper thoracic levels appear intact. Negative lung apices. Partially visible calcified aortic arch atherosclerosis. Small volume retained secretions along the left wall of the trachea at the thoracic inlet. Other: Negative visible posterior fossa. IMPRESSION: 1. No acute traumatic injury identified in the cervical spine. 2. Cervical spine degeneration with suspected mild spinal stenosis at C4-C5. 3. Calcified left carotid atherosclerosis in the neck. 4. Aortic Atherosclerosis (ICD10-I70.0). Electronically Signed   By: Odessa FlemingH  Hall M.D.   On: 01/17/2021 09:22   CT CHEST ABDOMEN PELVIS W CONTRAST  Result Date: 01/17/2021 CLINICAL DATA:  87 year old male found down.  On anticoagulation. EXAM: CT CHEST, ABDOMEN, AND PELVIS WITH CONTRAST TECHNIQUE: Multidetector CT imaging of the chest, abdomen and pelvis was performed following the standard protocol during bolus administration of intravenous contrast. CONTRAST:  100mL OMNIPAQUE IOHEXOL 300 MG/ML  SOLN COMPARISON:  Portable chest x-ray today. FINDINGS: CT CHEST FINDINGS Cardiovascular: Tortuous thoracic  aorta with calcified atherosclerosis. Extensive coronary artery calcified plaque or stents. Major vascular structures in the chest appear intact. Central pulmonary arteries appear patent. Cardiomegaly with no pericardial effusion. Mediastinum/Nodes: Negative. No mediastinal hematoma or lymphadenopathy. Lungs/Pleura: Trace retained secretions in the trachea below the thoracic inlet. Otherwise the major airways are patent. Mild dependent opacity in the lungs, greater on the left, most resembles atelectasis. No pneumothorax or pleural effusion. Superimposed mild right middle lobe lateral segment scarring including a small triangular nodule in the minor fissure which is probably an intrapleural lymph node (series 4, image 43). No pneumothorax. No pleural effusion. No convincing pulmonary contusion. Musculoskeletal: T12 superior endplate compression fracture appears acute with subchondral lucency. The T12 pedicles and posterior elements appear intact. There is no significant retropulsion. 35% loss of height. Other thoracic vertebrae appear intact. Intact sternum. Visible shoulder osseous structures appear intact. Chronic appearing posterior left 8th and 9th rib fractures. No convincing acute rib fracture. CT ABDOMEN PELVIS FINDINGS Hepatobiliary: Small circumscribed low-density area in the central liver on series 4, image 47 does not appear traumatic and is probably a benign cyst. Elsewhere the liver and gallbladder are within normal limits. Pancreas: Negative. Spleen: The spleen appears intact.  No perisplenic fluid. Adrenals/Urinary Tract: Normal adrenal glands. Symmetric renal enhancement and contrast excretion. Benign left renal parapelvic cyst. Left midpole nephrolithiasis. Decompressed proximal ureters. Unremarkable bladder. Numerous pelvic phleboliths. Stomach/Bowel: Gas and liquid stool in the rectum. Redundant sigmoid colon which tracks into the right upper quadrant. Redundant transverse colon and flexures  containing gas. Retained stool in the right colon.  And the cecum is on a lax mesentery in the mid abdomen. No large bowel inflammation. Appendix not delineated. No dilated small bowel. Decompressed stomach and duodenum. No free air or free fluid identified. Vascular/Lymphatic: Aortoiliac soft and calcified atherosclerosis. Tortuous infrarenal abdominal aorta. Major arterial structures remain patent. Portal venous timing is early. On delayed images the portal venous system appears to be patent. No lymphadenopathy. Reproductive: Negative. Other: No pelvic free fluid. Musculoskeletal: L4 superior endplate compression fracture with 35% loss of vertebral body height. L4 pedicles and posterior elements appear intact. No significant retropulsion. This appears age indeterminate. Superimposed grade 1 spondylolisthesis of L4 on L5. Other lumbar levels appear intact. Sacrum, SI joints, pelvis and proximal femurs are intact. IMPRESSION: 1. Acute T12 compression fractures with 35% loss of height. No significant retropulsion or complicating features. 2. Age indeterminate L4 compression fracture with similar loss of height, no retropulsion. Underlying chronic degenerative appearing grade 1 spondylolisthesis at L4-L5. 3. No other acute traumatic injury identified in the chest, abdomen, or pelvis. 4. Cardiomegaly. Extensive coronary artery and Aortic Atherosclerosis (ICD10-I70.0). Left nephrolithiasis. Redundant large bowel. Electronically Signed   By: Odessa Fleming M.D.   On: 01/17/2021 09:08   DG Chest Port 1 View  Result Date: 01/17/2021 CLINICAL DATA:  Male of unknown age status post fall. Found down. On anticoagulation. EXAM: PORTABLE CHEST 1 VIEW COMPARISON:  None. FINDINGS: Portable AP supine view at 0717 hours. Mildly tortuous thoracic aorta with calcified atherosclerosis. Other mediastinal contours are within normal limits. Visualized tracheal air column is within normal limits. Allowing for portable technique the lungs are  clear. No pneumothorax or pleural effusion identified on this supine view. Abundant gas-filled but nondilated bowel loops in the upper abdomen. No displaced rib fracture identified. No acute osseous abnormality identified. IMPRESSION: No acute cardiopulmonary abnormality or acute traumatic injury identified. Electronically Signed   By: Odessa Fleming M.D.   On: 01/17/2021 07:47      Subjective: No significant events overnight as discussed with staff Discharge Exam: Vitals:   01/31/21 1401 01/31/21 2122  BP: 93/61 (!) 90/50  Pulse: 77 84  Resp: 18 20  Temp: (!) 97.5 F (36.4 C) 97.7 F (36.5 C)  SpO2: 98% 98%   Vitals:   01/30/21 2039 01/31/21 0552 01/31/21 1401 01/31/21 2122  BP: 113/72 103/63 93/61 (!) 90/50  Pulse: 82 94 77 84  Resp: 16 16 18 20   Temp: 98 F (36.7 C) (!) 97.4 F (36.3 C) (!) 97.5 F (36.4 C) 97.7 F (36.5 C)  TempSrc: Oral Oral    SpO2: 95% 95% 98% 98%  Weight:      Height:        Awake Alert, frail, deconditioned, Symmetrical Chest wall movement, Good air movement bilaterally, CTAB RRR,No Gallops,Rubs or new Murmurs, No Parasternal Heave +ve B.Sounds, Abd Soft, No tenderness, No rebound - guarding or rigidity. No Cyanosis, Clubbing or edema, No new Rash or bruise      The results of significant diagnostics from this hospitalization (including imaging, microbiology, ancillary and laboratory) are listed below for reference.     Microbiology: Recent Results (from the past 240 hour(s))  SARS CORONAVIRUS 2 (TAT 6-24 HRS) Nasopharyngeal Nasopharyngeal Swab     Status: None   Collection Time: 01/30/21  4:17 AM   Specimen: Nasopharyngeal Swab  Result Value Ref Range Status   SARS Coronavirus 2 NEGATIVE NEGATIVE Final    Comment: (NOTE) SARS-CoV-2 target nucleic acids are NOT DETECTED.  The SARS-CoV-2 RNA is generally detectable  in upper and lower respiratory specimens during the acute phase of infection. Negative results do not preclude SARS-CoV-2  infection, do not rule out co-infections with other pathogens, and should not be used as the sole basis for treatment or other patient management decisions. Negative results must be combined with clinical observations, patient history, and epidemiological information. The expected result is Negative.  Fact Sheet for Patients: HairSlick.no  Fact Sheet for Healthcare Providers: quierodirigir.com  This test is not yet approved or cleared by the [place] FDA and  has been authorized for detection and/or diagnosis of SARS-CoV-2 by FDA under an Emergency Use Authorization (EUA). This EUA will remain  in effect (meaning this test can be used) for the duration of the COVID-19 declaration under Se ction 564(b)(1) of the Act, 21 U.S.C. section 360bbb-3(b)(1), unless the authorization is terminated or revoked sooner.  Performed at [facility], [address], [place], [place] [address]      Labs: BNP (last 3 results) No results for input(s): BNP in the last 8760 hours. Basic Metabolic Panel: Recent Labs  Lab 01/26/21 0242 01/29/21 0225 02/01/21 0340  NA 136 137 139  K 3.6 3.6 3.8  CL 103 104 105  CO2 GLUCOSE 91 89 82  BUN CREATININE 0.85 0.93 0.90  CALCIUM 9.0 8.6* 8.9  MG 1.5* 2.0  --   PHOS  --  2.0* 2.9    Liver Function Tests: No results for input(s): AST, ALT, ALKPHOS, BILITOT, PROT, ALBUMIN in the last 168 hours. No results for input(s): LIPASE, AMYLASE in the last 168 hours. No results for input(s): AMMONIA in the last 168 hours. CBC: Recent Labs  Lab 01/26/21 0242 01/29/21 0225 02/01/21 0340  WBC 6.7 5.0 6.4  HGB 11.1* 10.0* 11.2*  HCT 32.9* 29.8* 33.0*  MCV 96.5 96.1 98.2  PLT 230 264 329    Cardiac Enzymes: No results for input(s): CKTOTAL, CKMB, CKMBINDEX, TROPONINI in the last 168 hours. BNP: Invalid input(s): POCBNP CBG: No results for input(s): GLUCAP in the  last 168 hours. D-Dimer No results for input(s): DDIMER in the last 72 hours. Hgb A1c No results for input(s): HGBA1C in the last 72 hours. Lipid Profile No results for input(s): CHOL, HDL, LDLCALC, TRIG, CHOLHDL, LDLDIRECT in the last 72 hours. Thyroid function studies No results for input(s): TSH, T4TOTAL, T3FREE, THYROIDAB in the last 72 hours.  Invalid input(s): FREET3 Anemia work up No results for input(s): VITAMINB12, FOLATE, FERRITIN, TIBC, IRON, RETICCTPCT in the last 72 hours. Urinalysis    Component Value Date/Time   COLORURINE YELLOW 01/17/2021 0716   APPEARANCEUR CLEAR 01/17/2021 0716   LABSPEC 1.010 01/17/2021 0716   PHURINE 5.5 01/17/2021 0716   GLUCOSEU NEGATIVE 01/17/2021 0716   HGBUR MODERATE (A) 01/17/2021 0716   BILIRUBINUR NEGATIVE 01/17/2021 0716   KETONESUR NEGATIVE 01/17/2021 0716   PROTEINUR NEGATIVE 01/17/2021 0716   NITRITE NEGATIVE 01/17/2021 0716   LEUKOCYTESUR TRACE (A) 01/17/2021 0716   Sepsis Labs Invalid input(s): PROCALCITONIN,  WBC,  LACTICIDVEN Microbiology Recent Results (from the past 240 hour(s))  SARS CORONAVIRUS 2 (TAT 6-24 HRS) Nasopharyngeal Nasopharyngeal Swab     Status: None   Collection Time: 01/30/21  4:17 AM   Specimen: Nasopharyngeal Swab  Result Value Ref Range Status   SARS Coronavirus 2 NEGATIVE NEGATIVE Final    Comment: (NOTE) SARS-CoV-2 target nucleic acids are NOT DETECTED.  The SARS-CoV-2 RNA is generally detectable in upper and lower respiratory specimens  during the acute phase of infection. Negative results do not preclude SARS-CoV-2 infection, do not rule out co-infections with other pathogens, and should not be used as the sole basis for treatment or other patient management decisions. Negative results must be combined with clinical observations, patient history, and epidemiological information. The expected result is Negative.  Fact Sheet for Patients: HairSlick.no  Fact  Sheet for Healthcare Providers: quierodirigir.com  This test is not yet approved or cleared by the [place] FDA and  has been authorized for detection and/or diagnosis of SARS-CoV-2 by FDA under an Emergency Use Authorization (EUA). This EUA will remain  in effect (meaning this test can be used) for the duration of the COVID-19 declaration under Se ction 564(b)(1) of the Act, 21 U.S.C. section 360bbb-3(b)(1), unless the authorization is terminated or revoked sooner.  Performed at [facility], [address] [place], [place] [address]      Time coordinating discharge: Over 30 minutes  SIGNED:   Huey Bienenstock, MD  [facility] 02/01/2021, 10:29 AM Pager   If 7PM-7AM, please contact night-coverage [email or web address] Password [email or web address]

## 2021-02-02 ENCOUNTER — Encounter: Payer: Self-pay | Admitting: Internal Medicine

## 2021-02-07 ENCOUNTER — Emergency Department (HOSPITAL_BASED_OUTPATIENT_CLINIC_OR_DEPARTMENT_OTHER): Payer: Medicare Other

## 2021-02-07 ENCOUNTER — Other Ambulatory Visit: Payer: Self-pay

## 2021-02-07 ENCOUNTER — Emergency Department (HOSPITAL_BASED_OUTPATIENT_CLINIC_OR_DEPARTMENT_OTHER)
Admission: EM | Admit: 2021-02-07 | Discharge: 2021-02-07 | Disposition: A | Discharge status: Home / Self Care | Payer: Medicare Other | Attending: Emergency Medicine | Admitting: Emergency Medicine

## 2021-02-07 ENCOUNTER — Encounter (HOSPITAL_BASED_OUTPATIENT_CLINIC_OR_DEPARTMENT_OTHER): Payer: Self-pay | Admitting: Emergency Medicine

## 2021-02-07 DIAGNOSIS — I10 Essential (primary) hypertension: Secondary | ICD-10-CM | POA: Insufficient documentation

## 2021-02-07 DIAGNOSIS — S0101XA Laceration without foreign body of scalp, initial encounter: Secondary | ICD-10-CM | POA: Diagnosis not present

## 2021-02-07 DIAGNOSIS — I482 Chronic atrial fibrillation, unspecified: Secondary | ICD-10-CM | POA: Insufficient documentation

## 2021-02-07 DIAGNOSIS — F0390 Unspecified dementia without behavioral disturbance: Secondary | ICD-10-CM | POA: Diagnosis not present

## 2021-02-07 DIAGNOSIS — S0990XA Unspecified injury of head, initial encounter: Secondary | ICD-10-CM

## 2021-02-07 DIAGNOSIS — W19XXXA Unspecified fall, initial encounter: Secondary | ICD-10-CM

## 2021-02-07 DIAGNOSIS — W06XXXA Fall from bed, initial encounter: Secondary | ICD-10-CM | POA: Insufficient documentation

## 2021-02-07 DIAGNOSIS — S32010A Wedge compression fracture of first lumbar vertebra, initial encounter for closed fracture: Secondary | ICD-10-CM

## 2021-02-07 DIAGNOSIS — M545 Low back pain, unspecified: Secondary | ICD-10-CM | POA: Diagnosis not present

## 2021-02-07 DIAGNOSIS — Z7901 Long term (current) use of anticoagulants: Secondary | ICD-10-CM | POA: Insufficient documentation

## 2021-02-07 DIAGNOSIS — S161XXA Strain of muscle, fascia and tendon at neck level, initial encounter: Secondary | ICD-10-CM

## 2021-02-07 LAB — BASIC METABOLIC PANEL
Anion gap: 7 (ref 5–15)
BUN: 20 mg/dL (ref 8–23)
CO2: 28 mmol/L (ref 22–32)
Calcium: 8.7 mg/dL — ABNORMAL LOW (ref 8.9–10.3)
Chloride: 103 mmol/L (ref 98–111)
Creatinine, Ser: 0.9 mg/dL (ref 0.61–1.24)
GFR, Estimated: >60 mL/min (ref >60)
Glucose, Bld: 103 mg/dL — ABNORMAL HIGH (ref 70–99)
Potassium: 3.5 mmol/L (ref 3.5–5.1)
Sodium: 138 mmol/L (ref 135–145)

## 2021-02-07 LAB — CBC WITH DIFFERENTIAL/PLATELET
Abs Immature Granulocytes: 0.08 10*3/uL — ABNORMAL HIGH (ref 0.00–0.07)
Basophils Absolute: 0 10*3/uL (ref 0.0–0.1)
Basophils Relative: 0 %
Eosinophils Absolute: 0.1 10*3/uL (ref 0.0–0.5)
Eosinophils Relative: 1 %
HCT: 33 % — ABNORMAL LOW (ref 39.0–52.0)
Hemoglobin: 11 g/dL — ABNORMAL LOW (ref 13.0–17.0)
Immature Granulocytes: 1 %
Lymphocytes Relative: 13 %
Lymphs Abs: 1.3 10*3/uL (ref 0.7–4.0)
MCH: 32.9 pg (ref 26.0–34.0)
MCHC: 33.3 g/dL (ref 30.0–36.0)
MCV: 98.8 fL (ref 80.0–100.0)
Monocytes Absolute: 0.5 10*3/uL (ref 0.1–1.0)
Monocytes Relative: 5 %
Neutro Abs: 7.7 10*3/uL (ref 1.7–7.7)
Neutrophils Relative %: 80 %
Platelets: 312 10*3/uL (ref 150–400)
RBC: 3.34 MIL/uL — ABNORMAL LOW (ref 4.22–5.81)
RDW: 16.8 % — ABNORMAL HIGH (ref 11.5–15.5)
WBC: 9.6 10*3/uL (ref 4.0–10.5)
nRBC: 0 % (ref 0.0–0.2)

## 2021-02-07 MED ORDER — LIDOCAINE HCL (PF) 1 % IJ SOLN
5.0000 mL | Freq: Once | INTRAMUSCULAR | Status: AC
  Administered 2021-02-07: 5 mL via INTRADERMAL
  Filled 2021-02-07: qty 5

## 2021-02-07 NOTE — Discharge Instructions (Addendum)
Continue medications as previously prescribed.  Return to the emergency department for difficulty waking, severe headache, seizure activity, or other new and concerning symptoms.

## 2021-02-07 NOTE — ED Notes (Signed)
Pre existing skin tear to anterior left forearm and right lower leg bandaged prior to arrival. Bandages are intact and reinforced. New approx 2cm laceration to back of head. 2 staples placed by ER MD. Wound cleaned and irrigated.

## 2021-02-07 NOTE — ED Triage Notes (Signed)
Reports he fell at [facility].  Has a lac to back of the head and skin tears to both arms.  Brought by ems.

## 2021-02-07 NOTE — ED Provider Notes (Signed)
[facility] EMERGENCY DEPARTMENT Provider Note   CSN: [id number] Arrival date & time: 02/07/21  0022     History Chief Complaint  Patient presents with   Mark Moore is a 87 y.o. male.  Patient is an 87 year old male with past medical history of atrial fibrillation on Pradaxa, hypertension, hyperlipidemia, GERD, dementia.  Patient sent from his extended care facility for evaluation of fall.  He apparently fell attempting to get out of bed and struck the back of his head either on the floor or the nightstand.  He denies loss of consciousness.  He does report some headache and low back pain.  He denies any numbness or tingling.  Patient is Spanish-speaking and history taken with the assistance of a translator present at bedside.  The history is provided by the patient.  Fall This is a new problem. The current episode started less than 1 hour ago. The problem occurs constantly. The problem has not changed since onset.Nothing aggravates the symptoms. Nothing relieves the symptoms.      Past Medical History:  Diagnosis Date   Atrial fibrillation [facility])    Atrial fibrillation, chronic (HCC)    BPH (benign prostatic hyperplasia)    Dementia (HCC)    Depression    DNR (do not resuscitate) 01/17/2021   Dyslipidemia    GERD (gastroesophageal reflux disease)    Glaucoma    Hypertension     Patient Active Problem List   Diagnosis Date Noted   Acute encephalopathy 01/18/2021   Acute metabolic encephalopathy 01/17/2021   Atrial fibrillation, chronic (HCC) 01/17/2021   Fall (on)(from) sidewalk curb, initial encounter 01/17/2021   Dementia (HCC) 01/17/2021   Depression 01/17/2021   Dyslipidemia 01/17/2021   Glaucoma 01/17/2021   Thoracic compression fracture, closed, initial encounter (HCC) 01/17/2021   DNR (do not resuscitate) 01/17/2021   Hyperlipidemia 12/03/2020   Hypertension 12/02/2020   Glaucoma 12/02/2020   Depression 12/02/2020   GERD  (gastroesophageal reflux disease) 12/02/2020   BPH (benign prostatic hyperplasia) 12/02/2020   Atrial fibrillation (HCC) 12/02/2020    Past Surgical History:  Procedure Laterality Date   NO PAST SURGERIES     none         No family history on file.  Social History   Tobacco Use   Smoking status: Never   Smokeless tobacco: Never  Vaping Use   Vaping Use: Never used  Substance Use Topics   Alcohol use: Never   Drug use: Never    Home Medications Prior to Admission medications   Medication Sig Start Date End Date Taking? Authorizing Provider  acetaminophen (TYLENOL) 325 MG tablet Take 2 tablets (650 mg total) by mouth every 6 (six) hours as needed for mild pain (or Fever >/= 101). 01/30/21   Elgergawy, Leana Roe, MD  atorvastatin (LIPITOR) 10 MG tablet Take 1 tablet (10 mg total) by mouth daily. 12/02/20   Wanda Plump, MD  atorvastatin (LIPITOR) 10 MG tablet Take 10 mg by mouth daily.    [provider]  brimonidine (ALPHAGAN) 0.2 % ophthalmic solution Place 1 drop into both eyes 3 (three) times daily. 12/03/20   Wanda Plump, MD  brimonidine (ALPHAGAN) 0.2 % ophthalmic solution Place 1 drop into both eyes 2 (two) times daily.    [provider]  dabigatran (PRADAXA) 150 MG CAPS capsule Take 1 capsule (150 mg total) by mouth 2 (two) times daily. 12/02/20   Wanda Plump, MD  dabigatran (PRADAXA) 150 MG  CAPS capsule Take 150 mg by mouth 2 (two) times daily.    [provider]  diltiazem (CARDIZEM) 30 MG tablet Take 1 tablet (30 mg total) by mouth in the morning and at bedtime. 12/02/20   Wanda Plump, MD  diltiazem (CARDIZEM) 30 MG tablet Take 30 mg by mouth 2 (two) times daily.    [provider]  dorzolamide (TRUSOPT) 2 % ophthalmic solution Place 1 drop into both eyes 2 (two) times daily. 12/03/20   Wanda Plump, MD  dorzolamide (TRUSOPT) 2 % ophthalmic solution Place 1 drop into both eyes 2 (two) times daily.    [provider]  doxycycline  (VIBRA-TABS) 100 MG tablet Take 1 tablet (100 mg total) by mouth 2 (two) times daily. 12/02/20   Wanda Plump, MD  escitalopram (LEXAPRO) 5 MG tablet Take 1 tablet (5 mg total) by mouth at bedtime. 12/31/20   Wanda Plump, MD  finasteride (PROSCAR) 5 MG tablet Take 1 tablet (5 mg total) by mouth daily. 12/02/20   Wanda Plump, MD  finasteride (PROSCAR) 5 MG tablet Take 5 mg by mouth daily.    [provider]  ondansetron (ZOFRAN) 4 MG tablet Take 1 tablet (4 mg total) by mouth every 8 (eight) hours as needed for nausea. 01/30/21   Elgergawy, Leana Roe, MD  pantoprazole (PROTONIX) 40 MG tablet Take 1 tablet (40 mg total) by mouth daily. 01/08/21   Wanda Plump, MD  pantoprazole (PROTONIX) 40 MG tablet Take 40 mg by mouth daily before breakfast.    [provider]  spironolactone (ALDACTONE) 25 MG tablet Take 1 tablet (25 mg total) by mouth daily. 12/02/20   Wanda Plump, MD    Allergies    Wellbutrin [bupropion]  Review of Systems   Review of Systems  All other systems reviewed and are negative.  Physical Exam Updated Vital Signs Ht 5\' 9"  (1.753 m)   Wt 81.6 kg   BMI 26.57 kg/m   Physical Exam Vitals and nursing note reviewed.  Constitutional:      General: He is not in acute distress.    Appearance: He is well-developed. He is not diaphoretic.  HENT:     Head: Normocephalic.     Comments: There is a 1.5 cm laceration noted to the right of the occiput to the posterior scalp.  Bleeding is controlled. Eyes:     Extraocular Movements: Extraocular movements intact.     Pupils: Pupils are equal, round, and reactive to light.  Cardiovascular:     Rate and Rhythm: Normal rate and regular rhythm.     Heart sounds: No murmur heard.   No friction rub.  Pulmonary:     Effort: Pulmonary effort is normal. No respiratory distress.     Breath sounds: Normal breath sounds. No wheezing or rales.  Abdominal:     General: Bowel sounds are normal. There is no distension.     Palpations:  Abdomen is soft.     Tenderness: There is no abdominal tenderness.  Musculoskeletal:        General: Normal range of motion.     Cervical back: Normal range of motion and neck supple.  Skin:    General: Skin is warm and dry.  Neurological:     General: No focal deficit present.     Mental Status: He is alert and oriented to person, place, and time.     Cranial Nerves: No cranial nerve deficit.  Coordination: Coordination normal.     Comments: Patient is awake and alert.  He answers questions appropriately.  He moves all 4 extremities and strength is 5 out of 5 throughout.    ED Results / Procedures / Treatments   Labs (all labs ordered are listed, but only abnormal results are displayed) Labs Reviewed  BASIC METABOLIC PANEL  CBC WITH DIFFERENTIAL/PLATELET    EKG EKG Interpretation  Date/Time:  Sunday February 07 2021 00:34:22 EDT Ventricular Rate:  84 PR Interval:    QRS Duration: 117 QT Interval:  370 QTC Calculation: 438 R Axis:   -50 Text Interpretation: Atrial fibrillation Nonspecific IVCD with LAD Confirmed by Geoffery Lyons ([id number]) on 02/07/2021 12:49:23 AM  Radiology No results found.  Procedures Procedures   Medications Ordered in ED Medications  lidocaine (PF) (XYLOCAINE) 1 % injection 5 mL (has no administration in time range)    ED Course  I have reviewed the triage vital signs and the nursing notes.  Pertinent labs & imaging results that were available during my care of the patient were reviewed by me and considered in my medical decision making (see chart for details).    MDM Rules/Calculators/A&P  Patient brought by EMS for evaluation of fall at his extended care facility.  He has a laceration to the posterior scalp that was repaired with 2 staples.  He appears neurologically intact.  Head CT, cervical spine CT are both unremarkable.  CT scan was obtained of the lumbar spine as well showing what appears to be an acute T1 compression fracture with no  complicating features.  Laboratory studies unremarkable and EKG showing atrial fibrillation with no acute changes.  At this point discharge seems appropriate.  Patient to follow-up for staple removal in 1 week and return if symptoms worsen.  LACERATION REPAIR Performed by: Geoffery Lyons Authorized by: Geoffery Lyons Consent: Verbal consent obtained. Risks and benefits: risks, benefits and alternatives were discussed Consent given by: patient Patient identity confirmed: provided demographic data Prepped and Draped in normal sterile fashion Wound explored  Laceration Location: posterior scalp  Laceration Length: 1.5cm  No Foreign Bodies seen or palpated  Anesthesia: local infiltration  Local anesthetic: lidocaine 1% without epinephrine  Anesthetic total: 3 ml  Irrigation method: syringe Amount of cleaning: standard  Skin closure: staples  Number of sutures: 2  Technique: staples  Patient tolerance: Patient tolerated the procedure well with no immediate complications.   Final Clinical Impression(s) / ED Diagnoses Final diagnoses:  None    Rx / DC Orders ED Discharge Orders     None        Geoffery Lyons, MD 02/07/21 [id number]

## 2021-02-07 NOTE — ED Notes (Signed)
Patient transported to CT 

## 2021-02-07 NOTE — ED Notes (Signed)
[facility] notified for pt transfer to facility  

## 2021-02-19 ENCOUNTER — Other Ambulatory Visit (HOSPITAL_COMMUNITY): Payer: Self-pay

## 2021-02-19 DIAGNOSIS — R1310 Dysphagia, unspecified: Secondary | ICD-10-CM

## 2021-02-24 ENCOUNTER — Encounter (HOSPITAL_COMMUNITY): Payer: Medicare (Managed Care)

## 2021-02-24 ENCOUNTER — Ambulatory Visit (HOSPITAL_COMMUNITY): Payer: Medicare (Managed Care)

## 2021-02-24 ENCOUNTER — Encounter (HOSPITAL_COMMUNITY): Payer: Self-pay

## 2021-03-01 ENCOUNTER — Other Ambulatory Visit: Payer: Self-pay | Admitting: Internal Medicine

## 2021-03-09 ENCOUNTER — Emergency Department (HOSPITAL_COMMUNITY): Payer: Medicare Other

## 2021-03-09 ENCOUNTER — Observation Stay (HOSPITAL_COMMUNITY)
Admission: EM | Admit: 2021-03-09 | Discharge: 2021-03-12 | Disposition: A | Discharge status: Home / Self Care | Payer: Medicare Other | Attending: Family Medicine | Admitting: Family Medicine

## 2021-03-09 ENCOUNTER — Other Ambulatory Visit (HOSPITAL_COMMUNITY): Payer: Medicare Other

## 2021-03-09 ENCOUNTER — Encounter (HOSPITAL_COMMUNITY): Payer: Self-pay | Admitting: Emergency Medicine

## 2021-03-09 ENCOUNTER — Observation Stay (HOSPITAL_COMMUNITY): Payer: Medicare Other

## 2021-03-09 ENCOUNTER — Other Ambulatory Visit: Payer: Self-pay | Admitting: Internal Medicine

## 2021-03-09 DIAGNOSIS — M6281 Muscle weakness (generalized): Secondary | ICD-10-CM | POA: Diagnosis not present

## 2021-03-09 DIAGNOSIS — Z1612 Extended spectrum beta lactamase (ESBL) resistance: Secondary | ICD-10-CM

## 2021-03-09 DIAGNOSIS — I4891 Unspecified atrial fibrillation: Secondary | ICD-10-CM | POA: Insufficient documentation

## 2021-03-09 DIAGNOSIS — A499 Bacterial infection, unspecified: Secondary | ICD-10-CM

## 2021-03-09 DIAGNOSIS — I10 Essential (primary) hypertension: Secondary | ICD-10-CM | POA: Diagnosis not present

## 2021-03-09 DIAGNOSIS — R4182 Altered mental status, unspecified: Secondary | ICD-10-CM | POA: Diagnosis not present

## 2021-03-09 DIAGNOSIS — F0390 Unspecified dementia without behavioral disturbance: Secondary | ICD-10-CM | POA: Diagnosis not present

## 2021-03-09 DIAGNOSIS — Z79899 Other long term (current) drug therapy: Secondary | ICD-10-CM | POA: Insufficient documentation

## 2021-03-09 DIAGNOSIS — N390 Urinary tract infection, site not specified: Secondary | ICD-10-CM

## 2021-03-09 DIAGNOSIS — R1030 Lower abdominal pain, unspecified: Secondary | ICD-10-CM | POA: Diagnosis not present

## 2021-03-09 DIAGNOSIS — R1032 Left lower quadrant pain: Secondary | ICD-10-CM

## 2021-03-09 DIAGNOSIS — K4090 Unilateral inguinal hernia, without obstruction or gangrene, not specified as recurrent: Secondary | ICD-10-CM

## 2021-03-09 DIAGNOSIS — R319 Hematuria, unspecified: Secondary | ICD-10-CM | POA: Diagnosis not present

## 2021-03-09 DIAGNOSIS — K4031 Unilateral inguinal hernia, with obstruction, without gangrene, recurrent: Secondary | ICD-10-CM | POA: Insufficient documentation

## 2021-03-09 DIAGNOSIS — Z20822 Contact with and (suspected) exposure to covid-19: Secondary | ICD-10-CM | POA: Insufficient documentation

## 2021-03-09 LAB — COMPREHENSIVE METABOLIC PANEL
ALT: 11 U/L (ref 0–44)
AST: 13 U/L — ABNORMAL LOW (ref 15–41)
Albumin: 3.5 g/dL (ref 3.5–5.0)
Alkaline Phosphatase: 84 U/L (ref 38–126)
Anion gap: 7 (ref 5–15)
BUN: 24 mg/dL — ABNORMAL HIGH (ref 8–23)
CO2: 24 mmol/L (ref 22–32)
Calcium: 9.6 mg/dL (ref 8.9–10.3)
Chloride: 106 mmol/L (ref 98–111)
Creatinine, Ser: 0.94 mg/dL (ref 0.61–1.24)
GFR, Estimated: >60 mL/min (ref >60)
Glucose, Bld: 84 mg/dL (ref 70–99)
Potassium: 3.5 mmol/L (ref 3.5–5.1)
Sodium: 137 mmol/L (ref 135–145)
Total Bilirubin: 0.3 mg/dL (ref 0.3–1.2)
Total Protein: 6.1 g/dL — ABNORMAL LOW (ref 6.5–8.1)

## 2021-03-09 LAB — CBC
HCT: 37 % — ABNORMAL LOW (ref 39.0–52.0)
Hemoglobin: 11.8 g/dL — ABNORMAL LOW (ref 13.0–17.0)
MCH: 32.7 pg (ref 26.0–34.0)
MCHC: 31.9 g/dL (ref 30.0–36.0)
MCV: 102.5 fL — ABNORMAL HIGH (ref 80.0–100.0)
Platelets: 309 10*3/uL (ref 150–400)
RBC: 3.61 MIL/uL — ABNORMAL LOW (ref 4.22–5.81)
RDW: 15.9 % — ABNORMAL HIGH (ref 11.5–15.5)
WBC: 5.1 10*3/uL (ref 4.0–10.5)
nRBC: 0 % (ref 0.0–0.2)

## 2021-03-09 LAB — URINALYSIS, ROUTINE W REFLEX MICROSCOPIC
Bacteria, UA: NONE SEEN
Bilirubin Urine: NEGATIVE
Glucose, UA: NEGATIVE mg/dL
Ketones, ur: NEGATIVE mg/dL
Nitrite: NEGATIVE
Protein, ur: NEGATIVE mg/dL
Specific Gravity, Urine: 1.023 (ref 1.005–1.030)
pH: 5 (ref 5.0–8.0)

## 2021-03-09 LAB — VITAMIN B12: Vitamin B-12: 205 pg/mL (ref 180–914)

## 2021-03-09 LAB — SARS CORONAVIRUS 2 (TAT 6-24 HRS): SARS Coronavirus 2: NEGATIVE

## 2021-03-09 LAB — TSH: TSH: 1.024 u[IU]/mL (ref 0.350–4.500)

## 2021-03-09 MED ORDER — DABIGATRAN ETEXILATE MESYLATE 150 MG PO CAPS
150.0000 mg | ORAL_CAPSULE | Freq: Two times a day (BID) | ORAL | Status: DC
  Administered 2021-03-10 – 2021-03-12 (×6): 150 mg via ORAL
  Filled 2021-03-09 (×8): qty 1

## 2021-03-09 MED ORDER — SODIUM CHLORIDE 0.9 % IV SOLN
1.0000 g | Freq: Two times a day (BID) | INTRAVENOUS | Status: DC
  Administered 2021-03-09 – 2021-03-10 (×3): 1 g via INTRAVENOUS
  Filled 2021-03-09 (×4): qty 1

## 2021-03-09 MED ORDER — BRIMONIDINE TARTRATE 0.2 % OP SOLN
1.0000 [drp] | Freq: Three times a day (TID) | OPHTHALMIC | Status: DC
  Administered 2021-03-09 – 2021-03-12 (×7): 1 [drp] via OPHTHALMIC
  Filled 2021-03-09: qty 5

## 2021-03-09 MED ORDER — ACETAMINOPHEN 325 MG PO TABS: 650.0000 mg | ORAL_TABLET | Freq: Four times a day (QID) | ORAL | Status: DC | PRN

## 2021-03-09 MED ORDER — PANTOPRAZOLE SODIUM 40 MG PO TBEC
40.0000 mg | DELAYED_RELEASE_TABLET | Freq: Every day | ORAL | Status: DC
  Administered 2021-03-10 – 2021-03-12 (×3): 40 mg via ORAL
  Filled 2021-03-09 (×3): qty 1

## 2021-03-09 MED ORDER — ATORVASTATIN CALCIUM 10 MG PO TABS
10.0000 mg | ORAL_TABLET | Freq: Every day | ORAL | Status: DC
  Administered 2021-03-10 – 2021-03-12 (×3): 10 mg via ORAL
  Filled 2021-03-09 (×3): qty 1

## 2021-03-09 MED ORDER — DILTIAZEM HCL 30 MG PO TABS
30.0000 mg | ORAL_TABLET | Freq: Two times a day (BID) | ORAL | Status: DC
  Administered 2021-03-09 – 2021-03-12 (×6): 30 mg via ORAL
  Filled 2021-03-09 (×7): qty 1

## 2021-03-09 MED ORDER — DORZOLAMIDE HCL 2 % OP SOLN
1.0000 [drp] | Freq: Two times a day (BID) | OPHTHALMIC | Status: DC
  Administered 2021-03-10 – 2021-03-12 (×6): 1 [drp] via OPHTHALMIC
  Filled 2021-03-09 (×2): qty 10

## 2021-03-09 MED ORDER — FINASTERIDE 5 MG PO TABS
5.0000 mg | ORAL_TABLET | Freq: Every day | ORAL | Status: DC
  Administered 2021-03-10 – 2021-03-12 (×3): 5 mg via ORAL
  Filled 2021-03-09 (×3): qty 1

## 2021-03-09 MED ORDER — SPIRONOLACTONE 25 MG PO TABS
25.0000 mg | ORAL_TABLET | Freq: Every day | ORAL | Status: DC
  Administered 2021-03-10 – 2021-03-12 (×3): 25 mg via ORAL
  Filled 2021-03-09 (×3): qty 1

## 2021-03-09 NOTE — ED Provider Notes (Signed)
[facility] EMERGENCY DEPARTMENT Provider Note   CSN: [id number] Arrival date & time: 03/09/21  1106     History Chief Complaint  Patient presents with   Altered Mental Status    Mark Moore is a 87 y.o. male.  87 year old male with history of A. fib and dementia brought in by EMS from [facility] living facility for altered mental status.  Patient states he is unsure why he is in the hospital.  Denies any pain or fever.  Denies pain with urination.  Spoke with senior living facility staff member for further information.  She states that he has been confused since last Friday, 5 days ago.  He is normally alert and oriented x3.  He was found wandering outside of the facility so they tested his urine and found out that he had ESBL in his urine the following day and was started on fosfomycin.  Staff number states that he was doing better for a few days but then for the past 2 days, he had continued confusion and had been wandering in and out of his room though he has been on contact precautions (which he was initially compliant with).  She states he has a history of ESBL UTI causing confusion which did not resolve with oral antibiotics and had to be hospitalized for IV antibiotics.  The history is provided by the nursing home, a relative and the patient. A language interpreter was used.  Altered Mental Status Presenting symptoms: confusion   Most recent episode:  More than 2 days ago Episode history:  Single Duration:  5 days Timing:  Constant Progression:  Worsening Chronicity:  New Context: recent infection       Past Medical History:  Diagnosis Date   Atrial fibrillation (HCC)    Atrial fibrillation, chronic (HCC)    BPH (benign prostatic hyperplasia)    Dementia (HCC)    Depression    DNR (do not resuscitate) 01/17/2021   Dyslipidemia    GERD (gastroesophageal reflux disease)    Glaucoma    Hypertension     Patient Active Problem List    Diagnosis Date Noted   ESBL (extended spectrum beta-lactamase) producing bacteria infection 03/09/2021   Acute encephalopathy 01/18/2021   Acute metabolic encephalopathy 01/17/2021   Atrial fibrillation, chronic (HCC) 01/17/2021   Fall (on)(from) sidewalk curb, initial encounter 01/17/2021   Dementia (HCC) 01/17/2021   Depression 01/17/2021   Dyslipidemia 01/17/2021   Glaucoma 01/17/2021   Thoracic compression fracture, closed, initial encounter (HCC) 01/17/2021   DNR (do not resuscitate) 01/17/2021   Hyperlipidemia 12/03/2020   Hypertension 12/02/2020   Glaucoma 12/02/2020   Depression 12/02/2020   GERD (gastroesophageal reflux disease) 12/02/2020   BPH (benign prostatic hyperplasia) 12/02/2020   Atrial fibrillation (HCC) 12/02/2020    Past Surgical History:  Procedure Laterality Date   NO PAST SURGERIES     none         No family history on file.  Social History   Tobacco Use   Smoking status: Never   Smokeless tobacco: Never  Vaping Use   Vaping Use: Never used  Substance Use Topics   Alcohol use: Never   Drug use: Never    Home Medications Prior to Admission medications   Medication Sig Start Date End Date Taking? Authorizing Provider  acetaminophen (TYLENOL) 500 MG tablet Take 1,000 mg by mouth 3 (three) times daily.   Yes [provider]  atorvastatin (LIPITOR) 10 MG tablet Take 1 tablet (10 mg  total) by mouth daily. 12/02/20  Yes Paz, Nolon Rod, MD  brimonidine (ALPHAGAN) 0.2 % ophthalmic solution Place 1 drop into both eyes 3 (three) times daily. Patient taking differently: Place 1 drop into both eyes 2 (two) times daily. 12/03/20  Yes Wanda Plump, MD  dabigatran (PRADAXA) 150 MG CAPS capsule Take 1 capsule (150 mg total) by mouth 2 (two) times daily. 12/02/20  Yes Paz, Nolon Rod, MD  diltiazem (CARDIZEM) 30 MG tablet Take 1 tablet (30 mg total) by mouth in the morning and at bedtime. 12/02/20  Yes Paz, Nolon Rod, MD  dorzolamide (TRUSOPT) 2 % ophthalmic solution  Place 1 drop into both eyes 2 (two) times daily. 12/03/20  Yes Paz, Nolon Rod, MD  finasteride (PROSCAR) 5 MG tablet Take 1 tablet (5 mg total) by mouth daily. 12/02/20  Yes Paz, Nolon Rod, MD  fosfomycin (MONUROL) 3 g PACK Take 3 g by mouth as directed. 1 packet mixed with cool water by mouth on 03/07/2021, 03/10/2021, 03/13/2021   Yes [provider]  pantoprazole (PROTONIX) 40 MG tablet Take 1 tablet (40 mg total) by mouth daily. 01/08/21  Yes Paz, Nolon Rod, MD  spironolactone (ALDACTONE) 25 MG tablet Take 1 tablet (25 mg total) by mouth daily. 03/01/21  Yes Paz, Nolon Rod, MD  acetaminophen (TYLENOL) 325 MG tablet Take 2 tablets (650 mg total) by mouth every 6 (six) hours as needed for mild pain (or Fever >/= 101). Patient not taking: No sig reported 01/30/21   Elgergawy, Leana Roe, MD  doxycycline (VIBRA-TABS) 100 MG tablet Take 1 tablet (100 mg total) by mouth 2 (two) times daily. Patient not taking: No sig reported 12/02/20   Wanda Plump, MD  escitalopram (LEXAPRO) 5 MG tablet Take 1 tablet (5 mg total) by mouth at bedtime. Patient not taking: No sig reported 12/31/20   Wanda Plump, MD  ondansetron (ZOFRAN) 4 MG tablet Take 1 tablet (4 mg total) by mouth every 8 (eight) hours as needed for nausea. Patient not taking: No sig reported 01/30/21   Elgergawy, Leana Roe, MD    Allergies    Wellbutrin [bupropion]  Review of Systems   Review of Systems  Unable to perform ROS: Mental status change  Psychiatric/Behavioral:  Positive for confusion.    Physical Exam Updated Vital Signs BP 128/79   Pulse 80   Temp 98.2 F (36.8 C) (Oral)   Resp 17   Ht 5\' 9"  (1.753 m)   Wt 81 kg   SpO2 100%   BMI 26.37 kg/m   Physical Exam Vitals and nursing note reviewed.  Constitutional:      Appearance: He is well-developed.  HENT:     Head: Normocephalic and atraumatic.     Mouth/Throat:     Mouth: Mucous membranes are moist.     Pharynx: Oropharynx is clear.  Eyes:     Extraocular Movements: Extraocular  movements intact.     Conjunctiva/sclera: Conjunctivae normal.     Pupils: Pupils are equal, round, and reactive to light.  Cardiovascular:     Rate and Rhythm: Normal rate and regular rhythm.     Heart sounds: No murmur heard. Pulmonary:     Effort: Pulmonary effort is normal. No respiratory distress.     Breath sounds: Normal breath sounds.  Abdominal:     Palpations: Abdomen is soft.     Tenderness: There is no abdominal tenderness.  Musculoskeletal:     Cervical back: Neck supple.  Skin:  General: Skin is warm and dry.  Neurological:     General: No focal deficit present.     Mental Status: He is alert.     Cranial Nerves: No cranial nerve deficit.     Comments: Alert, oriented to self, place and knows it is the beginning of August but does not know the year.  Follows commands appropriately.  Conversant but perseverative stating it is end of July or early August.  Full strength to bilateral upper and lower extremities.    ED Results / Procedures / Treatments   Labs (all labs ordered are listed, but only abnormal results are displayed) Labs Reviewed  COMPREHENSIVE METABOLIC PANEL - Abnormal; Notable for the following components:      Result Value   BUN 24 (*)    Total Protein 6.1 (*)    AST 13 (*)    All other components within normal limits  CBC - Abnormal; Notable for the following components:   RBC 3.61 (*)    Hemoglobin 11.8 (*)    HCT 37.0 (*)    MCV 102.5 (*)    RDW 15.9 (*)    All other components within normal limits  URINALYSIS, ROUTINE W REFLEX MICROSCOPIC - Abnormal; Notable for the following components:   APPearance HAZY (*)    Hgb urine dipstick MODERATE (*)    Leukocytes,Ua TRACE (*)    All other components within normal limits  CULTURE, BLOOD (ROUTINE X 2)  CULTURE, BLOOD (ROUTINE X 2)  URINE CULTURE  SARS CORONAVIRUS 2 (TAT 6-24 HRS)  TSH  VITAMIN B12    EKG None  Radiology CT HEAD WO CONTRAST ( )  Result Date: 03/09/2021 CLINICAL DATA:   Headache, intracranial hemorrhage suspected Delirium EXAM: CT HEAD WITHOUT CONTRAST TECHNIQUE: Contiguous axial images were obtained from the base of the skull through the vertex without intravenous contrast. COMPARISON:  02/07/2021 FINDINGS: Brain: No evidence of acute infarction, hemorrhage, hydrocephalus, extra-axial collection or mass lesion/mass effect. Moderate low-density changes within the periventricular and subcortical white matter compatible with chronic microvascular ischemic change. Mild diffuse cerebral volume loss. Vascular: Atherosclerotic calcifications involving the large vessels of the skull base. No unexpected hyperdense vessel. Skull: Normal. Negative for fracture or focal lesion. Sinuses/Orbits: Left mastoid effusion. Mucosal thickening within the sphenoid sinuses. Other: Incidentally noted densely calcified central maxillary torus measuring 1.5 x 1.3 cm. IMPRESSION: 1. No acute intracranial findings. 2. Chronic microvascular ischemic change and cerebral volume loss. 3. Left mastoid effusion. Electronically Signed   By: Duanne Guess D.O.   On: 03/09/2021 15:10    Procedures Procedures   Medications Ordered in ED Medications  meropenem (MERREM) 1 g in sodium chloride 0.9 % 100 mL IVPB (0 g Intravenous Stopped 03/09/21 1413)  atorvastatin (LIPITOR) tablet 10 mg (has no administration in time range)  diltiazem (CARDIZEM) tablet 30 mg (has no administration in time range)  spironolactone (ALDACTONE) tablet 25 mg (has no administration in time range)  pantoprazole (PROTONIX) EC tablet 40 mg (has no administration in time range)  finasteride (PROSCAR) tablet 5 mg (has no administration in time range)  dabigatran (PRADAXA) capsule 150 mg (has no administration in time range)  brimonidine (ALPHAGAN) 0.2 % ophthalmic solution 1 drop (has no administration in time range)  dorzolamide (TRUSOPT) 2 % ophthalmic solution 1 drop (has no administration in time range)    ED Course  I have  reviewed the triage vital signs and the nursing notes.  Pertinent labs & imaging results that were available during my  care of the patient were reviewed by me and considered in my medical decision making (see chart for details).    MDM Rules/Calculators/A&P                         87 year old male with history of dementia presenting with altered mental status in the setting of diagnosed ESBL Klebsiella UTI.  Patient with mild confusion and difficulty orienting to time but otherwise is able to follow commands.  Reportedly alert and oriented x3 at baseline.  Will obtain labs, UA, urine culture, and blood cultures.  We will also obtain CT head.  CMP unremarkable, CBC with mild anemia with hemoglobin 11.8 which is not acute, otherwise unremarkable.  UA with trace leukocytes, negative nitrites.  Upon speaking with living facility staff member, it appears he was already started on fosfomycin and is still having confusion so will need admission for IV antibiotics.  Meropenem per pharmacy ordered.  Spoke with Dorothyann GibbsBailey Sanford, MD with [facility], will accept patient for admission.  CT head still pending at this time.   Final Clinical Impression(s) / ED Diagnoses Final diagnoses:  Altered mental status, unspecified altered mental status type  Lower urinary tract infectious disease  Groin pain, left  Hernia, inguinal, left    Rx / DC Orders ED Discharge Orders     None        Littie DeedsSun, Lissie Hinesley, MD 03/09/21 1550    Milagros Lollykstra, Zykia Walla S, MD 03/10/21 [phone]

## 2021-03-09 NOTE — ED Notes (Signed)
Attempted to call report. Floor RN states that patient cannot come to their floor with Q2 neuro checks. Messaged provider to see if it could be changed to Q4.

## 2021-03-09 NOTE — ED Triage Notes (Signed)
Pt arrives via EMS from [facility] [place] with AMS since yesterday. EMS reports pt has been getting abx for klebsiella in [facility].

## 2021-03-09 NOTE — Progress Notes (Signed)
Pharmacy Antibiotic Note  Maximino Cozzolino is a 87 y.o. male admitted on 03/09/2021 presenting from facility with AMS, tx for ESBL kleb outpt.  Pharmacy has been consulted for Merrem dosing.  Plan: Merrem 1g IV q 12 hours Monitor renal function, UCx and LOT   Height: 5\' 9"  (175.3 cm) Weight: 81 kg (178 lb 9.2 oz) IBW/kg (Calculated) : 70.7  Temp (24hrs), Avg:98.2 F (36.8 C), Min:98.2 F (36.8 C), Max:98.2 F (36.8 C)  Recent Labs  Lab 03/09/21 1121  WBC 5.1  CREATININE 0.94    Estimated Creatinine Clearance: 53.3 mL/min (by C-G formula based on SCr of 0.94 mg/dL).    Allergies  Allergen Reactions   Wellbutrin [Bupropion] Other (See Comments)    XL 150 mg- Made the patient feel paranoid and have borderline hallucinations    05/09/21, PharmD Clinical Pharmacist ED Pharmacist Phone # [phone] 03/09/2021 1:06 PM

## 2021-03-09 NOTE — H&P (Addendum)
Family Medicine Teaching [facility] Admission History and Physical Service Pager: [phone]  Patient name: Mark Moore Medical record number: [id number] Date of birth: 12-07-1928 Age: 87 y.o. Gender: male  Primary Care Provider: Uvaldo Bristle, NP Consultants: None Code Status: DNR Preferred Emergency Contact:  Contact Information     Name Relation Home Work Mobile   Artist Pais Daughter   [phone]      In-person Spanish interpreter, Bonnye Fava.  Chief Complaint: AMS  Assessment and Plan: Mark Moore is a 87 y.o. male presenting from an extended care facility with altered mental status. PMH is significant for reported dementia, A fib on Pradaxa, hypertension, hyperlipidemia, GERD.   Altered Mental Status of Unknown Etiology in the Setting of Known ESBL Klebsiella Infection Patient BIP EMS after being found to be confused and wandering at his facility.  He was recently started on fosfomycin at his extended care facility for ESBL UTI.  He was previously hospitalized for the same and required treatment with IV meropenem.   On arrival vital signs were stable, BP 117/87.  Satting 97% on room air.  Admission labs revealed no leukocytosis (WBC 5.1), and no AKI (0.94). Curiously UA obtained via in and out cath with trace leukocytes but no bacteria.  Blood and urine cultures were obtained in the ED.  Admission EKG revealed rate controlled atrial fibrillation. CT head with no acute intracranial findings, chronic chronic vascular ischemic changes and cerebral volume loss, left mastoid effusion. Given the similarity to his presentation last admission, this ESBL UTI is the most likely source of his AMS. Consider that his UA here may not grow any bacteria as he has been on fosfomycin.  Other infectious etiologies were considered but he has no wounds and no respiratory symptoms to suggest pneumonia.  Considered mastoiditis given mastoid effusion noted on CT but area was  nontender on exam.  Also consider that this may represent progression of his dementia, though this would not fit with the seemingly acute onset of symptoms. - Admit to med-surg, Dr. Lum Babe attending - Meropenem per pharmacy - Follow-up blood and urine cx -Evaluate for reversible causes of AMS: TSH, B12, RPR, HIV -Contact precautions for ESBL -OT/PT -Neurochecks every 2 hours  Dementia  Depression Patient has some dementia at baseline, but is able to perform his ADLs independently.  Has had issues with sundowning in past hospitalizations, received Haldol intermittently before being started on nightly Seroquel. - Delirium precautions  - Fall precautions - Low threshold to start qhs Seroquel if he becomes agitated at night  Atrial Fibrillation on Pradaxa Rate controlled today. In A Fib on admission EKG.  - Continue diltiazem 30mg  BID - Continue Pradaxa 150mg  BID  Hip Pain ? Left Inguinal Hernia Patient's caretaker at says that Mark Moore has been complaining of groin pain for several weeks and has had difficulty sitting on hard surfaces.  She reports some concern that he may have a left-sided inguinal hernia. -Pelvic ultrasound to evaluate  Hematuria UA obtained in the ED with 21-50 RBCs per high-powered field.  Consider that this could represent traumatic cath versus true microscopic hematuria that could be caused by advanced cystitis versus involvement of the bladder in inguinal hernia as discussed above. -F/u Ucx -Recommend repeat outpatient UA; may need nephrology/urology referral outpatient  Hypertension: chronic, stable BP well controlled today.  117/87 on admission. - Continue spironolactone 25mg  daily   Glaucoma: chronic, stable -Continue Alphagan, dorzolamide  Hyperlipidemia: chronic, stable - Continue Atorvastatin 10mg  daily  GERD: chronic, stable - Continue Protonix 40mg  daily  BPH: chronic, stable - Continue finasteride 5mg  daily  FEN/GI:  Heart healthy diet Prophylaxis: On full dose dabigatran  Disposition: Med telemetry  History of Present Illness:  Mark Moore is a 87 y.o. male presenting from his ALF for altered mental status. Patient able to provide only limited history, majority of history obtained from his daughter Era Bumpers and caregiver 89.  He was in his usual state of health at his assisted living facility until 6 days ago when he began acting more confused and asking more questions than usual.  He typically knows which medications he takes and plan and apparently began asking his caregiver to administer medications that he had already taken earlier in the day.  Given his history of recent hospitalization for UTI and altered mental status, she became concerned that he may have a recurrent UTI and obtained a urine on 8/3.  Urine culture resulted on 8/6 showing ESBL Klebsiella, the same organism that he grew at his last hospitalization. The physician at his facility initiated therapy with fosfomycin.  However today she noticed him wandering the halls and generally not acting like himself and called EMS to bring him to the hospital. He did not ever exhibit frank urinary symptoms.  Denied dysuria, flank pain, suprapubic pain. No fevers.  When asked about chills says "he gets cold at night". His only complaint now is difficulty getting comfortable in bed.  He claims that he has hip pain with doing this.  His caregiver 10/3 reports that she suspects a inguinal hernia as he has had difficulty sitting down recently and has discussed groin pain with her. He also endorses some intermittent cough but no sore throat or other infectious symptoms.  Denies shortness of breath, chest pain.  No recent falls.     Review Of Systems: Per HPI with the following additions:   Review of Systems  Constitutional:  Positive for chills. Negative for fever.  HENT:  Negative for sore throat.   Respiratory:  Positive for cough. Negative  for shortness of breath.   Cardiovascular:  Negative for chest pain and leg swelling.  Genitourinary:  Negative for difficulty urinating and dysuria.  Musculoskeletal:  Positive for arthralgias and back pain.  Neurological:  Negative for speech difficulty and headaches.  Psychiatric/Behavioral:  Positive for behavioral problems and confusion.     Patient Active Problem List   Diagnosis Date Noted   ESBL (extended spectrum beta-lactamase) producing bacteria infection 03/09/2021   Acute encephalopathy 01/18/2021   Acute metabolic encephalopathy 01/17/2021   Atrial fibrillation, chronic (HCC) 01/17/2021   Fall (on)(from) sidewalk curb, initial encounter 01/17/2021   Dementia (HCC) 01/17/2021   Depression 01/17/2021   Dyslipidemia 01/17/2021   Glaucoma 01/17/2021   Thoracic compression fracture, closed, initial encounter (HCC) 01/17/2021   DNR (do not resuscitate) 01/17/2021   Hyperlipidemia 12/03/2020   Hypertension 12/02/2020   Glaucoma 12/02/2020   Depression 12/02/2020   GERD (gastroesophageal reflux disease) 12/02/2020   BPH (benign prostatic hyperplasia) 12/02/2020   Atrial fibrillation (HCC) 12/02/2020    Past Medical History: Past Medical History:  Diagnosis Date   Atrial fibrillation (HCC)    Atrial fibrillation, chronic (HCC)    BPH (benign prostatic hyperplasia)    Dementia (HCC)    Depression    DNR (do not resuscitate) 01/17/2021   Dyslipidemia    GERD (gastroesophageal reflux disease)    Glaucoma    Hypertension     Past Surgical History: Past Surgical  History:  Procedure Laterality Date   NO PAST SURGERIES     none      Social History: Social History   Tobacco Use   Smoking status: Never   Smokeless tobacco: Never  Vaping Use   Vaping Use: Never used  Substance Use Topics   Alcohol use: Never   Drug use: Never   Additional social history: Please also refer to relevant sections of EMR.  Family History: No family history on  file.   Allergies and Medications: Allergies  Allergen Reactions   Wellbutrin [Bupropion] Other (See Comments)    XL 150 mg- Made the patient feel paranoid and have borderline hallucinations   No current facility-administered medications on file prior to encounter.   Current Outpatient Medications on File Prior to Encounter  Medication Sig Dispense Refill   acetaminophen (TYLENOL) 500 MG tablet Take 1,000 mg by mouth 3 (three) times daily.     atorvastatin (LIPITOR) 10 MG tablet Take 1 tablet (10 mg total) by mouth daily. 90 tablet 0   brimonidine (ALPHAGAN) 0.2 % ophthalmic solution Place 1 drop into both eyes 3 (three) times daily. (Patient taking differently: Place 1 drop into both eyes 2 (two) times daily.) 10 mL 0   dabigatran (PRADAXA) 150 MG CAPS capsule Take 1 capsule (150 mg total) by mouth 2 (two) times daily. 180 capsule 0   diltiazem (CARDIZEM) 30 MG tablet Take 1 tablet (30 mg total) by mouth in the morning and at bedtime. 90 tablet 0   dorzolamide (TRUSOPT) 2 % ophthalmic solution Place 1 drop into both eyes 2 (two) times daily. 10 mL 0   finasteride (PROSCAR) 5 MG tablet Take 1 tablet (5 mg total) by mouth daily. 90 tablet 0   fosfomycin (MONUROL) 3 g PACK Take 3 g by mouth as directed. 1 packet mixed with cool water by mouth on 03/07/2021, 03/10/2021, 03/13/2021     pantoprazole (PROTONIX) 40 MG tablet Take 1 tablet (40 mg total) by mouth daily. 90 tablet 1   spironolactone (ALDACTONE) 25 MG tablet Take 1 tablet (25 mg total) by mouth daily. 90 tablet 0   acetaminophen (TYLENOL) 325 MG tablet Take 2 tablets (650 mg total) by mouth every 6 (six) hours as needed for mild pain (or Fever >/= 101). (Patient not taking: No sig reported)     doxycycline (VIBRA-TABS) 100 MG tablet Take 1 tablet (100 mg total) by mouth 2 (two) times daily. (Patient not taking: No sig reported) 14 tablet 0   escitalopram (LEXAPRO) 5 MG tablet Take 1 tablet (5 mg total) by mouth at bedtime. (Patient not  taking: No sig reported) 30 tablet 1   ondansetron (ZOFRAN) 4 MG tablet Take 1 tablet (4 mg total) by mouth every 8 (eight) hours as needed for nausea. (Patient not taking: No sig reported) 20 tablet 0    Objective: BP (!) 144/87   Pulse 85   Temp 98.2 F (36.8 C) (Oral)   Resp 16   Ht 5\' 9"  (1.753 m)   Wt 81 kg   SpO2 98%   BMI 26.37 kg/m  Exam: General: Elderly male, appears stated age, lying in ED stretcher Head: Atraumatic, scalp nontender Eyes: Sclerae anicteric, EOM's intact, PERRLA ENTM: Mucous membranes moist, palate rise symmetrical, good dentition, no periauricular lymphadenopathy Neck: Supple, no cervical or mandibular lymphadenopathy, no thyromegaly Cardiovascular: Irregularly irregular, no murmur Respiratory: Normal work of breathing on room air, lung fields clear to auscultation anteriorly Gastrointestinal: Soft, nontender, no distention.  No  CVA tenderness MSK: Iliac crest tender to palpation bilaterally Derm: Venous stasis changes of bilateral ankles Neuro: Alert, no focal deficits, moves all extremities independently Psych: Mood and affect are appropriate  Labs and Imaging: CBC BMET  Recent Labs  Lab 03/09/21 1121  WBC 5.1  HGB 11.8*  HCT 37.0*  PLT 309   Recent Labs  Lab 03/09/21 1121  NA 137  K 3.5  CL 106  CO2 24  BUN 24*  CREATININE 0.94  GLUCOSE 84  CALCIUM 9.6     EKG: Rate controlled atrial fibrillation  CT Head  IMPRESSION: 1. No acute intracranial findings. 2. Chronic microvascular ischemic change and cerebral volume loss. 3. Left mastoid effusion.    Alicia AmelSanford, James B, MD 03/09/2021, 3:29 PM PGY-1, [facility] Family Medicine FPTS Intern pager: [phone], text pages welcome  FPTS Upper-Level Resident Addendum   I have independently interviewed and examined the patient. I have discussed the above with the original author and agree with their documentation. My edits for correction/addition/clarification are included where  appropriate. Please see also any attending notes.   Sabino DickAlejandra Tijuan Dantes, DO PGY-2, [facility] 03/09/2021 4:37 PM  FPTS Service pager: [phone] (text pages welcome through [employer])

## 2021-03-09 NOTE — Hospital Course (Addendum)
Mark Moore is an 87 yo male who presented from his extended care facility with altered mental status. PMH is significant for dementia, A fib on Pradaxa, hypertension, hyperlipidemia, GERD. Below is his brief hospital course outlined by problem. For additional information, please refer to the H&P.   AMS, Dementia  ESBL Klebsiella Infection Patient had recently been started on fosfomycin at his extended care facility for ESBL UTI. In ED vitals stable, labs without leukocytosis. UA with trace leukocytes but no bacteria. Blood and urine cultures obtained and patient started on IV meropenem. Given his AMS, CT head was obtained and negative for acute findings. Reversible causes of AMS also evaluated for and TSH, B12, HIV, RPR were all normal/negative. On hospital day 2, his mental status had returned to baseline. He was no longer confused or agitated and answered all questions appropriately.  His urine culture obtained in the ED did not grow any bacterial colonies, suggesting successful treatment of his UTI and IV meropenem was discontinued. He remained stable and afebrile throughout admission.   Atrial Fibrillation Patient was in rhythm controlled atrial fibrillation on admission.  He was continued on his home diltiazem and Pradaxa and monitored on cardiac telemetry throughout hospitalization.  Hip Pain Patient c/o hip pain. In discussion with caretaker, it was noted that there was concern for possible inguinal hernia. Patient had pelvic ultrasound for further evaluation and this was negative for bowel containing hernia.  Other conditions chronic and stable.  Items for PCP follow-up: 1) He had some microscopic hematuria on admission UA. Recommend follow-up outpatient UA

## 2021-03-09 NOTE — ED Notes (Signed)
Patient transported to CT 

## 2021-03-10 DIAGNOSIS — R4182 Altered mental status, unspecified: Secondary | ICD-10-CM | POA: Diagnosis not present

## 2021-03-10 DIAGNOSIS — A499 Bacterial infection, unspecified: Secondary | ICD-10-CM | POA: Diagnosis not present

## 2021-03-10 DIAGNOSIS — Z1612 Extended spectrum beta lactamase (ESBL) resistance: Secondary | ICD-10-CM | POA: Diagnosis not present

## 2021-03-10 LAB — CBC
HCT: 35.1 % — ABNORMAL LOW (ref 39.0–52.0)
Hemoglobin: 11.5 g/dL — ABNORMAL LOW (ref 13.0–17.0)
MCH: 33.3 pg (ref 26.0–34.0)
MCHC: 32.8 g/dL (ref 30.0–36.0)
MCV: 101.7 fL — ABNORMAL HIGH (ref 80.0–100.0)
Platelets: 294 10*3/uL (ref 150–400)
RBC: 3.45 MIL/uL — ABNORMAL LOW (ref 4.22–5.81)
RDW: 15.7 % — ABNORMAL HIGH (ref 11.5–15.5)
WBC: 6.6 10*3/uL (ref 4.0–10.5)
nRBC: 0 % (ref 0.0–0.2)

## 2021-03-10 LAB — BASIC METABOLIC PANEL
Anion gap: 7 (ref 5–15)
BUN: 22 mg/dL (ref 8–23)
CO2: 24 mmol/L (ref 22–32)
Calcium: 9.5 mg/dL (ref 8.9–10.3)
Chloride: 105 mmol/L (ref 98–111)
Creatinine, Ser: 0.88 mg/dL (ref 0.61–1.24)
GFR, Estimated: >60 mL/min (ref >60)
Glucose, Bld: 80 mg/dL (ref 70–99)
Potassium: 4.1 mmol/L (ref 3.5–5.1)
Sodium: 136 mmol/L (ref 135–145)

## 2021-03-10 LAB — URINE CULTURE: Culture: NO GROWTH

## 2021-03-10 LAB — RPR: RPR Ser Ql: NONREACTIVE

## 2021-03-10 LAB — HIV ANTIBODY (ROUTINE TESTING W REFLEX): HIV Screen 4th Generation wRfx: NONREACTIVE

## 2021-03-10 MED ORDER — MELATONIN 3 MG PO TABS
3.0000 mg | ORAL_TABLET | Freq: Every evening | ORAL | Status: DC | PRN
  Filled 2021-03-10 (×2): qty 1

## 2021-03-10 NOTE — Progress Notes (Signed)
OT Cancellation Note  Patient Details Name: Mark Moore MRN: [id number] DOB: [birth year]   Cancelled Treatment:    Reason Eval/Treat Not Completed: Other (comment). Patient presents with elevated HR between 135-138 and per RN patient refusing to take Cardiazem at this time. Also use of electronic interpreter currently ineffective as interpreter cannot understand patient. Will have to wait till family present. Will f/u as able.  Annis Lagoy L Caleb Decock 03/10/2021, 11:25 AM

## 2021-03-10 NOTE — Progress Notes (Signed)
FPTS Interim Night Progress Note  S:Patient sleeping comfortably.  Rounded with primary night RN.  Reports daughter called earlier in shift and stated that patient sometimes takes melatonin to help with sleep.  Patient has been redirectable and not agitated. No other concerns voiced.  No orders required.    O: Today's Vitals   03/09/21 2253 03/09/21 2301 03/10/21 0000 03/10/21 0129  BP: (!) 133/98     Pulse: (!) 107     Resp: 16     Temp: 97.8 F (36.6 C)     TempSrc: Oral     SpO2: 95%     Weight:    61.2 kg  Height:      PainSc:  0-No pain 0-No pain 0-No pain      A/P: Continue Delirium/Fall precautions Redirect patient  Continue to monitor  Dana Allan MD PGY-3, [facility] Family Medicine Service pager [phone]

## 2021-03-10 NOTE — Discharge Instructions (Addendum)
Dear Era Bumpers,  Thank you for letting us participate in your care. You were hospitalized for confusion and diagnosed with a urinary tract infection. You were treated with IV antibiotics and your follow-up urine culture showed successful treatment.   POST-HOSPITAL & CARE INSTRUCTIONS You had a little bit of blood in your urine. This may have been from the catheter, but we will want your primary doctor to recheck your urine at your next appointment to make sure it is gone.  Go to your follow up appointments (listed below)   DOCTOR'S APPOINTMENT   Future Appointments  Date Time Provider Department Center  04/30/2021 10:00 AM Quintella Reichert, MD [facility]     Take care and be well!  Family Medicine Teaching Service Inpatient Team [place]  [facility]  [address] [place], [place] [address] [phone]

## 2021-03-10 NOTE — Progress Notes (Signed)
Family Medicine Teaching Service Daily Progress Note Intern Pager: [phone]  Patient name: Mark Moore Medical record number: [id number] Date of birth: 1928/09/22 Age: 87 y.o. Gender: male  Primary Care Provider: Uvaldo Bristle, NP Consultants: None Code Status: DNR  Pt Overview and Major Events to Date:  8/9- admitted  Assessment and Plan: Mark Moore is an 87yo male who presented from his extended care facility with altered mental status. PMH is significant for dementia, A fib on Pradaxa, hypertension, hyperlipidemia, GERD.  Altered Mental Status of Unknown Etiology in the Setting of Known ESBL Klebsiella Infection Vital signs stable overnight. Afebrile. Still without elevated WBC (6.6). Urine cx obtained here pending, but previous result from facility available in chart. In reviewing sensitivities from isolate obtained at his facility, it looks like our best option for oral coverage would be levofloxacin.  Mental status back at baseline today. A&Ox4. B12, TSH wnl. HIV, RPR pending. - Consider transition from Merrem to levofloxacin pending discussion on rounds - F/u blood and urine cx - f/u HIV, RPR - PT/OT - Continue contact precautions for ESBL -As he is at baseline, can likely discharge back to [facility] today versus tomorrow  Dementia  No issues with agitation or disorientation overnight.  - Continue delirium and fall precautions  L -Sided Pain Patient sitting up today no pain.  Pelvic ultrasound yesterday negative for hernia. -Can follow-up on hip pain as an outpatient  A Fib on Pradaxa Rate controlled. Still in A Fib on monitor. - Continue diltiazem 30mg  BID - Continue Pradaxa 150mg  BID  HTN, chronic, stable BP well-controlled. 121/88. - Continue spironolactone 25mg  daily  Glaucoma, chronic, stable - Continue alphagan, dorzolamide  GERD, chronic, stable - Continue Protonix 40mg  daily  BPH, chronic, stable - Continue finasteride 5mg   daily  FEN/GI: Heart healthy PPx: On full-dose dabigatran Dispo: Back to [name]  today. Barriers include transition to PO abx.   Subjective:  Mr. reports feeling well this morning.  He has no pain, no shortness of breath, chest pain.  His only complaint is that the food here has too much salt and too much sugar.  He reports that he likes to eat low-salt and low sugar foods.  He also was complaining that he had not had his eyedrops yet, we informed him that he would get these at 10 AM.  Objective: Temp:  [97.5 F (36.4 C)-98.2 F (36.8 C)] 98.1 F (36.7 C) (08/10 0820) Pulse Rate:  [50-107] 50 (08/10 0820) Resp:  [13-24] 17 (08/10 0515) BP: (117-144)/(79-100) 132/87 (08/10 0820) SpO2:  [92 %-100 %] 95 % (08/10 0820) Weight:  [61.2 kg-81 kg] 61.2 kg (08/10 0129) Physical Exam: General: Well-appearing, sitting up at side of bed with breakfast tray Cardiovascular: Irregularly irregular, no murmur Respiratory: Normal work of breathing on room air, lung fields clear throughout Abdomen: Soft, nontender, nondistended Extremities: No edema  Laboratory: Recent Labs  Lab 03/09/21 1121 03/10/21 0219  WBC 5.1 6.6  HGB 11.8* 11.5*  HCT 37.0* 35.1*  PLT 309 294   Recent Labs  Lab 03/09/21 1121 03/10/21 0219  NA 137 136  K 3.5 4.1  CL 106 105  CO2 24 24  BUN 24* 22  CREATININE 0.94 0.88  CALCIUM 9.6 9.5  PROT 6.1*  --   BILITOT 0.3  --   ALKPHOS 84  --   ALT 11  --   AST 13*  --   GLUCOSE 84 80    Imaging/Diagnostic Tests: 03-04-1984 pelvic Limited IMPRESSION: Negative  for bowel containing left groin hernia. Small benign appearing lymph node  Alicia Amel, MD 03/10/2021, 8:51 AM PGY-1, [facility] Family Medicine FPTS Intern pager: [phone], text pages welcome

## 2021-03-10 NOTE — Progress Notes (Signed)
PT Cancellation Note  Patient Details Name: Mark Moore MRN: [id number] DOB: Nov 30, 1928   Cancelled Treatment:    Reason Eval/Treat Not Completed: Other (comment). PT attempted to see patient however pt appears unable to hear video interpreter. PT has attempted to contact in-person Spanish interpreter without response at this time. PT will follow up once in-person Spanish interpreter is available.   Arlyss Gandy 03/10/2021, 2:13 PM

## 2021-03-10 NOTE — Plan of Care (Addendum)
Elderly adult mal admitted with UTI. Spanish-speaking only; interpreter services required. Patient is forgetful but pleasant and cooperative. Remains in A fib per tele box.  Edit: disoriented overnight; frequently getting up out of bed.   Problem: Education: Goal: Knowledge of General Education information will improve Description: Including pain rating scale, medication(s)/side effects and non-pharmacologic comfort measures Outcome: Progressing   Problem: Health Behavior/Discharge Planning: Goal: Ability to manage health-related needs will improve Outcome: Progressing   Problem: Clinical Measurements: Goal: Ability to maintain clinical measurements within normal limits will improve Outcome: Progressing Goal: Will remain free from infection Outcome: Progressing Goal: Diagnostic test results will improve Outcome: Progressing Goal: Respiratory complications will improve Outcome: Progressing Goal: Cardiovascular complication will be avoided Outcome: Progressing   Problem: Activity: Goal: Risk for activity intolerance will decrease Outcome: Progressing   Problem: Nutrition: Goal: Adequate nutrition will be maintained Outcome: Progressing   Problem: Coping: Goal: Level of anxiety will decrease Outcome: Progressing   Problem: Elimination: Goal: Will not experience complications related to bowel motility Outcome: Progressing Goal: Will not experience complications related to urinary retention Outcome: Progressing   Problem: Pain Managment: Goal: General experience of comfort will improve Outcome: Progressing   Problem: Safety: Goal: Ability to remain free from injury will improve Outcome: Progressing   Problem: Skin Integrity: Goal: Risk for impaired skin integrity will decrease Outcome: Progressing

## 2021-03-11 DIAGNOSIS — R4182 Altered mental status, unspecified: Secondary | ICD-10-CM | POA: Diagnosis not present

## 2021-03-11 LAB — SARS CORONAVIRUS 2 (TAT 6-24 HRS): SARS Coronavirus 2: NEGATIVE

## 2021-03-11 NOTE — Progress Notes (Signed)
OT Cancellation Note  Patient Details Name: Avant Printy MRN: [id number] DOB: 11-15-1928   Cancelled Treatment:     Pt. Is dc back to his facility today. OT needs can be addressed there if needed.   Cynthie Garmon 03/11/2021, 11:56 AM

## 2021-03-11 NOTE — Discharge Summary (Addendum)
Family Medicine Teaching [facility] Discharge Summary  Patient name: Mark Moore Medical record number: [id number] Date of birth: March 20, 1929 Age: 87 y.o. Gender: male Date of Admission: 03/09/2021  Date of Discharge: 03/11/2021 Admitting Physician: Alicia Amel, MD  Primary Care Provider: Uvaldo Bristle, NP Consultants: None  Indication for Hospitalization: Altered Mental Status in the Setting of ESBL Klebsiella UTI  Discharge Diagnoses/Problem List:  Active Problems:   ESBL (extended spectrum beta-lactamase) producing bacteria infection Atrial Fibrillation  Chronic Hip Pain  Disposition: [name]  Discharge Condition: At baseline, stable  Discharge Exam:  Vitals:   03/11/21 0800 03/11/21 0923  BP: 126/90 125/81  Pulse: 100 78  Resp:  18  Temp: (!) 97.4 F (36.3 C) 97.7 F (36.5 C)  SpO2: 95% 97%   From same-day progress note: General: Well-appearing, sitting up at side of bed eating breakfast, NAD Cardiovascular: Irregular, No murmur, rub, or gallop Respiratory: Normal WOB on RA, CTAB Extremities: Without edema Neuro: A&O, no focal deficit    Brief Hospital Course:  Mark Moore is an 87 yo male who presented from his extended care facility with altered mental status. PMH is significant for dementia, A fib on Pradaxa, hypertension, hyperlipidemia, GERD. Below is his brief hospital course outlined by problem. For additional information, please refer to the H&P.   AMS, Dementia  ESBL Klebsiella Infection Patient had recently been started on fosfomycin at his extended care facility for ESBL UTI. In ED vitals stable, labs without leukocytosis. UA with trace leukocytes but no bacteria. Blood and urine cultures obtained and patient started on IV meropenem. Given his AMS, CT head was obtained and negative for acute findings. Reversible causes of AMS also evaluated for and TSH, B12, HIV, RPR were all normal/negative. On hospital day 2, his  mental status had returned to baseline. He was no longer confused or agitated and answered all questions appropriately.  His urine culture obtained in the ED did not grow any bacterial colonies, suggesting successful treatment of his UTI and IV meropenem was discontinued. He remained stable and afebrile throughout admission.   Atrial Fibrillation Patient was in rhythm controlled atrial fibrillation on admission.  He was continued on his home diltiazem and Pradaxa and monitored on cardiac telemetry throughout hospitalization.  Hip Pain Patient c/o hip pain. In discussion with caretaker, it was noted that there was concern for possible inguinal hernia. Patient had pelvic ultrasound for further evaluation and this was negative for bowel containing hernia.  Other conditions chronic and stable.  Items for PCP follow-up: 1) He had some microscopic hematuria on admission UA. Recommend follow-up outpatient UA   Significant Procedures: None  Significant Labs and Imaging:  Recent Labs  Lab 03/09/21 1121 03/10/21 0219  WBC 5.1 6.6  HGB 11.8* 11.5*  HCT 37.0* 35.1*  PLT 309 294   Recent Labs  Lab 03/09/21 1121 03/10/21 0219  NA 137 136  K 3.5 4.1  CL 106 105  CO2 24 24  GLUCOSE 84 80  BUN 24* 22  CREATININE 0.94 0.88  CALCIUM 9.6 9.5  ALKPHOS 84  --   AST 13*  --   ALT 11  --   ALBUMIN 3.5  --     Results/Tests Pending at Time of Discharge: None  Discharge Medications:  Allergies as of 03/11/2021       Reactions   Wellbutrin [bupropion] Other (See Comments)   XL 150 mg- Made the patient feel paranoid and have borderline hallucinations  Medication List     STOP taking these medications    doxycycline 100 MG tablet Commonly known as: VIBRA-TABS   escitalopram 5 MG tablet Commonly known as: Lexapro   fosfomycin 3 g Pack Commonly known as: MONUROL   ondansetron 4 MG tablet Commonly known as: ZOFRAN       TAKE these medications    acetaminophen 500  MG tablet Commonly known as: TYLENOL Take 1,000 mg by mouth 3 (three) times daily. What changed: Another medication with the same name was removed. Continue taking this medication, and follow the directions you see here.   atorvastatin 10 MG tablet Commonly known as: LIPITOR Take 1 tablet (10 mg total) by mouth daily.   dabigatran 150 MG Caps capsule Commonly known as: Pradaxa Take 1 capsule (150 mg total) by mouth 2 (two) times daily.   diltiazem 30 MG tablet Commonly known as: CARDIZEM Take 1 tablet (30 mg total) by mouth in the morning and at bedtime.   dorzolamide 2 % ophthalmic solution Commonly known as: TRUSOPT Place 1 drop into both eyes 2 (two) times daily.   finasteride 5 MG tablet Commonly known as: PROSCAR Take 1 tablet (5 mg total) by mouth daily.   pantoprazole 40 MG tablet Commonly known as: PROTONIX Take 1 tablet (40 mg total) by mouth daily.   spironolactone 25 MG tablet Commonly known as: ALDACTONE Take 1 tablet (25 mg total) by mouth daily.       ASK your doctor about these medications    brimonidine 0.2 % ophthalmic solution Commonly known as: ALPHAGAN Place 1 drop into both eyes 3 (three) times daily.        Discharge Instructions: Please refer to Patient Instructions section of EMR for full details.  Patient was counseled important signs and symptoms that should prompt return to medical care, changes in medications, dietary instructions, activity restrictions, and follow up appointments.   Follow-Up Appointments:  Follow-up Information     Uvaldo Bristle, NP Follow up.   Specialty: Gerontology Contact information: [address] [place] [address] [phone]                 Alicia Amel, MD 03/11/2021, 10:19 AM PGY-1, [facility]  FPTS Upper-Level Resident Addendum   I have independently interviewed and examined the patient. I have discussed the above with the original author and agree with their  documentation. My edits for correction/addition/clarification are included where appropriate. Please see also any attending notes.   Sabino Dick, DO PGY-2, [facility] 03/11/2021 11:42 AM  FPTS Service pager: [phone] (text pages welcome through [facility])

## 2021-03-11 NOTE — [place] FL2 (Signed)
[place] MEDICAID FL2 LEVEL OF CARE SCREENING TOOL     IDENTIFICATION  Patient Name: Mark Moore Birthdate: 1929-03-03 Sex: male Admission Date (Current Location): 03/09/2021  [facility] and [place] Number:  Producer, television/film/video and Address:  [facility], [address], [place], [place] [address]      Provider Number: [id number]  Attending Physician Name and Address:  McDiarmid, Leighton Roach, MD  Relative Name and Phone Number:  Artist Pais daughter- [phone]    Current Level of Care: Hospital Recommended Level of Care: Assisted Living Facility Prior Approval Number:    Date Approved/Denied:   PASRR Number:    Discharge Plan: Domiciliary (Rest home)    Current Diagnoses: Patient Active Problem List   Diagnosis Date Noted   ESBL (extended spectrum beta-lactamase) producing bacteria infection 03/09/2021   Altered mental status    Acute encephalopathy 01/18/2021   Acute metabolic encephalopathy 01/17/2021   Atrial fibrillation, chronic (HCC) 01/17/2021   Fall (on)(from) sidewalk curb, initial encounter 01/17/2021   Dementia (HCC) 01/17/2021   Depression 01/17/2021   Dyslipidemia 01/17/2021   Glaucoma 01/17/2021   Thoracic compression fracture, closed, initial encounter (HCC) 01/17/2021   DNR (do not resuscitate) 01/17/2021   Hyperlipidemia 12/03/2020   Hypertension 12/02/2020   Glaucoma 12/02/2020   Depression 12/02/2020   GERD (gastroesophageal reflux disease) 12/02/2020   BPH (benign prostatic hyperplasia) 12/02/2020   Atrial fibrillation (HCC) 12/02/2020    Orientation RESPIRATION BLADDER Height & Weight      (altered pleasant)  Normal   Weight: 61.2 kg Height:  5\' 9"  (175.3 cm)  BEHAVIORAL SYMPTOMS/MOOD NEUROLOGICAL BOWEL NUTRITION STATUS        Diet (See discharge papers)  AMBULATORY STATUS COMMUNICATION OF NEEDS Skin   Extensive Assist Verbally (spanish speaking)                         Personal Care  Assistance Level of Assistance  Total care       Total Care Assistance: Limited assistance   Functional Limitations Info  Hearing   Hearing Info: Impaired      SPECIAL CARE FACTORS FREQUENCY  Speech therapy, PT (By licensed PT)     PT Frequency: resume as previous OT Frequency: resume as previous     Speech Therapy Frequency: resume a previous      Contractures Contractures Info: Not present    Additional Factors Info  Code Status Code Status Info: DNR             Current Medications (03/11/2021):  This is the current hospital active medication list Current Facility-Administered Medications  Medication Dose Route Frequency Provider Last Rate Last Admin   atorvastatin (LIPITOR) tablet 10 mg  10 mg Oral Daily 05/11/2021 B, MD   10 mg at 03/10/21 0921   brimonidine (ALPHAGAN) 0.2 % ophthalmic solution 1 drop  1 drop Both Eyes TID 05/10/21, MD   1 drop at 03/10/21 1935   dabigatran (PRADAXA) capsule 150 mg  150 mg Oral BID 05/10/21, MD   150 mg at 03/10/21 2123   diltiazem (CARDIZEM) tablet 30 mg  30 mg Oral Q12H 2124 B, MD   30 mg at 03/10/21 2123   dorzolamide (TRUSOPT) 2 % ophthalmic solution 1 drop  1 drop Both Eyes BID 2124, MD   1 drop at 03/10/21 1933   finasteride (PROSCAR) tablet 5 mg  5 mg Oral Daily  Alicia Amel, MD   5 mg at 03/10/21 5809   melatonin tablet 3 mg  3 mg Oral QHS PRN Dow Adolph N, DO       pantoprazole (PROTONIX) EC tablet 40 mg  40 mg Oral Daily Alicia Amel, MD   40 mg at 03/10/21 9833   spironolactone (ALDACTONE) tablet 25 mg  25 mg Oral Daily Alicia Amel, MD   25 mg at 03/10/21 8250     Discharge Medications: Please see discharge summary for a list of discharge medications.  Relevant Imaging Results:  Relevant Lab Results:   Additional Information SSN [id number]  Lockie Pares, RN

## 2021-03-11 NOTE — Progress Notes (Signed)
#  [id number] interpreter used for assessment and explanation of medications administered.

## 2021-03-11 NOTE — Progress Notes (Addendum)
Family Medicine Teaching Service Daily Progress Note Intern Pager: [phone]  Patient name: Mark Moore Medical record number: [id number] Date of birth: [birth year] Age: 87 y.o. Gender: male  Primary Care Provider: Uvaldo Bristle, NP Consultants: None Code Status: DNR  Pt Overview and Major Events to Date:  8/9- admitted  Assessment and Plan: Davione Lenker is in 87yo male who presented from his extended care facility with AMS, found to have ESBL Klebsiella UTI. PMH is significant for dementia, A fib on Pradaxa, HTN, HLD, GERD.  AMS of unknown etiology in the setting of known ESBL Klebsiella Infection VSS. Afebrile. Urine cx with no growth. Mentating at [facility] and feels well. A&O. TSH, B12 wnl, HIV, RPR neg.  - Discharge back to facility today  - Can d/c contact precautions - Will need TOC to draw up FL2 and help arrange transport through [facility]  Dementia No issues with agitation or disorientation overnight. - Continue delirium and fall precautions  A Fib on Pradaxa Rate controlled, though remains in A Fib. - Continue diltiazem 30mg  BID - Continue pradaxa 150mg  BID  HTN, chronic, stable BP well-controlled. - Continue spironolactone 25mg  daily  Glaucoma, chronic, stable - Continue alphagan, dorzolamide  GERD, chronic, stable - Continue protonix 40mg  daily  BPH, chronic, stable - Continue finasteride 5mg  daily  FEN/GI: Heart Healthy PPx: On dabigatran for afib Dispo: [name]  today. Barriers include FL2 and transport.   Subjective:  No complaints. Has enjoyed low-salt, low-sodium diet.  Feels ready for discharge.  Objective: Temp:  [97.4 F (36.3 C)-98.1 F (36.7 C)] 97.4 F (36.3 C) (08/10 1953) Pulse Rate:  [50-113] 101 (08/10 1953) Resp:  [20] 20 (08/10 1953) BP: (120-153)/(80-98) 153/98 (08/10 1953) SpO2:  [95 %-100 %] 100 % (08/10 1953) Physical Exam: General: Well-appearing, sitting up at side of bed eating breakfast,  NAD Cardiovascular: Irregular, No murmur, rub, or gallop Respiratory: Normal WOB on RA, CTAB Extremities: Without edema Neuro: A&O, no focal deficit  Laboratory: Recent Labs  Lab 03/09/21 1121 03/10/21 0219  WBC 5.1 6.6  HGB 11.8* 11.5*  HCT 37.0* 35.1*  PLT 309 294   Recent Labs  Lab 03/09/21 1121 03/10/21 0219  NA 137 136  K 3.5 4.1  CL 106 105  CO2 24 24  BUN 24* 22  CREATININE 0.94 0.88  CALCIUM 9.6 9.5  PROT 6.1*  --   BILITOT 0.3  --   ALKPHOS 84  --   ALT 11  --   AST 13*  --   GLUCOSE 84 80     Imaging/Diagnostic Tests: No new imaging, tests  03-04-1984, MD 03/11/2021, 6:52 AM PGY-1,  Family Medicine FPTS Intern pager: [phone], text pages welcome

## 2021-03-11 NOTE — Plan of Care (Signed)

## 2021-03-11 NOTE — Progress Notes (Signed)
PT Cancellation Note  Patient Details Name: Mark Moore MRN: [id number] DOB: Nov 25, 1928   Cancelled Treatment:    Reason Eval/Treat Not Completed: Patient to discharge back to ALF today; no acute PT needs identified. Will sign off. Please reconsult if new needs arise.  Ina Homes, PT, DPT Acute Rehabilitation Services  Pager [phone] Office [phone]  Malachy Chamber 03/11/2021, 10:10 AM

## 2021-03-11 NOTE — Progress Notes (Signed)
Pt is sitting on side of bed during report anf eating. Asked for a writing utensil speaking spanish and confused oriented 2-3 with only 2 hours of sleep.

## 2021-03-11 NOTE — TOC Initial Note (Addendum)
Transition of Care [facility]) - Initial/Assessment Note    Patient Details  Name: Mark Moore MRN: [id number] Date of Birth: 1929-02-18  Transition of [facility]) CM/SW Contact:    Lynett Grimes Phone Number: 03/11/2021, 1:57 PM  Clinical Narrative:                 Pt is a resident at [facility], CSW spoke with pt daughter in room about pt DC today. She had concerns about antibiotics for pt after speaking with facility RN. Pt daughter provided the contact information for [facility]  [phone]) pt ALF nurse. CSW contacted [facility] and she explained that pt would need to continue IV antibiotics bc the po antibiotics would not treat pt as he has been sent back to the hospital for the same issues. CSW reached out to [facility] to discuss [facility] concerns.  [name] pt to go to [facility] with IV antibiotics bc that is where he was before when needing antibiotics.  CSW let RN at [facility] know pt would need to be seen by PT/OT again in order to get back into [facility]. CSW will follow up with the facility and pt daughter on further DC plans.  Expected Discharge Plan: Assisted Living Barriers to Discharge: Continued Medical Work up   Patient Goals and CMS Choice Patient states their goals for this hospitalization and ongoing recovery are:: Return to ALF CMS Medicare.gov Compare Post Acute Care list provided to:: Patient Choice offered to / list presented to : Patient  Expected Discharge Plan and Services Expected Discharge Plan: Assisted Living In-house Referral: Clinical Social Work   Post Acute Care Choice: NA Living arrangements for the past 2 months: Assisted Living Facility Expected Discharge Date: 03/11/21                                    Prior Living Arrangements/Services Living arrangements for the past 2 months: Assisted Living Facility Lives with:: Facility Resident Patient language and need for interpreter reviewed:: Yes (Spanish speaking dauhgter  in the room speaks english) Do you feel safe going back to the place where you live?: Yes      Need for Family Participation in Patient Care: Yes (Comment) Care giver support system in place?: Yes (comment)   Criminal Activity/Legal Involvement Pertinent to Current Situation/Hospitalization: No - Comment as needed  Activities of Daily Living   ADL Screening (condition at time of admission) Patient's cognitive ability adequate to safely complete daily activities?: No Is the patient deaf or have difficulty hearing?: No Does the patient have difficulty seeing, even when wearing glasses/contacts?: No Does the patient have difficulty concentrating, remembering, or making decisions?: Yes Patient able to express need for assistance with ADLs?: Yes Does the patient have difficulty dressing or bathing?: Yes Independently performs ADLs?: No Does the patient have difficulty walking or climbing stairs?: Yes Weakness of Legs: None Weakness of Arms/Hands: None  Permission Sought/Granted Permission sought to share information with : Family Supports, Magazine features editor    Share Information with NAME: Artist Pais (Daughter)   [phone]  Permission granted to share info w AGENCY: [name]  Permission granted to share info w Relationship: Artist Pais (Daughter)   [phone]  Permission granted to share info w Contact Information: Artist Pais (Daughter)   [phone]  Emotional Assessment Appearance:: Appears stated age Attitude/Demeanor/Rapport: Engaged Affect (typically observed): Appropriate Orientation: : Oriented to  Place, Oriented to Self Alcohol / Substance Use: Not Applicable Psych Involvement: No (comment)  Admission diagnosis:  Lower urinary tract infectious disease [N39.0] ESBL (extended spectrum beta-lactamase) producing bacteria infection [A49.9, Z16.12] Hernia, inguinal, left [K40.90] Groin pain, left [R10.32] Altered mental status,  unspecified altered mental status type [R41.82] Patient Active Problem List   Diagnosis Date Noted   ESBL (extended spectrum beta-lactamase) producing bacteria infection 03/09/2021   Altered mental status    Acute encephalopathy 01/18/2021   Acute metabolic encephalopathy 01/17/2021   Atrial fibrillation, chronic (HCC) 01/17/2021   Fall (on)(from) sidewalk curb, initial encounter 01/17/2021   Dementia (HCC) 01/17/2021   Depression 01/17/2021   Dyslipidemia 01/17/2021   Glaucoma 01/17/2021   Thoracic compression fracture, closed, initial encounter (HCC) 01/17/2021   DNR (do not resuscitate) 01/17/2021   Hyperlipidemia 12/03/2020   Hypertension 12/02/2020   Glaucoma 12/02/2020   Depression 12/02/2020   GERD (gastroesophageal reflux disease) 12/02/2020   BPH (benign prostatic hyperplasia) 12/02/2020   Atrial fibrillation (HCC) 12/02/2020   PCP:  Uvaldo Bristle, NP Pharmacy:   [id number] - [place], [place] - [address] [place] [address] Phone: [phone] Fax: [phone]     Social Determinants of Health (SDOH) Interventions    Readmission Risk Interventions No flowsheet data found.

## 2021-03-11 NOTE — Evaluation (Signed)
Physical Therapy Evaluation Patient Details Name: Mark Moore MRN: [id number] DOB: 1929-02-21 Today's Date: 03/11/2021   History of Present Illness  Pt is an 87 y.o. male admitted from ALF on 03/09/2021 with AMS in the setting of known ESBL Klebsiella Infection. PMH includes dementia, Afib, HTN, HLD, GERD, T12 compression fx from fall (12/2020).   Clinical Impression  Pt presents with an overall decrease in functional mobility secondary to above. PTA, pt from ALF, suspect limited ambulation with RW; pt endorses feeling off balance. Today, pt requiring consistent minA to stand and maintain balance with short ambulation distance using RW. Difficult to communicate via Spanish video interpreter as pt very HOH, but following majority of simple commands well. Pt would benefit from continued acute PT services to maximize functional mobility and independence prior to d/c with SNF-level therapies.     Follow Up Recommendations SNF;Supervision for mobility/OOB    Equipment Recommendations  Rolling walker with 5" wheels;3in1 (PT)    Recommendations for Other Services       Precautions / Restrictions Precautions Precautions: Fall Restrictions Weight Bearing Restrictions: No      Mobility  Bed Mobility               General bed mobility comments: Received sitting in recliner    Transfers Overall transfer level: Needs assistance Equipment used: Rolling walker (2 wheeled) Transfers: Sit to/from Stand Sit to Stand: Min assist         General transfer comment: Multiple sit<>stands from recliner and EOB to RW, pt requiring consistent minA to assist trunk elevation; pt appears fearful of falling and reports feeling off balance  Ambulation/Gait Ambulation/Gait assistance: Min assist Gait Distance (Feet): 4 Feet Assistive device: Rolling walker (2 wheeled) Gait Pattern/deviations: Step-to pattern;Trunk flexed Gait velocity: Decreased   General Gait Details: Slow, unsteady  gait with RW and intermittent minA for balance and RW navigation; pt with SOB which seems related to fear of falling, declines further distance or ambulation attempts  Stairs            Wheelchair Mobility    Modified Rankin (Stroke Patients Only)       Balance Overall balance assessment: Needs assistance   Sitting balance-Leahy Scale: Fair       Standing balance-Leahy Scale: Poor Standing balance comment: Reliant on UE support                             Pertinent Vitals/Pain Pain Assessment: No/denies pain    Home Living Family/patient expects to be discharged to:: Assisted living                      Prior Function Level of Independence: Needs assistance   Gait / Transfers Assistance Needed: Ambulatory with RW, but pt reports feeling "sideways" (aka off balance and has trouble walking)           Hand Dominance        Extremity/Trunk Assessment   Upper Extremity Assessment Upper Extremity Assessment: Generalized weakness    Lower Extremity Assessment Lower Extremity Assessment: Generalized weakness       Communication   Communication: Interpreter utilized;Prefers language other than English  Cognition Arousal/Alertness: Awake/alert Behavior During Therapy: WFL for tasks assessed/performed Overall Cognitive Status: History of cognitive impairments - at baseline  General Comments: Per chart, h/o dementia. Pt appears confused at times, exacerbated by very HOH and difficulty hearing spanish interpreter. Following majority of simple commands when heard      General Comments General comments (skin integrity, edema, etc.): Session performed with Spanish interpreter via stratus video app    Exercises     Assessment/Plan    PT Assessment Patient needs continued PT services  PT Problem List Decreased strength;Decreased activity tolerance;Decreased balance;Decreased mobility;Decreased  cognition;Decreased knowledge of use of DME       PT Treatment Interventions DME instruction;Gait training;Functional mobility training;Therapeutic activities;Therapeutic exercise;Balance training;Patient/family education    PT Goals (Current goals can be found in the Care Plan section)  Acute Rehab PT Goals Patient Stated Goal: None stated PT Goal Formulation: With patient Time For Goal Achievement: 03/25/21 Potential to Achieve Goals: Fair    Frequency Min 2X/week   Barriers to discharge        Co-evaluation               AM-PAC PT "6 Clicks" Mobility  Outcome Measure Help needed turning from your back to your side while in a flat bed without using bedrails?: A Little Help needed moving from lying on your back to sitting on the side of a flat bed without using bedrails?: A Little Help needed moving to and from a bed to a chair (including a wheelchair)?: A Little Help needed standing up from a chair using your arms (e.g., wheelchair or bedside chair)?: A Little Help needed to walk in hospital room?: A Little Help needed climbing 3-5 steps with a railing? : A Lot 6 Click Score: 17    End of Session Equipment Utilized During Treatment: Gait belt Activity Tolerance: Patient tolerated treatment well;Patient limited by fatigue Patient left: in chair;with call bell/phone within reach Nurse Communication: Mobility status PT Visit Diagnosis: Other abnormalities of gait and mobility (R26.89);Muscle weakness (generalized) (M62.81)    Time: 6283-6629 PT Time Calculation (min) (ACUTE ONLY): 16 min   Charges:   PT Evaluation $PT Eval Moderate Complexity: 1 Mod        Ina Homes, PT, DPT Acute Rehabilitation Services  Pager [phone] Office [phone]  Malachy Chamber 03/11/2021, 5:15 PM

## 2021-03-11 NOTE — Progress Notes (Signed)
FPTS Interim Night Progress Note  S:Patient sleeping comfortably.  Rounded with primary night RN.  No concerns voiced.  No orders required.    O: Today's Vitals   03/10/21 0820 03/10/21 1232 03/10/21 1911 03/10/21 1953  BP: 132/87 120/80 (!) 153/98 (!) 153/98  Pulse: (!) 50 (!) 113 (!) 101 (!) 101  Resp:   20 20  Temp: 98.1 F (36.7 C)  (!) 97.4 F (36.3 C) (!) 97.4 F (36.3 C)  TempSrc: Oral   Oral  SpO2: 95% 95%  100%  Weight:      Height:      PainSc:    0-No pain      A/P: Continue current management  Dana Allan MD PGY-3, [facility] Service pager [phone]

## 2021-03-12 DIAGNOSIS — R410 Disorientation, unspecified: Secondary | ICD-10-CM | POA: Diagnosis not present

## 2021-03-12 MED ORDER — COVID-19 MRNA VACC (MODERNA) 50 MCG/0.25ML IM SUSP
0.2500 mL | Freq: Once | INTRAMUSCULAR | Status: DC
  Filled 2021-03-12: qty 0.25

## 2021-03-12 NOTE — Discharge Summary (Signed)
Family Medicine Teaching [facility] Discharge Summary  Patient name: Mark Moore Medical record number: [id number] Date of birth: 1929-03-12 Age: 87 y.o. Gender: male Date of Admission: 03/09/2021  Date of Discharge: 03/12/2021 Admitting Physician: Alicia Amel, MD  Primary Care Provider: Uvaldo Bristle, NP Consultants: None  Indication for Hospitalization: AMS in the Setting of ESBL Klebsiella UTI  Discharge Diagnoses/Problem List:  Principal Problem:   Altered mental status   Disposition: SNF  Discharge Condition: At baseline, stable  Discharge Exam:  Vitals:   03/12/21 0523 03/12/21 1223  BP: (!) 100/57 (!) 142/85  Pulse: 75 87  Resp: 18 20  Temp: 98 F (36.7 C) 98.3 F (36.8 C)  SpO2: 98% 99%   From my same day progress exam  General: Well-appearing, sitting up on side of bed, NAD Cardiovascular: Irregular, no murmur, rub, gallop Respiratory: Normal work of breathing on room air, clear to auscultation bilaterally Neuro: Alert and oriented, no focal deficit Extremities: Without edema  Brief Hospital Course:  Mark Moore is an 87 yo male who presented from his extended care facility with altered mental status. PMH is significant for dementia, A fib on Pradaxa, hypertension, hyperlipidemia, GERD. Below is his brief hospital course outlined by problem. For additional information, please refer to the H&P.   AMS, Dementia  ESBL Klebsiella Infection Patient had recently been started on fosfomycin at his extended care facility for ESBL UTI. In ED vitals stable, labs without leukocytosis. UA with trace leukocytes but no bacteria. Blood and urine cultures obtained and patient started on IV meropenem. Given his AMS, CT head was obtained and negative for acute findings. Reversible causes of AMS also evaluated for and TSH, B12, HIV, RPR were all normal/negative. On hospital day 2, his mental status had returned to baseline. He was no longer confused or  agitated and answered all questions appropriately.  His urine culture obtained in the ED did not grow any bacterial colonies, suggesting successful treatment of his UTI and IV meropenem was discontinued. He remained stable and afebrile throughout admission.   Atrial Fibrillation Patient was in rhythm controlled atrial fibrillation on admission.  He was continued on his home diltiazem and Pradaxa and monitored on cardiac telemetry throughout hospitalization.  Hip Pain Patient c/o hip pain. In discussion with caretaker, it was noted that there was concern for possible inguinal hernia. Patient had pelvic ultrasound for further evaluation and this was negative for bowel containing hernia.  Other conditions chronic and stable.  Items for PCP follow-up: 1) He had some microscopic hematuria on admission UA. Recommend follow-up outpatient UA    Significant Procedures: None  Significant Labs and Imaging:  Recent Labs  Lab 03/09/21 1121 03/10/21 0219  WBC 5.1 6.6  HGB 11.8* 11.5*  HCT 37.0* 35.1*  PLT 309 294   Recent Labs  Lab 03/09/21 1121 03/10/21 0219  NA 137 136  K 3.5 4.1  CL 106 105  CO2 24 24  GLUCOSE 84 80  BUN 24* 22  CREATININE 0.94 0.88  CALCIUM 9.6 9.5  ALKPHOS 84  --   AST 13*  --   ALT 11  --   ALBUMIN 3.5  --      Results/Tests Pending at Time of Discharge: None   Discharge Medications:  Allergies as of 03/12/2021       Reactions   Wellbutrin [bupropion] Other (See Comments)   XL 150 mg- Made the patient feel paranoid and have borderline hallucinations  Medication List     STOP taking these medications    doxycycline 100 MG tablet Commonly known as: VIBRA-TABS   escitalopram 5 MG tablet Commonly known as: Lexapro   fosfomycin 3 g Pack Commonly known as: MONUROL   ondansetron 4 MG tablet Commonly known as: ZOFRAN       TAKE these medications    acetaminophen 500 MG tablet Commonly known as: TYLENOL Take 1,000 mg by mouth 3  (three) times daily. What changed: Another medication with the same name was removed. Continue taking this medication, and follow the directions you see here.   atorvastatin 10 MG tablet Commonly known as: LIPITOR Take 1 tablet (10 mg total) by mouth daily.   dabigatran 150 MG Caps capsule Commonly known as: Pradaxa Take 1 capsule (150 mg total) by mouth 2 (two) times daily.   diltiazem 30 MG tablet Commonly known as: CARDIZEM Take 1 tablet (30 mg total) by mouth in the morning and at bedtime.   dorzolamide 2 % ophthalmic solution Commonly known as: TRUSOPT Place 1 drop into both eyes 2 (two) times daily.   finasteride 5 MG tablet Commonly known as: PROSCAR Take 1 tablet (5 mg total) by mouth daily.   pantoprazole 40 MG tablet Commonly known as: PROTONIX Take 1 tablet (40 mg total) by mouth daily.   spironolactone 25 MG tablet Commonly known as: ALDACTONE Take 1 tablet (25 mg total) by mouth daily.       ASK your doctor about these medications    brimonidine 0.2 % ophthalmic solution Commonly known as: ALPHAGAN Place 1 drop into both eyes 3 (three) times daily.        Discharge Instructions: Please refer to Patient Instructions section of EMR for full details.  Patient was counseled important signs and symptoms that should prompt return to medical care, changes in medications, dietary instructions, activity restrictions, and follow up appointments.   Follow-Up Appointments:  Follow-up Information     Uvaldo Bristle, NP Follow up.   Specialty: Gerontology Contact information: [address] [place] [address] [phone]                 Alicia Amel, MD 03/12/2021, 3:19 PM PGY-1, [facility]

## 2021-03-12 NOTE — Evaluation (Signed)
Occupational Therapy Evaluation Patient Details Name: Mark Moore MRN: [id number] DOB: April 04, 1929 Today's Date: 03/12/2021    History of Present Illness Pt is an 87 y.o. male admitted from ALF on 03/09/2021 with AMS in the setting of known ESBL Klebsiella Infection. PMH includes dementia, Afib, HTN, HLD, GERD, T12 compression fx from fall (12/2020).   Clinical Impression   This 87 yo male admitted with above presents to acute OT at a setup/S-Min A level for all basic ADLs and mobility with RW. He was at an ALF pta getting intermittent A for basic ADLs prn at a RW level. He will continue to benefit from acute OT with follow up OT at SNF to get to an overall setup/S level due to his dementia.     Follow Up Recommendations  SNF;Supervision/Assistance - 24 hour    Equipment Recommendations  None recommended by OT       Precautions / Restrictions Precautions Precautions: Fall Restrictions Weight Bearing Restrictions: No      Mobility Bed Mobility               General bed mobility comments: Received sitting in recliner    Transfers Overall transfer level: Needs assistance Equipment used: Rolling walker (2 wheeled) Transfers: Sit to/from Stand Sit to Stand: Min guard         General transfer comment: ambulated in room with RW with min guard A, min A when putting RW off to the side (no LOB noted)    Balance Overall balance assessment: Needs assistance Sitting-balance support: No upper extremity supported;Feet supported Sitting balance-Leahy Scale: Good     Standing balance support: Single extremity supported Standing balance-Leahy Scale: Poor                             ADL either performed or assessed with clinical judgement   ADL Overall ADL's : Needs assistance/impaired Eating/Feeding: Set up;Sitting   Grooming: Wash/dry hands;Wash/dry face;Oral care;Min guard;Standing Grooming Details (indicate cue type and reason): at sink Upper Body  Bathing: Set up;Supervision/ safety;Sitting   Lower Body Bathing: Min guard;Sit to/from stand   Upper Body Dressing : Supervision/safety;Set up;Sitting   Lower Body Dressing: Min guard;Sit to/from stand   Toilet Transfer: Min guard;Ambulation;RW Toilet Transfer Details (indicate cue type and reason): Pt did place RW off to the side to walk into the bathroom to stand to urinate at toilet, held onto grab bar for support Toileting- Clothing Manipulation and Hygiene: Min guard Toileting - Clothing Manipulation Details (indicate cue type and reason): standing             Vision Baseline Vision/History: Wears glasses Wears Glasses: Reading only Patient Visual Report: No change from baseline              Pertinent Vitals/Pain Pain Assessment: No/denies pain     Hand Dominance Right   Extremity/Trunk Assessment Upper Extremity Assessment Upper Extremity Assessment: Overall WFL for tasks assessed           Communication Communication Communication: Interpreter utilized;Prefers language other than English (spainish; dtr interpreted to start then used ipad video interpreter)   Cognition Arousal/Alertness: Awake/alert Behavior During Therapy: WFL for tasks assessed/performed Overall Cognitive Status: History of cognitive impairments - at baseline                                 General Comments: Per chart, h/o dementia.  Pt appears confused at times with open ended quesions-- exacerbated by very HOH and difficulty hearing spanish interpreter--ipad. Following all simple commands. Does well with gestures as well.              Home Living Family/patient expects to be discharged to:: Assisted living                             Home Equipment: Dan Humphreys - 2 wheels;Walker - 4 wheels          Prior Functioning/Environment Level of Independence: Needs assistance  Gait / Transfers Assistance Needed: Ambulatory with RW ADL's / Homemaking Assistance  Needed: Needs A            OT Problem List: Impaired balance (sitting and/or standing);Decreased cognition;Decreased safety awareness      OT Treatment/Interventions: Self-care/ADL training;DME and/or AE instruction;Patient/family education;Balance training    OT Goals(Current goals can be found in the care plan section) Acute Rehab OT Goals Patient Stated Goal: to take a nap OT Goal Formulation: With patient Time For Goal Achievement: 03/26/21 Potential to Achieve Goals: Good  OT Frequency: Min 2X/week    AM-PAC OT "6 Clicks" Daily Activity     Outcome Measure Help from another person eating meals?: None Help from another person taking care of personal grooming?: A Little Help from another person toileting, which includes using toliet, bedpan, or urinal?: A Little Help from another person bathing (including washing, rinsing, drying)?: A Little Help from another person to put on and taking off regular upper body clothing?: A Little Help from another person to put on and taking off regular lower body clothing?: A Little 6 Click Score: 19   End of Session Equipment Utilized During Treatment: Gait belt;Rolling walker  Activity Tolerance: Patient tolerated treatment well Patient left: in chair;with call bell/phone within reach;with chair alarm set  OT Visit Diagnosis: Unsteadiness on feet (R26.81);Muscle weakness (generalized) (M62.81);Other symptoms and signs involving cognitive function                Time: 1000-1030 OT Time Calculation (min): 30 min Charges:  OT General Charges $OT Visit: 1 Visit OT Evaluation $OT Eval Moderate Complexity: 1 Mod OT Treatments $Self Care/Home Management : 8-22 mins  Ignacia Palma, OTR/L Acute [employer] Pager [phone] Office [phone]    Evette Georges 03/12/2021, 10:37 AM

## 2021-03-12 NOTE — Plan of Care (Signed)
  Problem: Pain Managment: Goal: General experience of comfort will improve Outcome: Completed/Met   Problem: Elimination: Goal: Will not experience complications related to urinary retention Outcome: Completed/Met   Problem: Elimination: Goal: Will not experience complications related to bowel motility Outcome: Completed/Met   Problem: Nutrition: Goal: Adequate nutrition will be maintained Outcome: Completed/Met   

## 2021-03-12 NOTE — Progress Notes (Signed)
FPTS Interim Night Progress Note  S:Patient sleeping comfortably.  Rounded with primary night RN. Reports that was not aware of patients discharge and had called CSW who confirmed that patient had not been accepted back previous facility.   No other concerns voiced.    O: Today's Vitals   03/11/21 1000 03/11/21 2024 03/12/21 0214 03/12/21 0523  BP:  (!) 133/95  (!) 100/57  Pulse:  94  75  Resp:  18  18  Temp:  98 F (36.7 C)  98 F (36.7 C)  TempSrc:  Oral  Oral  SpO2:  98%  98%  Weight:   60.6 kg   Height:      PainSc: 0-No pain         A/P: Cancelled D/C orders Follow up with CSW in am  Dana Allan MD PGY-3, [facility] Family Medicine Service pager [phone]

## 2021-03-12 NOTE — TOC Transition Note (Addendum)
Transition of Care [facility]) - CM/SW Discharge Note   Patient Details  Name: Mark Moore MRN: [id number] Date of Birth: 1929-02-23  Transition of [facility] CM/SW Contact:  Lynett Grimes Phone Number: 03/12/2021, 3:17 PM   Clinical Narrative:    Patient will DC to: [place] Anticipated DC date: 03/12/2021 Family notified: Pt daughter Transport by: Pt daughter   Per MD patient ready for DC to [place] room [address]. RN to call report prior to discharge [phone]). RN, patient, patient's family, and facility notified of DC. Discharge Summary and FL2 sent to facility. DC packet on chart.    CSW will sign off for now as social work intervention is no longer needed. Please consult [place] again if new needs arise.     Final next level of care: Assisted Living Barriers to Discharge: Continued Medical Work up   Patient Goals and CMS Choice Patient states their goals for this hospitalization and ongoing recovery are:: Return to ALF CMS Medicare.gov Compare Post Acute Care list provided to:: Patient Choice offered to / list presented to : Patient  Discharge Placement                       Discharge Plan and Services In-house Referral: Clinical Social Work   Post Acute Care Choice: NA                               Social Determinants of Health (SDOH) Interventions     Readmission Risk Interventions No flowsheet data found.

## 2021-03-12 NOTE — Progress Notes (Signed)
Family Medicine Teaching Service Daily Progress Note Intern Pager: [phone]  Patient name: Mark Moore Medical record number: [id number] Date of birth: Dec 29, 1928 Age: 87 y.o. Gender: male  Primary Care Provider: Uvaldo Bristle, NP Consultants: None Code Status: DNR  Pt Overview and Major Events to Date:  8/9- admitted   Assessment and Plan: Mark Moore is an 87yo male who presented with AMS, found to have ESBL Klebsiella UTI which has now been adequately treated. PMH significant for dementia, A fib on Pradaxa, HTN, HLD, GERD.   AMS of unknown etiology in the setting of known ESBL Klebsiella Infection Vitals remain stable. Remains without fever or white count. Feels well. A&O. Had planned for discharge back to facility yesterday but they did not accept him back. CSW working on SNF placement for him for PT / OT services - Pending SNF placement - Off contact precautions - PT OT  Dementia No agitation or disorientation. - Continue delirium and fall precautions  A Fib on Pradaxa Rate controlled. - Continue diltiazem 30mg  BID - Continue pradaxa 150mg  BID  HTN, chronic, stable  Well-controlled. - Continue spironolactone 25mg  daily  Glaucoma, chronic, stable - continue alphagan, dorzolamide  BPH, chronic, stable - Continue finasteride 5mg  daily  FEN/GI: Heart healthy PPx: On dabigatran Dispo:SNF today. Barriers include placement .   Subjective:  Mr. remains well-appearing and without complaint.  He has no shortness of breath, pain.  He did not have any episodes of agitation overnight.  He was stable for discharge yesterday and was not accepted back by his previous facility, they requested that he go to SNF for physical and Occupational Therapy.  Objective: Temp:  [98 F (36.7 C)] 98 F (36.7 C) (08/12 0523) Pulse Rate:  [75-94] 75 (08/12 0523) Resp:  [18] 18 (08/12 0523) BP: (100-133)/(57-95) 100/57 (08/12 0523) SpO2:  [98 %] 98 % (08/12 0523) Weight:   [60.6 kg] 60.6 kg (08/12 0214) Physical Exam: General: Well-appearing, sitting up on side of bed, NAD Cardiovascular: Irregular, no murmur, rub, gallop Respiratory: Normal work of breathing on room air, clear to auscultation bilaterally Neuro: Alert and oriented, no focal deficit Extremities: Without edema  Laboratory: Recent Labs  Lab 03/09/21 1121 03/10/21 0219  WBC 5.1 6.6  HGB 11.8* 11.5*  HCT 37.0* 35.1*  PLT 309 294   Recent Labs  Lab 03/09/21 1121 03/10/21 0219  NA 137 136  K 3.5 4.1  CL 106 105  CO2 24 24  BUN 24* 22  CREATININE 0.94 0.88  CALCIUM 9.6 9.5  PROT 6.1*  --   BILITOT 0.3  --   ALKPHOS 84  --   ALT 11  --   AST 13*  --   GLUCOSE 84 80      Imaging/Diagnostic Tests: No new imaging, tests  05/09/21, MD 03/12/2021, 9:51 AM PGY-1, [facility] Medicine FPTS Intern pager: [phone], text pages welcome

## 2021-03-13 ENCOUNTER — Other Ambulatory Visit: Payer: Self-pay | Admitting: Internal Medicine

## 2021-03-14 LAB — CULTURE, BLOOD (ROUTINE X 2)
Culture: NO GROWTH
Culture: NO GROWTH

## 2021-04-30 ENCOUNTER — Ambulatory Visit: Payer: Medicare Other | Admitting: Cardiology

## 2021-05-03 ENCOUNTER — Encounter (HOSPITAL_COMMUNITY): Payer: Self-pay | Admitting: Emergency Medicine

## 2021-05-03 ENCOUNTER — Emergency Department (HOSPITAL_COMMUNITY): Payer: Medicare Other

## 2021-05-03 ENCOUNTER — Other Ambulatory Visit: Payer: Self-pay

## 2021-05-03 ENCOUNTER — Emergency Department (HOSPITAL_COMMUNITY)
Admission: EM | Admit: 2021-05-03 | Discharge: 2021-05-03 | Disposition: A | Discharge status: Home / Self Care | Payer: Medicare Other | Attending: Emergency Medicine | Admitting: Emergency Medicine

## 2021-05-03 DIAGNOSIS — W01198A Fall on same level from slipping, tripping and stumbling with subsequent striking against other object, initial encounter: Secondary | ICD-10-CM | POA: Diagnosis not present

## 2021-05-03 DIAGNOSIS — I10 Essential (primary) hypertension: Secondary | ICD-10-CM | POA: Diagnosis not present

## 2021-05-03 DIAGNOSIS — F0390 Unspecified dementia without behavioral disturbance: Secondary | ICD-10-CM | POA: Diagnosis not present

## 2021-05-03 DIAGNOSIS — S0990XA Unspecified injury of head, initial encounter: Secondary | ICD-10-CM | POA: Diagnosis present

## 2021-05-03 DIAGNOSIS — S0101XA Laceration without foreign body of scalp, initial encounter: Secondary | ICD-10-CM | POA: Diagnosis not present

## 2021-05-03 DIAGNOSIS — W19XXXA Unspecified fall, initial encounter: Secondary | ICD-10-CM

## 2021-05-03 HISTORY — DX: Essential (primary) hypertension: I10

## 2021-05-03 HISTORY — DX: Unspecified glaucoma: H40.9

## 2021-05-03 HISTORY — DX: Unspecified dementia, unspecified severity, without behavioral disturbance, psychotic disturbance, mood disturbance, and anxiety: F03.90

## 2021-05-03 HISTORY — DX: Depression, unspecified: F32.A

## 2021-05-03 HISTORY — DX: Gastro-esophageal reflux disease without esophagitis: K21.9

## 2021-05-03 HISTORY — DX: Hyperlipidemia, unspecified: E78.5

## 2021-05-03 HISTORY — DX: Unspecified atrial fibrillation: I48.91

## 2021-05-03 NOTE — Discharge Instructions (Signed)
Have the staples taken out in around 7-10 days.

## 2021-05-03 NOTE — Progress Notes (Signed)
Orthopedic Tech Progress Note Patient Details:  Mark Moore Aug 06, 1928 [id number]  Patient ID: Mark Moore, male   DOB: 08/01/1929, 87 y.o.   MRN: [id number] Level II trauma; not needed. Darleen Crocker 05/03/2021, 10:55 AM

## 2021-05-03 NOTE — ED Notes (Signed)
RN spoke and updated pt's Daughter, [name].

## 2021-05-03 NOTE — ED Notes (Signed)
RN called [name] at [facility] for pt medical hx. Informed that pt's full name is Mark Moore. Pt is on pradaxa for a-fib.

## 2021-05-03 NOTE — ED Notes (Signed)
Pt's daughter here to pick up pt and take him back to [place] at [place].

## 2021-05-03 NOTE — ED Notes (Signed)
Patient transported to CT 

## 2021-05-03 NOTE — ED Provider Notes (Signed)
[facility] EMERGENCY DEPARTMENT Provider Note   CSN: [id number] Arrival date & time: 05/03/21  1011     History Chief Complaint  Patient presents with   Fall   Patient speaks Spanish primarily.  Education administrator used.  Mark Moore is a 87 y.o. male.  HPI Level 5 caveat due to dementia. Brought in after reported mechanical fall.  Reportedly slipped and hit his head.  Happened this morning.  No other complaints.  Has scalp laceration.  Denies any other complaints.  Patient is on anticoagulation for atrial fibrillation.  Came in as a level 2 trauma.  Past Medical History:  Diagnosis Date   Atrial fibrillation (HCC)    Dementia (HCC)    Depression    Dyslipidemia    GERD (gastroesophageal reflux disease)    Glaucoma    Hypertension     There are no problems to display for this patient.   History reviewed. No pertinent surgical history.     History reviewed. No pertinent family history.  Social History   Tobacco Use   Smoking status: Never   Smokeless tobacco: Never  Substance Use Topics   Alcohol use: Not Currently   Drug use: Not Currently    Home Medications Prior to Admission medications   Not on File    Allergies    Patient has no known allergies.  Review of Systems   Review of Systems  Unable to perform ROS: Dementia  Constitutional:  Negative for appetite change.   Physical Exam Updated Vital Signs BP 123/65   Pulse 70   Temp 98 F (36.7 C) (Temporal)   Resp 20   Ht 5\' 10"  (1.778 m)   Wt 68.9 kg   SpO2 99%   BMI 21.81 kg/m   Physical Exam Vitals and nursing note reviewed.  HENT:     Head:     Comments: Approximately 3 cm occipital laceration.  Well approximated.  Dried blood tracking forward. Eyes:     Pupils: Pupils are equal, round, and reactive to light.  Cardiovascular:     Rate and Rhythm: Normal rate.  Pulmonary:     Breath sounds: No wheezing or rhonchi.  Abdominal:     Tenderness: There is no  abdominal tenderness.  Musculoskeletal:        General: No tenderness.     Cervical back: Neck supple.  Skin:    General: Skin is warm.     Capillary Refill: Capillary refill takes less than 2 seconds.  Neurological:     Mental Status: He is alert.     Comments: Awake and pleasant.  Mild confusion but at reported baseline.    ED Results / Procedures / Treatments   Labs (all labs ordered are listed, but only abnormal results are displayed) Labs Reviewed - No data to display  EKG None  Radiology CT HEAD WO CONTRAST ( )  Result Date: 05/03/2021 CLINICAL DATA:  Fall. On blood thinners. Patient reportedly hit his head. EXAM: CT HEAD WITHOUT CONTRAST TECHNIQUE: Contiguous axial images were obtained from the base of the skull through the vertex without intravenous contrast. COMPARISON:  None. FINDINGS: Brain: No evidence of acute infarction, hemorrhage, hydrocephalus, extra-axial collection or mass lesion/mass effect. There is ventricular and sulcal enlargement reflecting age related volume loss. Bilateral white matter hypoattenuation is noted consistent with moderate chronic microvascular ischemic change. Vascular: No hyperdense vessel or unexpected calcification. Skull: Normal. Negative for fracture or focal lesion. Sinuses/Orbits: Globes and orbits are unremarkable. Opacified posterior right  ethmoid air cell. Remaining sinuses are clear. Other: Left parietal scalp laceration.  No radiopaque foreign body. IMPRESSION: 1. No acute intracranial abnormalities. 2. Age related volume loss and moderate chronic microvascular ischemic change. 3. Left parietal scalp laceration.  No underlying fracture. Electronically Signed   By: Amie Portland M.D.   On: 05/03/2021 11:16    Procedures .Marland KitchenLaceration Repair  Date/Time: 05/03/2021 1:15 PM Performed by: Benjiman Core, MD Authorized by: Benjiman Core, MD   Consent:    Consent obtained:  Verbal   Consent given by:  Patient   Risks, benefits,  and alternatives were discussed: yes     Risks discussed:  Infection, pain, retained foreign body, need for additional repair and nerve damage   Alternatives discussed:  Delayed treatment, observation and no treatment Anesthesia:    Anesthesia method:  None Laceration details:    Location:  Scalp   Scalp location:  Occipital   Length (cm):  3 Exploration:    Limited defect created (wound extended): no     Hemostasis achieved with:  Direct pressure   Imaging outcome: foreign body not noted   Treatment:    Area cleansed with:  Saline   Amount of cleaning:  Standard Skin repair:    Repair method:  Staples   Number of staples:  3 Approximation:    Approximation:  Close Post-procedure details:    Procedure completion:  Tolerated well, no immediate complications   Medications Ordered in ED Medications - No data to display  ED Course  I have reviewed the triage vital signs and the nursing notes.  Pertinent labs & imaging results that were available during my care of the patient were reviewed by me and considered in my medical decision making (see chart for details).    MDM Rules/Calculators/A&P                           Patient withFall.  Reportedly had a mechanical fall and hit his head.  CT scan done and reassuring.  Laceration on scalp closed with staples.  Discharge home.  Staples out in 7 to 10 days.  Tetanus is reportedly up-to-date.  No other apparent injury.  Reported at baseline mental status.  Will not check blood work or further work-up for this reported mechanical fall Final Clinical Impression(s) / ED Diagnoses Final diagnoses:  Fall, initial encounter  Minor head injury, initial encounter  Laceration of scalp, initial encounter    Rx / DC Orders ED Discharge Orders     None        Benjiman Core, MD 05/03/21 1316

## 2021-05-03 NOTE — ED Notes (Signed)
RN reviewed discharge instructions w/ pt's daughter. Follow up reviewed, no further questions.  

## 2021-05-03 NOTE — ED Notes (Signed)
3 staples placed by Dr. Rubin Payor. Interpreter used to explain procedure.

## 2021-05-03 NOTE — ED Triage Notes (Signed)
Pt arrives [facility] from [facility] [place] for as a level 2 trauma, fall on thinners. Pt fell at some point during the night and has lac to posterior head. Pt felt fine and didn't want to come to hospital so didn't tell anyone, staff noticed lac this morning. Pt was ambulatory, no complaints w/ EMS. [facility], spanish speaking

## 2021-05-03 NOTE — ED Notes (Signed)
Pt ambulated to bathroom w/ walker. PT given food and water.  Daughter at bedside

## 2021-05-04 ENCOUNTER — Encounter (HOSPITAL_COMMUNITY): Payer: Self-pay | Admitting: Emergency Medicine

## 2021-05-06 ENCOUNTER — Encounter (HOSPITAL_COMMUNITY): Payer: Self-pay

## 2021-05-06 ENCOUNTER — Inpatient Hospital Stay (HOSPITAL_COMMUNITY)
Admission: EM | Admit: 2021-05-06 | Discharge: 2021-05-10 | DRG: 690 | Disposition: A | Discharge status: Nursing Home (Includes ICF) | Payer: Medicare Other | Source: Skilled Nursing Facility | Attending: Family Medicine | Admitting: Family Medicine

## 2021-05-06 ENCOUNTER — Other Ambulatory Visit: Payer: Self-pay

## 2021-05-06 ENCOUNTER — Observation Stay (HOSPITAL_COMMUNITY): Payer: Medicare Other

## 2021-05-06 DIAGNOSIS — F0390 Unspecified dementia without behavioral disturbance: Secondary | ICD-10-CM | POA: Diagnosis present

## 2021-05-06 DIAGNOSIS — E876 Hypokalemia: Secondary | ICD-10-CM | POA: Diagnosis present

## 2021-05-06 DIAGNOSIS — N400 Enlarged prostate without lower urinary tract symptoms: Secondary | ICD-10-CM | POA: Diagnosis present

## 2021-05-06 DIAGNOSIS — R8281 Pyuria: Secondary | ICD-10-CM | POA: Diagnosis present

## 2021-05-06 DIAGNOSIS — Z7901 Long term (current) use of anticoagulants: Secondary | ICD-10-CM

## 2021-05-06 DIAGNOSIS — R413 Other amnesia: Secondary | ICD-10-CM

## 2021-05-06 DIAGNOSIS — Z79899 Other long term (current) drug therapy: Secondary | ICD-10-CM

## 2021-05-06 DIAGNOSIS — K219 Gastro-esophageal reflux disease without esophagitis: Secondary | ICD-10-CM | POA: Diagnosis present

## 2021-05-06 DIAGNOSIS — R4182 Altered mental status, unspecified: Secondary | ICD-10-CM | POA: Diagnosis not present

## 2021-05-06 DIAGNOSIS — Z888 Allergy status to other drugs, medicaments and biological substances status: Secondary | ICD-10-CM

## 2021-05-06 DIAGNOSIS — H409 Unspecified glaucoma: Secondary | ICD-10-CM | POA: Diagnosis present

## 2021-05-06 DIAGNOSIS — B961 Klebsiella pneumoniae [K. pneumoniae] as the cause of diseases classified elsewhere: Secondary | ICD-10-CM | POA: Diagnosis present

## 2021-05-06 DIAGNOSIS — Z20822 Contact with and (suspected) exposure to covid-19: Secondary | ICD-10-CM | POA: Diagnosis present

## 2021-05-06 DIAGNOSIS — N390 Urinary tract infection, site not specified: Principal | ICD-10-CM | POA: Diagnosis present

## 2021-05-06 DIAGNOSIS — I10 Essential (primary) hypertension: Secondary | ICD-10-CM | POA: Diagnosis present

## 2021-05-06 DIAGNOSIS — E785 Hyperlipidemia, unspecified: Secondary | ICD-10-CM | POA: Diagnosis present

## 2021-05-06 DIAGNOSIS — Z23 Encounter for immunization: Secondary | ICD-10-CM

## 2021-05-06 DIAGNOSIS — R269 Unspecified abnormalities of gait and mobility: Secondary | ICD-10-CM | POA: Diagnosis present

## 2021-05-06 DIAGNOSIS — Z66 Do not resuscitate: Secondary | ICD-10-CM | POA: Diagnosis present

## 2021-05-06 DIAGNOSIS — Z87440 Personal history of urinary (tract) infections: Secondary | ICD-10-CM

## 2021-05-06 DIAGNOSIS — Z1612 Extended spectrum beta lactamase (ESBL) resistance: Secondary | ICD-10-CM | POA: Diagnosis present

## 2021-05-06 DIAGNOSIS — I4891 Unspecified atrial fibrillation: Secondary | ICD-10-CM | POA: Diagnosis present

## 2021-05-06 DIAGNOSIS — R296 Repeated falls: Secondary | ICD-10-CM | POA: Diagnosis present

## 2021-05-06 DIAGNOSIS — H9190 Unspecified hearing loss, unspecified ear: Secondary | ICD-10-CM | POA: Diagnosis present

## 2021-05-06 DIAGNOSIS — R4189 Other symptoms and signs involving cognitive functions and awareness: Secondary | ICD-10-CM | POA: Diagnosis present

## 2021-05-06 DIAGNOSIS — I482 Chronic atrial fibrillation, unspecified: Secondary | ICD-10-CM | POA: Diagnosis present

## 2021-05-06 LAB — CBC WITH DIFFERENTIAL/PLATELET
Abs Immature Granulocytes: 0.03 10*3/uL (ref 0.00–0.07)
Basophils Absolute: 0.1 10*3/uL (ref 0.0–0.1)
Basophils Relative: 1 %
Eosinophils Absolute: 0.2 10*3/uL (ref 0.0–0.5)
Eosinophils Relative: 4 %
HCT: 37.8 % — ABNORMAL LOW (ref 39.0–52.0)
Hemoglobin: 12.2 g/dL — ABNORMAL LOW (ref 13.0–17.0)
Immature Granulocytes: 1 %
Lymphocytes Relative: 26 %
Lymphs Abs: 1.6 10*3/uL (ref 0.7–4.0)
MCH: 33 pg (ref 26.0–34.0)
MCHC: 32.3 g/dL (ref 30.0–36.0)
MCV: 102.2 fL — ABNORMAL HIGH (ref 80.0–100.0)
Monocytes Absolute: 0.5 10*3/uL (ref 0.1–1.0)
Monocytes Relative: 9 %
Neutro Abs: 3.6 10*3/uL (ref 1.7–7.7)
Neutrophils Relative %: 59 %
Platelets: 276 10*3/uL (ref 150–400)
RBC: 3.7 MIL/uL — ABNORMAL LOW (ref 4.22–5.81)
RDW: 14.2 % (ref 11.5–15.5)
WBC: 6 10*3/uL (ref 4.0–10.5)
nRBC: 0 % (ref 0.0–0.2)

## 2021-05-06 LAB — COMPREHENSIVE METABOLIC PANEL
ALT: 14 U/L (ref 0–44)
AST: 18 U/L (ref 15–41)
Albumin: 3.9 g/dL (ref 3.5–5.0)
Alkaline Phosphatase: 64 U/L (ref 38–126)
Anion gap: 7 (ref 5–15)
BUN: 30 mg/dL — ABNORMAL HIGH (ref 8–23)
CO2: 25 mmol/L (ref 22–32)
Calcium: 9.8 mg/dL (ref 8.9–10.3)
Chloride: 106 mmol/L (ref 98–111)
Creatinine, Ser: 0.94 mg/dL (ref 0.61–1.24)
GFR, Estimated: >60 mL/min (ref >60)
Glucose, Bld: 103 mg/dL — ABNORMAL HIGH (ref 70–99)
Potassium: 4.4 mmol/L (ref 3.5–5.1)
Sodium: 138 mmol/L (ref 135–145)
Total Bilirubin: 0.7 mg/dL (ref 0.3–1.2)
Total Protein: 6.4 g/dL — ABNORMAL LOW (ref 6.5–8.1)

## 2021-05-06 LAB — RESP PANEL BY RT-PCR (FLU A&B, COVID) ARPGX2
Influenza A by PCR: NEGATIVE
Influenza B by PCR: NEGATIVE
SARS Coronavirus 2 by RT PCR: NEGATIVE

## 2021-05-06 MED ORDER — SODIUM CHLORIDE 0.9 % IV SOLN
1.0000 g | Freq: Three times a day (TID) | INTRAVENOUS | Status: DC
  Administered 2021-05-06 – 2021-05-08 (×5): 1 g via INTRAVENOUS
  Filled 2021-05-06 (×8): qty 1

## 2021-05-06 NOTE — ED Provider Notes (Signed)
[facility] EMERGENCY DEPARTMENT Provider Note   CSN: [id number] Arrival date & time: 05/06/21  1554     History No chief complaint on file.   Mark Moore is a 87 y.o. male with PMH frequent ESBL UTIs, A. fib, BPH, dementia who presents from his skilled nursing facility due to concern for approximately 1 week of worsening confusion and concern for UTI.  Patient received blood cultures that returned on 05/03/2021 positive for greater than 100,000 units of Klebsiella pneumonia ESBL sensitive only to Zosyn.  on previous culture review, old cultures were not sensitive to Zosyn.  On initial arrival, patient is altered and not answering questions.  Patient is hard of hearing but history obtained from daughter who says that the patient traditionally gets very altered in the setting of a urinary tract infection and this has been progressively worsening over the course of 7 days.  Patient arrives hemodynamically stable and additional review of systems unable to be obtained due to patient's mental status.  HPI     Past Medical History:  Diagnosis Date   Atrial fibrillation (HCC)    Atrial fibrillation, chronic (HCC)    BPH (benign prostatic hyperplasia)    Dementia (HCC)    Depression    DNR (do not resuscitate) 01/17/2021   Dyslipidemia    GERD (gastroesophageal reflux disease)    Glaucoma    Hypertension     Patient Active Problem List   Diagnosis Date Noted   ESBL (extended spectrum beta-lactamase) producing bacteria infection 03/09/2021   Altered mental status    Acute encephalopathy 01/18/2021   Acute metabolic encephalopathy 01/17/2021   Atrial fibrillation, chronic (HCC) 01/17/2021   Fall (on)(from) sidewalk curb, initial encounter 01/17/2021   Dementia (HCC) 01/17/2021   Depression 01/17/2021   Dyslipidemia 01/17/2021   Glaucoma 01/17/2021   Thoracic compression fracture, closed, initial encounter (HCC) 01/17/2021   DNR (do not resuscitate)  01/17/2021   Hyperlipidemia 12/03/2020   Hypertension 12/02/2020   Glaucoma 12/02/2020   Depression 12/02/2020   GERD (gastroesophageal reflux disease) 12/02/2020   BPH (benign prostatic hyperplasia) 12/02/2020   Atrial fibrillation (HCC) 12/02/2020    Past Surgical History:  Procedure Laterality Date   NO PAST SURGERIES     none         History reviewed. No pertinent family history.  Social History   Tobacco Use   Smoking status: Never   Smokeless tobacco: Never  Vaping Use   Vaping Use: Never used  Substance Use Topics   Alcohol use: Not Currently   Drug use: Not Currently    Home Medications Prior to Admission medications   Medication Sig Start Date End Date Taking? Authorizing Provider  acetaminophen (TYLENOL) 500 MG tablet Take 1,000 mg by mouth 3 (three) times daily.    [provider]  atorvastatin (LIPITOR) 10 MG tablet Take 1 tablet (10 mg total) by mouth daily. 12/02/20   Wanda Plump, MD  brimonidine (ALPHAGAN) 0.2 % ophthalmic solution Place 1 drop into both eyes 3 (three) times daily. Patient taking differently: Place 1 drop into both eyes 2 (two) times daily. 12/03/20   Wanda Plump, MD  dabigatran (PRADAXA) 150 MG CAPS capsule Take 1 capsule (150 mg total) by mouth 2 (two) times daily. 12/02/20   Wanda Plump, MD  diltiazem (CARDIZEM) 30 MG tablet Take 1 tablet (30 mg total) by mouth in the morning and at bedtime. 12/02/20   Wanda Plump, MD  dorzolamide (TRUSOPT) 2 %  ophthalmic solution Place 1 drop into both eyes 2 (two) times daily. 12/03/20   Wanda Plump, MD  finasteride (PROSCAR) 5 MG tablet Take 1 tablet (5 mg total) by mouth daily. 12/02/20   Wanda Plump, MD  pantoprazole (PROTONIX) 40 MG tablet Take 1 tablet (40 mg total) by mouth daily. 01/08/21   Wanda Plump, MD  spironolactone (ALDACTONE) 25 MG tablet Take 1 tablet (25 mg total) by mouth daily. 03/01/21   Wanda Plump, MD    Allergies    Wellbutrin [bupropion]  Review of Systems   Review of Systems   Unable to perform ROS: Mental status change   Physical Exam Updated Vital Signs BP 116/69   Pulse 78   Temp (!) 97 F (36.1 C) (Oral)   Resp 20   Ht 5\' 10"  (1.778 m)   Wt 68.9 kg   SpO2 99%   BMI 21.81 kg/m   Physical Exam Vitals and nursing note reviewed.  Constitutional:      Appearance: He is well-developed.  HENT:     Head: Normocephalic and atraumatic.  Eyes:     Conjunctiva/sclera: Conjunctivae normal.  Cardiovascular:     Rate and Rhythm: Normal rate and regular rhythm.     Heart sounds: No murmur heard. Pulmonary:     Effort: Pulmonary effort is normal. No respiratory distress.     Breath sounds: Normal breath sounds.  Abdominal:     Palpations: Abdomen is soft.     Tenderness: There is no abdominal tenderness.  Musculoskeletal:     Cervical back: Neck supple.  Skin:    General: Skin is warm and dry.  Neurological:     Mental Status: He is alert. He is disoriented.    ED Results / Procedures / Treatments   Labs (all labs ordered are listed, but only abnormal results are displayed) Labs Reviewed  COMPREHENSIVE METABOLIC PANEL - Abnormal; Notable for the following components:      Result Value   Glucose, Bld 103 (*)    BUN 30 (*)    Total Protein 6.4 (*)    All other components within normal limits  CBC WITH DIFFERENTIAL/PLATELET - Abnormal; Notable for the following components:   RBC 3.70 (*)    Hemoglobin 12.2 (*)    HCT 37.8 (*)    MCV 102.2 (*)    All other components within normal limits  URINE CULTURE  URINALYSIS, ROUTINE W REFLEX MICROSCOPIC    EKG None  Radiology No results found.  Procedures Procedures   Medications Ordered in ED Medications  meropenem (MERREM) 1 g in sodium chloride 0.9 % 100 mL IVPB (has no administration in time range)    ED Course  I have reviewed the triage vital signs and the nursing notes.  Pertinent labs & imaging results that were available during my care of the patient were reviewed by me and  considered in my medical decision making (see chart for details).    MDM Rules/Calculators/A&P                           Patient seen the emergency department for evaluation of altered mental status and outside laboratory confirmed a urinary tract infection.  Laboratory evaluation unremarkable with no significant leukocytosis but the patient is significantly altered.  Physical exam largely unremarkable.  In reviewing old culture data, it appears the best antibiotic for this patient at this time is meropenem and we will start  this here in the emergency department.  He may be able to to be de-escalated to ertapenem according to the outside data report and I will leave this to the inpatient team. Pt Then admitted. Final Clinical Impression(s) / ED Diagnoses Final diagnoses:  None    Rx / DC Orders ED Discharge Orders     None        Mileena Rothenberger, Wyn Forster, MD 05/06/21 [phone]

## 2021-05-06 NOTE — Progress Notes (Signed)
Pharmacy Antibiotic Note  Mark Moore is a 87 y.o. male admitted on 05/06/2021 with ESBL UTI. Pharmacy has been consulted for meropenem dosing.  Patient presents from outside hospital with urine cx positive for ESBL K. pneumo, which was also isolated in June of 2022. Afebrile with WBC 6.0 and SCr 0.94.  Patient has encephalopathy and his a poor historian, so exact symptoms are unclear.   Plan: Start meropenem 1 g IV q8h F/u on symptoms, cultures, and labs  Height: 5\' 10"  (177.8 cm) Weight: 68.9 kg (152 lb) IBW/kg (Calculated) : 73  No data recorded.  No results for input(s): WBC, CREATININE, LATICACIDVEN, VANCOTROUGH, VANCOPEAK, VANCORANDOM, GENTTROUGH, GENTPEAK, GENTRANDOM, TOBRATROUGH, TOBRAPEAK, TOBRARND, AMIKACINPEAK, AMIKACINTROU, AMIKACIN in the last 168 hours.  CrCl cannot be calculated (Patient's most recent lab result is older than the maximum 21 days allowed.).    Allergies  Allergen Reactions   Wellbutrin [Bupropion] Other (See Comments)    XL 150 mg- Made the patient feel paranoid and have borderline hallucinations    Antimicrobials this admission: Meropenem 10/6>>  Microbiology results: 10/6 UCx: in process   Thank you for allowing pharmacy to be a part of this patient's care.  12/6 05/06/2021 4:54 PM

## 2021-05-06 NOTE — H&P (Addendum)
Family Medicine Teaching [facility] Admission History and Physical Service Pager: [phone]  Patient name: Mark Moore Medical record number: [id number] Date of birth: 04/05/1929 Age: 87 y.o. Gender: male  Primary Care Provider: Uvaldo Bristle, NP Consultants: None Code Status: DNR Preferred Emergency Contact: Daughter Myrle Sheng [phone]  Chief Complaint: AMS  Assessment and Plan: Mark Moore is a 87 y.o. male presenting from extended care facility with altered mental status. PMH is significant for dementia, atrial fibrillation on Pradaxa, hypertension, hyperlipidemia, GERD, hearing difficulty.  Altered mental status of unknown etiology in the setting of known ESBL Klebsiella infection Patient presents from SNF due to concern for 1 week of worsening confusion and concern for UTI.  Per report from ED provider, history largely obtained from daughter who says that patient traditionally gets altered in setting of UTI this has been progressively worsening over the course of 7 days.  No more falls since 10/3.  He was recently seen in the ED on 10/3 for mechanical fall with head trauma but remained at his neurologic baseline.  CT head negative.  Laceration was closed with staples.  He had urine cultures collected at that time which then grew Klebsiella greater than 100,000 CFU.  He was also previously admitted in August 2022 for altered mental status with ESBL UTI treated with meropenem in which he remained stable and afebrile throughout admission. Patient arrives hemodynamically stable with BP 121/77, 97.0 F, 78 HR, 20 RR.  He does not meet sepsis criteria at this time, and labs are reassuring.  Urine culture obtained in the ED, pending results.  CBC and CMP obtained in the ED largely within normal limits:  Na+ 138, K+ 4.4, glucose 103, Cr. 0.94, WBC 6.0, hemoglobin 12.2.  Meropenem started in the ED given previous culture reviewed old culture is not sensitive to Zosyn.   Given his AMS, CT head was obtained and negative for acute findings.  Reversible causes of AMS also evaluated for with TSH, B12, HIV, RPR.   -Admit to med tele, Dr. Deretha Emory attending -IV Meropenem per pharmacy -Follow-up blood and urine cultures -Follow-up on reversible causes of AMS: TSH, B12, RPR, HIV -PT OT evaluation and treat -Neurochecks every 2 hours - VS per unit protocol   Reported dementia Patient has dementia at baseline.  He performs ADLs independently.  Per chart review, he has had issues with sundowning in the past and required Haldol before being started on nightly Seroquel -Delirium precautions -Fall precautions -Seroquel p.o. 25 mg nightly PRN  Atrial fibrillation, anticoagulated on dabigatran Rate controlled today.  In atrial fibrillation on admission EKG -Continue dabigatran 150 mg twice daily -Continue diltiazem 30 mg twice daily - cardiac monitoring  Hypertension Chronic and stable.  BP well controlled on admission, 121/77. K+ 4.1 -Continue spironolactone 25 mg daily -monitor BP w/ VS  Hyperlipidemia Chronic and stable.   -Continue atorvastatin 10 mg daily  GERD Chronic and stable -Continue Protonix 40 mg daily  BPH Chronic and stable -Continue finasteride 5 mg daily  FEN/GI: Heart healthy diet Prophylaxis: On full dose dabigatran  Disposition: Progressive  History of Present Illness:  Mark Moore is a 87 y.o. male presenting from ALF for altered mental status. Patient too altered to provide history, and daughter not at bedside.  Per discussion with ED provider, patient was evaluated on 10/3 in ED after a fall and urine cultures from that encounter were positive for EBSL sensitive to Zocysn. Patient is Spanish speaking and demented so unable to get  direct history from him. Nursing facility reports patient grew increasingly confused in last few days. The patient denies presence of pain or difficulty breathing on admission.    Review Of  Systems:   Review of Systems  Reason unable to perform ROS: AMS.    Patient Active Problem List   Diagnosis Date Noted   ESBL (extended spectrum beta-lactamase) producing bacteria infection 03/09/2021   Altered mental status    Acute encephalopathy 01/18/2021   Acute metabolic encephalopathy 01/17/2021   Atrial fibrillation, chronic (HCC) 01/17/2021   Fall (on)(from) sidewalk curb, initial encounter 01/17/2021   Dementia (HCC) 01/17/2021   Depression 01/17/2021   Dyslipidemia 01/17/2021   Glaucoma 01/17/2021   Thoracic compression fracture, closed, initial encounter (HCC) 01/17/2021   DNR (do not resuscitate) 01/17/2021   Hyperlipidemia 12/03/2020   Hypertension 12/02/2020   Glaucoma 12/02/2020   Depression 12/02/2020   GERD (gastroesophageal reflux disease) 12/02/2020   BPH (benign prostatic hyperplasia) 12/02/2020   Atrial fibrillation (HCC) 12/02/2020    Past Medical History: Past Medical History:  Diagnosis Date   Atrial fibrillation (HCC)    Atrial fibrillation, chronic (HCC)    BPH (benign prostatic hyperplasia)    Dementia (HCC)    Depression    DNR (do not resuscitate) 01/17/2021   Dyslipidemia    GERD (gastroesophageal reflux disease)    Glaucoma    Hypertension     Past Surgical History: Past Surgical History:  Procedure Laterality Date   NO PAST SURGERIES     none      Social History: Social History   Tobacco Use   Smoking status: Never   Smokeless tobacco: Never  Vaping Use   Vaping Use: Never used  Substance Use Topics   Alcohol use: Not Currently   Drug use: Not Currently   Family History: History reviewed. No pertinent family history.  Allergies and Medications: Allergies  Allergen Reactions   Wellbutrin [Bupropion] Other (See Comments)    XL 150 mg- Made the patient feel paranoid and have borderline hallucinations   No current facility-administered medications on file prior to encounter.   Current Outpatient Medications on File  Prior to Encounter  Medication Sig Dispense Refill   acetaminophen (TYLENOL) 500 MG tablet Take 1,000 mg by mouth 3 (three) times daily.     atorvastatin (LIPITOR) 10 MG tablet Take 1 tablet (10 mg total) by mouth daily. 90 tablet 0   brimonidine (ALPHAGAN) 0.2 % ophthalmic solution Place 1 drop into both eyes 3 (three) times daily. (Patient taking differently: Place 1 drop into both eyes 2 (two) times daily.) 10 mL 0   dabigatran (PRADAXA) 150 MG CAPS capsule Take 1 capsule (150 mg total) by mouth 2 (two) times daily. 180 capsule 0   diltiazem (CARDIZEM) 30 MG tablet Take 1 tablet (30 mg total) by mouth in the morning and at bedtime. 90 tablet 0   dorzolamide (TRUSOPT) 2 % ophthalmic solution Place 1 drop into both eyes 2 (two) times daily. 10 mL 0   finasteride (PROSCAR) 5 MG tablet Take 1 tablet (5 mg total) by mouth daily. 90 tablet 0   pantoprazole (PROTONIX) 40 MG tablet Take 1 tablet (40 mg total) by mouth daily. 90 tablet 1   spironolactone (ALDACTONE) 25 MG tablet Take 1 tablet (25 mg total) by mouth daily. 90 tablet 0    Objective: BP 130/80   Pulse 81   Temp (!) 97 F (36.1 C) (Oral)   Resp 20   Ht 5\' 10"  (1.778  m)   Wt 68.9 kg   SpO2 99%   BMI 21.81 kg/m  Exam: General: Elderly male, appears stated age, laying in ED stretcher Eyes: EOMI, PERRLA, anicteric sclera ENTM: Mucous membranes moist, good dentition, no pharyngeal erythema or exudate. Neck: No cervical or mandibular lymphadenopathy.  Normal range of motion.  No nuchal rigidity. Cardiovascular: Irregularly irregular, no murmur Respiratory: No respiratory distress.  Normal work of breathing on room air.  Lungs clear in all fields to auscultation posteriorly. Gastrointestinal: Soft, nontender, nondistended.  No CVA tenderness. GU: Primofit catheter in place. No erythema or drainage from genital region. MSK: No major joint deformities Derm: Venous stasis changes of bilateral ankles.  No bilateral lower extremity  edema. Neuro: Alert. Not oriented, unwilling to answer questions Psych: Normal mood and affect.  Labs and Imaging: CBC BMET  Recent Labs  Lab 05/06/21 1610  WBC 6.0  HGB 12.2*  HCT 37.8*  PLT 276   Recent Labs  Lab 05/06/21 1610  NA 138  K 4.4  CL 106  CO2 25  BUN 30*  CREATININE 0.94  GLUCOSE 103*  CALCIUM 9.8      Darral Dash, DO 05/06/2021, 8:06 PM PGY-1, [facility] FPTS Intern pager: [phone], text pages welcome   FPTS Upper-Level Resident Addendum   I have independently interviewed and examined the patient. I have discussed the above with Dr.Dameron and agree with the documented plan. My edits for correction/addition/clarification are included above. Please see any attending notes.   Ronnald Ramp, MD PGY-3, [facility] 05/07/2021 6:04 AM  FPTS Service pager: [phone] (text pages welcome through [facility])

## 2021-05-06 NOTE — ED Triage Notes (Signed)
Pt reports via [facility] for positive urine cultures (Klebsiella pneumoniae). Pt spanish speaking. NAD noted.

## 2021-05-07 DIAGNOSIS — R269 Unspecified abnormalities of gait and mobility: Secondary | ICD-10-CM | POA: Diagnosis present

## 2021-05-07 DIAGNOSIS — R4189 Other symptoms and signs involving cognitive functions and awareness: Secondary | ICD-10-CM | POA: Diagnosis not present

## 2021-05-07 DIAGNOSIS — N400 Enlarged prostate without lower urinary tract symptoms: Secondary | ICD-10-CM | POA: Diagnosis present

## 2021-05-07 DIAGNOSIS — R296 Repeated falls: Secondary | ICD-10-CM | POA: Diagnosis present

## 2021-05-07 DIAGNOSIS — K219 Gastro-esophageal reflux disease without esophagitis: Secondary | ICD-10-CM | POA: Diagnosis present

## 2021-05-07 DIAGNOSIS — N390 Urinary tract infection, site not specified: Secondary | ICD-10-CM | POA: Insufficient documentation

## 2021-05-07 DIAGNOSIS — F0390 Unspecified dementia without behavioral disturbance: Secondary | ICD-10-CM | POA: Diagnosis present

## 2021-05-07 DIAGNOSIS — H9190 Unspecified hearing loss, unspecified ear: Secondary | ICD-10-CM | POA: Diagnosis present

## 2021-05-07 DIAGNOSIS — Z1612 Extended spectrum beta lactamase (ESBL) resistance: Secondary | ICD-10-CM | POA: Diagnosis present

## 2021-05-07 DIAGNOSIS — Z66 Do not resuscitate: Secondary | ICD-10-CM | POA: Diagnosis present

## 2021-05-07 DIAGNOSIS — I482 Chronic atrial fibrillation, unspecified: Secondary | ICD-10-CM | POA: Diagnosis present

## 2021-05-07 DIAGNOSIS — Z7901 Long term (current) use of anticoagulants: Secondary | ICD-10-CM | POA: Diagnosis not present

## 2021-05-07 DIAGNOSIS — R4182 Altered mental status, unspecified: Secondary | ICD-10-CM | POA: Diagnosis present

## 2021-05-07 DIAGNOSIS — R410 Disorientation, unspecified: Secondary | ICD-10-CM | POA: Diagnosis not present

## 2021-05-07 DIAGNOSIS — Z888 Allergy status to other drugs, medicaments and biological substances status: Secondary | ICD-10-CM | POA: Diagnosis not present

## 2021-05-07 DIAGNOSIS — Z23 Encounter for immunization: Secondary | ICD-10-CM | POA: Diagnosis present

## 2021-05-07 DIAGNOSIS — R8281 Pyuria: Secondary | ICD-10-CM | POA: Diagnosis not present

## 2021-05-07 DIAGNOSIS — E785 Hyperlipidemia, unspecified: Secondary | ICD-10-CM | POA: Diagnosis present

## 2021-05-07 DIAGNOSIS — E876 Hypokalemia: Secondary | ICD-10-CM | POA: Diagnosis present

## 2021-05-07 DIAGNOSIS — H409 Unspecified glaucoma: Secondary | ICD-10-CM | POA: Diagnosis present

## 2021-05-07 DIAGNOSIS — Z87440 Personal history of urinary (tract) infections: Secondary | ICD-10-CM | POA: Diagnosis not present

## 2021-05-07 DIAGNOSIS — I10 Essential (primary) hypertension: Secondary | ICD-10-CM | POA: Diagnosis present

## 2021-05-07 DIAGNOSIS — Z20822 Contact with and (suspected) exposure to covid-19: Secondary | ICD-10-CM | POA: Diagnosis present

## 2021-05-07 DIAGNOSIS — Z79899 Other long term (current) drug therapy: Secondary | ICD-10-CM | POA: Diagnosis not present

## 2021-05-07 DIAGNOSIS — B961 Klebsiella pneumoniae [K. pneumoniae] as the cause of diseases classified elsewhere: Secondary | ICD-10-CM | POA: Diagnosis present

## 2021-05-07 LAB — TSH: TSH: 1.631 u[IU]/mL (ref 0.350–4.500)

## 2021-05-07 LAB — CBC
HCT: 36.6 % — ABNORMAL LOW (ref 39.0–52.0)
Hemoglobin: 11.9 g/dL — ABNORMAL LOW (ref 13.0–17.0)
MCH: 33.2 pg (ref 26.0–34.0)
MCHC: 32.5 g/dL (ref 30.0–36.0)
MCV: 102.2 fL — ABNORMAL HIGH (ref 80.0–100.0)
Platelets: 250 10*3/uL (ref 150–400)
RBC: 3.58 MIL/uL — ABNORMAL LOW (ref 4.22–5.81)
RDW: 14.1 % (ref 11.5–15.5)
WBC: 7.6 10*3/uL (ref 4.0–10.5)
nRBC: 0 % (ref 0.0–0.2)

## 2021-05-07 LAB — URINALYSIS, ROUTINE W REFLEX MICROSCOPIC
Bilirubin Urine: NEGATIVE
Glucose, UA: NEGATIVE mg/dL
Hgb urine dipstick: NEGATIVE
Ketones, ur: NEGATIVE mg/dL
Nitrite: NEGATIVE
Protein, ur: NEGATIVE mg/dL
Specific Gravity, Urine: 1.008 (ref 1.005–1.030)
WBC, UA: >50 WBC/hpf — ABNORMAL HIGH (ref 0–5)
pH: 6 (ref 5.0–8.0)

## 2021-05-07 LAB — BASIC METABOLIC PANEL
Anion gap: 7 (ref 5–15)
BUN: 23 mg/dL (ref 8–23)
CO2: 24 mmol/L (ref 22–32)
Calcium: 9.7 mg/dL (ref 8.9–10.3)
Chloride: 107 mmol/L (ref 98–111)
Creatinine, Ser: 0.81 mg/dL (ref 0.61–1.24)
GFR, Estimated: >60 mL/min (ref >60)
Glucose, Bld: 80 mg/dL (ref 70–99)
Potassium: 4 mmol/L (ref 3.5–5.1)
Sodium: 138 mmol/L (ref 135–145)

## 2021-05-07 LAB — VITAMIN B12: Vitamin B-12: 240 pg/mL (ref 180–914)

## 2021-05-07 LAB — RAPID URINE DRUG SCREEN, HOSP PERFORMED
Amphetamines: NOT DETECTED
Barbiturates: NOT DETECTED
Benzodiazepines: NOT DETECTED
Cocaine: NOT DETECTED
Opiates: NOT DETECTED
Tetrahydrocannabinol: NOT DETECTED

## 2021-05-07 LAB — RPR: RPR Ser Ql: NONREACTIVE

## 2021-05-07 MED ORDER — BRIMONIDINE TARTRATE 0.2 % OP SOLN
1.0000 [drp] | Freq: Three times a day (TID) | OPHTHALMIC | Status: DC
  Administered 2021-05-07 – 2021-05-10 (×10): 1 [drp] via OPHTHALMIC
  Filled 2021-05-07: qty 5

## 2021-05-07 MED ORDER — DILTIAZEM HCL 60 MG PO TABS
30.0000 mg | ORAL_TABLET | Freq: Two times a day (BID) | ORAL | Status: DC
  Administered 2021-05-07 – 2021-05-08 (×2): 30 mg via ORAL
  Filled 2021-05-07 (×3): qty 1

## 2021-05-07 MED ORDER — FINASTERIDE 5 MG PO TABS
5.0000 mg | ORAL_TABLET | Freq: Every day | ORAL | Status: DC
  Administered 2021-05-07 – 2021-05-10 (×4): 5 mg via ORAL
  Filled 2021-05-07 (×4): qty 1

## 2021-05-07 MED ORDER — DABIGATRAN ETEXILATE MESYLATE 150 MG PO CAPS
150.0000 mg | ORAL_CAPSULE | Freq: Two times a day (BID) | ORAL | Status: DC
  Filled 2021-05-07: qty 1

## 2021-05-07 MED ORDER — DORZOLAMIDE HCL 2 % OP SOLN
1.0000 [drp] | Freq: Two times a day (BID) | OPHTHALMIC | Status: DC
  Administered 2021-05-07 – 2021-05-10 (×7): 1 [drp] via OPHTHALMIC
  Filled 2021-05-07: qty 10

## 2021-05-07 MED ORDER — ACETAMINOPHEN 650 MG RE SUPP: 650.0000 mg | Freq: Four times a day (QID) | RECTAL | Status: DC | PRN

## 2021-05-07 MED ORDER — APIXABAN 5 MG PO TABS
5.0000 mg | ORAL_TABLET | Freq: Two times a day (BID) | ORAL | Status: DC
  Administered 2021-05-07 – 2021-05-10 (×7): 5 mg via ORAL
  Filled 2021-05-07 (×7): qty 1

## 2021-05-07 MED ORDER — SODIUM CHLORIDE 0.9 % IV SOLN: INTRAVENOUS | Status: AC

## 2021-05-07 MED ORDER — ACETAMINOPHEN 325 MG PO TABS
650.0000 mg | ORAL_TABLET | Freq: Four times a day (QID) | ORAL | Status: DC | PRN
  Filled 2021-05-07: qty 2

## 2021-05-07 MED ORDER — PANTOPRAZOLE SODIUM 40 MG PO TBEC
40.0000 mg | DELAYED_RELEASE_TABLET | Freq: Every day | ORAL | Status: DC
  Administered 2021-05-07 – 2021-05-09 (×3): 40 mg via ORAL
  Filled 2021-05-07 (×3): qty 1

## 2021-05-07 MED ORDER — SPIRONOLACTONE 25 MG PO TABS
25.0000 mg | ORAL_TABLET | Freq: Every day | ORAL | Status: DC
  Administered 2021-05-07 – 2021-05-10 (×4): 25 mg via ORAL
  Filled 2021-05-07 (×4): qty 1

## 2021-05-07 MED ORDER — ATORVASTATIN CALCIUM 10 MG PO TABS
10.0000 mg | ORAL_TABLET | Freq: Every day | ORAL | Status: DC
  Administered 2021-05-07 – 2021-05-10 (×4): 10 mg via ORAL
  Filled 2021-05-07 (×4): qty 1

## 2021-05-07 NOTE — Progress Notes (Signed)
Used interpreter to do patient's admission assessment. Patient is pleasantly confused and unable to answer admission questions. Patient is only able to remember his name and that he needs eye drops. Patient states he cannot remember things and he doesn't know why. InterpreterAlphia Kava #[id number].

## 2021-05-07 NOTE — Progress Notes (Signed)
Pt received from ED and settled in bed, alert. VS stable.

## 2021-05-07 NOTE — ED Notes (Signed)
Pt encouraged to provide urine specimen. Primofit remains in place.

## 2021-05-07 NOTE — Hospital Course (Addendum)
Mark Moore is a 87 y.o. male who presented from extended care facility with worsening mental status and concern for UTI. PMH is significant for dementia, atrial fibrillation on Pradaxa, hypertension, hyperlipidemia, GERD, hearing difficulty.  AMS in setting of known Dementia  Patient presented from ALF due to worsening confusion. Patient noted to have dementia at baseline, however, appeared to be worsening after fall three days prior to presenting to the ED a time of admission. Patient was evaluated after a fall 10/3 (negative head CT at that time, repeat head CT is negative). Prior urine cultures were positive for ESBL Klebsiella Pneumoniae in June 2022 with sensitivities to gentamicin and imipenem. Newer outside hospital urine culture noted sensitivities to Zosyn however given resistance from June cultures, he was started on meropenem on 10/6. Vitals at the time of admission were WNL and labs were unremarkable. Patient was unable to recall why he was in the ED nor was he able to answer orientation questions. TSH, and B12 were WBL with a negative UDS. HIV and RPR were negative. U/A on admission notable for large leukocytes and microscopy shows rare bacteria. Urine culture with insignificant growth and abx were discontinued. Mentation improved throughout hospitalization back to baseline. Was evaluated by OT/PT who recommended return to ALF.  Atrial Fibrillation  Intermittent atrial fibrillation throughout hospitalization. Was continued on Eliquis though diltiazem held. Diltiazem was continued at discharge.   Staple removal 3 staples that were on scalp for 7 days after mechanical fall on 10/3 were removed on day of discharge (10/10). They were present for recommended 7 days. Granulation tissue present and sterile technique was used to remove staples. No bleeding.  Other chronic problems stable.

## 2021-05-07 NOTE — ED Notes (Signed)
This RN went in to check on the pt. Pt's daughter is at bedside at this time. Pt's daughter stated that the pt felt like he was wet. Pt had a male purwick in place. This RN checked pt. Pt was correct, purwick had become detached. This RN helped pt get cleaned up. This RN changed pt's gown and sheets. This RN placed a condom cath on the pt in hopes that it would work better for the pt. This RN will continue to monitor.

## 2021-05-07 NOTE — Consult Note (Signed)
[facility]    Date of Admission:  05/06/2021   Total days of antibiotics 2        Day 2 meropenem          ID: Mark Moore is a 87 y.o. male with  dementia, Afib, HTN, and HLD who presented to [facility] for 1 week of worsening confusion and concern for UTI. The patient resides at a SNF and had had repeated episodes of AMS in the setting of questionable UTIs.   Active Problems:   Altered mental status   AMS (altered mental status)    Subjective: Mark Moore is an 87 yo male with dementia, Afib, HTN, and HLD who presented to [facility] for worsening altered mental status and concern for UTI. The patient is a native speaker and was seen with Dr. Luciana Axe, thus a translator was not required during this encounter. The patient states that he does not have any fevers, chills, abd pain, n/v/d, dysuria, hematuria, or any other urinary symptoms. He did note that he urinated in his bed, and was unsure why. When asked where he currently is, the patient states that "we should know the answer to that".    Medications:   apixaban  5 mg Oral BID   atorvastatin  10 mg Oral Daily   brimonidine  1 drop Both Eyes TID   diltiazem  30 mg Oral Q12H   dorzolamide  1 drop Both Eyes BID   finasteride  5 mg Oral Daily   pantoprazole  40 mg Oral Daily   spironolactone  25 mg Oral Daily    Objective: Vital signs in last 24 hours: Temp:  [97 F (36.1 C)] 97 F (36.1 C) (10/06 1701) Pulse Rate:  [28-95] 88 (10/07 1415) Resp:  [10-24] 24 (10/07 1500) BP: (116-152)/(63-97) 133/76 (10/07 1500) SpO2:  [97 %-100 %] 100 % (10/07 1415) Weight:  [68.9 kg] 68.9 kg (10/06 1604)   General: No acute distress. CV: Irregular rhythm, normal rate. No murmurs, rubs, or gallops. No LE edema Pulmonary: Lungs CTAB. Normal effort. No wheezing or rales. Abdominal: Soft, nontender, nondistended. Normal bowel sounds. Extremities: Palpable radial and DP pulses.  Skin: Warm  and dry. No obvious rash or lesions. Neuro: Patient is alert, not oriented.    Lab Results Recent Labs    05/06/21 1610 05/07/21 0543  WBC 6.0 7.6  HGB 12.2* 11.9*  HCT 37.8* 36.6*  NA 138 138  K 4.4 4.0  CL 106 107  CO2 25 24  BUN 30* 23  CREATININE 0.94 0.81   Liver Panel Recent Labs    05/06/21 1610  PROT 6.4*  ALBUMIN 3.9  AST 18  ALT 14  ALKPHOS 64  BILITOT 0.7   Sedimentation Rate No results for input(s): ESRSEDRATE in the last 72 hours. C-Reactive Protein No results for input(s): CRP in the last 72 hours.  Microbiology: urine culture pending  Studies/Results: CT HEAD WO CONTRAST ( )  Result Date: 05/06/2021 CLINICAL DATA:  Mental status change. EXAM: CT HEAD WITHOUT CONTRAST TECHNIQUE: Contiguous axial images were obtained from the base of the skull through the vertex without intravenous contrast. COMPARISON:  CT head 03/09/2021. FINDINGS: Brain: No evidence of acute infarction, hemorrhage, hydrocephalus, extra-axial collection or mass lesion/mass effect. There is stable moderate diffuse atrophy and mild periventricular white matter hypodensity, likely chronic small vessel ischemic change. Vascular: No hyperdense vessel or unexpected calcification. Skull: Normal. Negative for fracture or focal lesion. Sinuses/Orbits: There  is mucosal thickening of the bilateral sphenoid sinuses. Other: Skin staples in the left parietal scalp. IMPRESSION: 1. No acute intracranial process. 2. Stable diffuse atrophy and mild chronic small vessel ischemic change. 3. Bilateral sphenoid sinusitis. Electronically Signed   By: Darliss Cheney M.D.   On: 05/06/2021 23:17     Assessment/Plan: #UTI vs asymptomatic bacteruria  #Altered mental status Urinalysis positive for large leukocytes and >50 WBC. Urine culture still pending, although the patient denies any urinary symptoms at this time. Of note, the patient has had previous admissions for altered mental status that was thought to be  secondary to UTIs at the time, however, previous UA in August only had trace leukocytes and 6-10 WBC and in June had trace leukocytes and no WBC, thus unlikely that the patient truly had a UTI during those encounters. AMS this admission could be secondary to UTI vs progression of his dementia vs medications (spironolactone?) vs other metabolic causes.  - Would continue meropenem until 10/8, then can discontinue abx - Rule out other causes of AMS as per primary   Faylinn Schwenn [facility] for Infectious Diseases Pager: [phone]  05/07/2021, 3:21 PM

## 2021-05-07 NOTE — Progress Notes (Signed)
Family Medicine Teaching Service Daily Progress Note Intern Pager: [phone]  Patient name: Mark Moore Medical record number: [id number] Date of birth: [birth year] Age: 87 y.o. Gender: male  Primary Care Provider: Uvaldo Bristle, NP Consultants: ID Code Status: DNR  Pt Overview and Major Events to Date:  05/06/2021-admitted  Assessment and Plan: Annual L varicella Terrall Laity is an 87 year old male from an extended care facility with past medical history of dementia, A. fib on Eliquis, hypertension, hyperlipidemia, GERD, difficulty hearing admitted for altered mental status.  Altered mental status in the setting of known ESBL Klebsiella infection Patient has history of dementia but according to H&P and report from ED provider daughter states patient has had increased altered mental status for the past week and typically is altered in the setting of UTI.  He was seen at [facility] ED on 05/03/2021 due to a fall with head trauma with a negative head CT. Repeat head CT in ED yesterday was negative for any acute intracranial pathology.  UDS this morning is negative, TSH, vitamin B12 are WNL.  BMP this morning is WNL while CBC shows hemoglobin trending from 12.2> 11.9 with MCV of 102.2.  WBC WNL.  -RPR and HIV tests pending -contact family for better idea of pts baseline mental status  ESBL Klebsiella infection Urine cultures on 05/03/2021 at [facility] ED grew ESBL Klebsiella.  Patient was started on meropenem on 05/06/2021.  Urinalysis and this morning shows large leukocytes greater than 50 WBC and rare bacteria. Due to pts AMS, he is unable to convey is he is or is not having UTI symptoms.  Due to report of increased AMS, will treat with abx and will contact family to get a better idea of pts baseline.  -ID consulted -Meropenem 1 g every 8 hours  Dementia Performs ADLs independently.  Per chart review he has required Haldol and nightly Seroquel in the past due to sundowning -Delirium  precautions -Fall precautions -Seroquel p.o. 25 mg nightly as needed  Hypertension Chronic and stable.  Blood pressure today is 122/70 -Continue home spironolactone 25 mg daily  Hyperlipidemia -Continue atorvastatin 10 mg daily  GERD -Continue Protonix 40 mg daily  BPH  -Continue finasteride 5 mg daily  FEN/GI: Heart healthy diet PPx: Eliquis Dispo:SNF in 3 or more days. Barriers include need for IV abx.   Subjective:  Due to AMS, patient is unable to answer most of my questions.  When asked about pain and how he is feeling he proceeds to talk about his past and tells me how hard he and his wife worked and that they were in normal family and were happy but then his wife died and his mother died.  He continues to tell me information about his children.  When asked orientation questions he states he is tired of [place] asking questions and that we have better memories than him and we should know the answers.   Objective: Temp:  [97 F (36.1 C)] 97 F (36.1 C) (10/06 1701) Pulse Rate:  [28-95] 77 (10/07 0700) Resp:  [10-23] 19 (10/07 0700) BP: (116-152)/(63-97) 122/70 (10/07 0700) SpO2:  [97 %-100 %] 100 % (10/07 0700) Weight:  [68.9 kg] 68.9 kg (10/06 1604) Physical Exam: General: 87 year old male, lying in bed, no acute distress Cardiovascular: Irregular rhythm, regular rate Respiratory: CTAB, normal work of breathing Abdomen: Soft, nontender to palpation, nondistended, normal bowel sounds Extremities: No edema in BLEs  Laboratory: Recent Labs  Lab 05/06/21 1610 05/07/21 0543  WBC 6.0 7.6  HGB 12.2*  11.9*  HCT 37.8* 36.6*  PLT 276 250   Recent Labs  Lab 05/06/21 1610 05/07/21 0543  NA 138 138  K 4.4 4.0  CL 106 107  CO2 25 24  BUN 30* 23  CREATININE 0.94 0.81  CALCIUM 9.8 9.7  PROT 6.4*  --   BILITOT 0.7  --   ALKPHOS 64  --   ALT 14  --   AST 18  --   GLUCOSE 103* 80      Erick Alley, DO 05/07/2021, 9:06 AM PGY-1, [facility] FPTS  Intern pager: [phone], text pages welcome

## 2021-05-07 NOTE — Progress Notes (Signed)
Spoke with daughter on the phone who stated that pt is significantly altered from baseline. She states at baseline, he is able to carry out a normal conversation appropriately and now he cannot.  States he seems delusional as he stated to her recently that he didn't want to eat his food because it was poisoned. States he seems like a different human being. She states he has had similar episodes in the past 5 months and each time was diagnosed with a UTI, treated with abx and mentation improved back to baseline.

## 2021-05-07 NOTE — ED Notes (Signed)
Tele  Breakfast Ordered 

## 2021-05-08 ENCOUNTER — Encounter (HOSPITAL_COMMUNITY): Payer: Self-pay | Admitting: Family Medicine

## 2021-05-08 ENCOUNTER — Inpatient Hospital Stay (HOSPITAL_COMMUNITY): Payer: Medicare Other

## 2021-05-08 DIAGNOSIS — R410 Disorientation, unspecified: Secondary | ICD-10-CM | POA: Diagnosis not present

## 2021-05-08 DIAGNOSIS — I482 Chronic atrial fibrillation, unspecified: Secondary | ICD-10-CM | POA: Diagnosis not present

## 2021-05-08 DIAGNOSIS — H9190 Unspecified hearing loss, unspecified ear: Secondary | ICD-10-CM | POA: Insufficient documentation

## 2021-05-08 DIAGNOSIS — R4189 Other symptoms and signs involving cognitive functions and awareness: Secondary | ICD-10-CM

## 2021-05-08 DIAGNOSIS — R8281 Pyuria: Secondary | ICD-10-CM | POA: Diagnosis not present

## 2021-05-08 LAB — HIV ANTIBODY (ROUTINE TESTING W REFLEX): HIV Screen 4th Generation wRfx: NONREACTIVE

## 2021-05-08 LAB — URINE CULTURE: Culture: <10000 — AB

## 2021-05-08 MED ORDER — VITAMIN B-12 1000 MCG PO TABS
1000.0000 ug | ORAL_TABLET | Freq: Every day | ORAL | Status: DC
  Administered 2021-05-08 – 2021-05-10 (×3): 1000 ug via ORAL
  Filled 2021-05-08 (×3): qty 1

## 2021-05-08 MED ORDER — INFLUENZA VAC A&B SA ADJ QUAD 0.5 ML IM PRSY
0.5000 mL | PREFILLED_SYRINGE | INTRAMUSCULAR | Status: DC
  Filled 2021-05-08: qty 0.5

## 2021-05-08 NOTE — Progress Notes (Addendum)
Family Medicine Teaching Service Daily Progress Note Intern Pager: [phone]  Patient name: Mark Moore Medical record number: [id number] Date of birth: 1929/07/16 Age: 87 y.o. Gender: male  Primary Care Provider: Uvaldo Bristle, NP Consultants: ID Code Status: DNR  Pt Overview and Major Events to Date:  10/7: continued abx with Meropenem   Assessment and Plan: Mark Moore is a 87 y.o. male admitted for AMS with concern for UTI and recent ESBL positive cultures, currently stable and receiving abx therapy. PMH is significant for dementia, A. fib on Eliquis, hypertension, hyperlipidemia, GERD & difficulty hearing.   AMS in setting of ESBL UTI  Hx of Dementia Patient continues to deny presence of pain. Mental status appears stable, patient is oriented to self only, frequently states he does not know why he is here. Has now received meropenem for 48 hours. WBC most recently 7.6. Repeat cultures on admission still pending. Patient remained afebrile overnight,  no tachycardia or tachypnea and patient was stable on RA. Remains on contact precautions. Patient remains demented. - continue VS per floor protocol  - discontinue Meropenem after today's dose - continue vitals signs collection per floor protocol   AF  Patient's telemetry reviewed, showed normal sinus rhythm with occasional missed beats early in the morning hours while patient sleeping. HR WNL ranging from 75-93 overnight. - continue home eliquis 5mg   - hold hm diltiazem  - continue cardiac monitoring   HLD  - continue hom atorvastatin    GERD -continue hm protonix    FEN/GI: heart healthy   PPx: on home eliquis   Dispo:SNF in 2-3 days. Barriers include continued abx therapy while awaiting sensitivities of urine culture.   Subjective:  Ipad interpreter was used for this encounter.  Patient reports he believes he is here after a fall and laceration (applies to ED visit before he was admitted). Patient  denies SOB, abdominal pain or dysuria. He reports feeling cold due to urine collection device falling off during the night.   Objective: Temp:  [98.2 F (36.8 C)-98.5 F (36.9 C)] 98.2 F (36.8 C) (10/08 0400) Pulse Rate:  [75-93] 91 (10/08 0400) Resp:  [10-24] 18 (10/08 0400) BP: (121-153)/(70-92) 141/86 (10/08 0400) SpO2:  [96 %-100 %] 100 % (10/08 0400)  Physical Exam: General: elderly male in NAD, lying supine in bed  Cardiovascular: regular rate, rhythm regular, auscultated two skipped beats during exam, radial pulses palpable  Respiratory: normal WOB, stable on RA  Abdomen: soft, non-tender, bowel sounds present  Extremities: no LE edema   Laboratory: Recent Labs  Lab 05/06/21 1610 05/07/21 0543  WBC 6.0 7.6  HGB 12.2* 11.9*  HCT 37.8* 36.6*  PLT 276 250   Recent Labs  Lab 05/06/21 1610 05/07/21 0543  NA 138 138  K 4.4 4.0  CL 106 107  CO2 25 24  BUN 30* 23  CREATININE 0.94 0.81  CALCIUM 9.8 9.7  PROT 6.4*  --   BILITOT 0.7  --   ALKPHOS 64  --   ALT 14  --   AST 18  --   GLUCOSE 103* 80     Imaging/Diagnostic Tests: No new imaging    07/07/21, MD 05/08/2021, 6:53 AM PGY-3, [place] [facility] FPTS Intern pager: [phone], text pages welcome  I have interviewed and examined the patient.  I have discussed the case with Dr. [phone].   I agree with their documentation and management in their note for today.   Patient moved to [place] from  an ALF in [place] in early May of this year to be close to his daughter and her family.  His wife of sixty years died last fall.   In January 21, 2023, he moved to a local ALF.  He had been noted to have more memory problems, more confusion, and he was having hallucinations.  The ALF testedhis urine and somehow it was decided that he had UTI.  He was treated with Bactrim, though he did not improve. In mid-June he was found half-dressed outside the facility and he had fallen.  He was admitted  to the hospital with diagnosis of ESBL Klebsiella.  He received meropenem IV then fosfomycin oral x 2.  His acute delirium was felt to have improved during his admission.  He was admitted again, this time to the FMTS, on 03/09/21, for wandering about his ALF and being confused.  He had been started on Fosfomycin at his ALF for an ESBL Klebsiella UTI diagnosed on 03/06/21.  He was again treated with IV meropenem during his hospitalization.  Around day two of the hospital stay he was thought to be back to his baseline. The urine culture had no growth.   His daughter thought the patient had a recent worsening of his physical and cognitive functioning.  He has had no fever, no complaint of dysuria, no leukocytosis.  His urine culture has insignificant growth. So I suspect UTI is not present.  Physical Therapy Evaluation 03/11/21 showed overall decreased functional mobility, generalized muscular weakness of UEs & LEs, patient is fearful of falling, poor balance, confused at times, very [facility] - interferes with him hearing Spanish interpreter, decreased activity tolerance.  Recommended SNF; supervision for mobility and OOB. Occupational Therapy evaluation 03/11/21: Needs assistance overall ADLs.  Mr Onalee Hua is not on medications associated with cognitive impairment.  There are no symptoms of a respiratory tract infection. His Vitamin B12 is on the lower end of normal and his MCV is slightly elevated so will start Vit B12 1000 unit daily.   What is Mr Alvarez's drinking history?  If patient does not return to baseline per daughter, then will get Brain MRI to look for a new non-cortical infarction.   Patient with retained stool in ascending colon on CT AP 01/17/21.  Will order KUB to look for evidence of higher impaction.  If poor UOP, check PVR.   Acquanetta Belling, MD Attending FMTS      LOS: 1 day                     For questions or updates, please contact Family Medicine Teaching Service Resident On-call  at pager [phone] or  [email or web address] - see pager "CALL FOR HOSPITALIZED PATIENTS"

## 2021-05-08 NOTE — Plan of Care (Signed)
  Problem: Health Behavior/Discharge Planning: Goal: Ability to manage health-related needs will improve Outcome: Progressing   Problem: Clinical Measurements: Goal: Ability to maintain clinical measurements within normal limits will improve Outcome: Progressing   Problem: Safety: Goal: Ability to remain free from injury will improve Outcome: Progressing   

## 2021-05-08 NOTE — Progress Notes (Signed)
FPTS Brief Progress Note  S:Patient sleeping comfortably at time of visit. No needs at this time.    O: BP 137/90 (BP Location: Right Arm)   Pulse 85   Temp 98.2 F (36.8 C) (Axillary)   Resp 17   Ht 5\' 10"  (1.778 m)   Wt 68.9 kg   SpO2 99%   BMI 21.81 kg/m   Gen: elderly male in NAD, sleeping comfortably facing left side   A/P: Patient is 87 y.o. male admitted for AMS with recent ESBL positive urine culture, stable, currently receiving abx therapy. - continued abx therapy at this time  - Orders reviewed. Labs for AM ordered, which was adjusted as needed.  - If condition changes, plan includes adjustment to abx therapy.   92, MD 05/08/2021, 2:04 AM PGY-3, 07/08/2021 Health Family Medicine Night Resident  Please page [phone] with questions.

## 2021-05-08 NOTE — Progress Notes (Signed)
Family Medicine Teaching Service Daily Progress Note Intern Pager: [phone]  Patient name: Mark Moore Medical record number: [id number] Date of birth: [birth year] Age: 87 y.o. Gender: male  Primary Care Provider: Uvaldo Bristle, NP Consultants: ID (s/o on 10/7) Code Status: DNR  Pt Overview and Major Events to Date:  10/6: Admitted   Assessment and Plan: Mark Moore is an 87 y.o. male with a history of ESBL UTI who was admitted for AMS with concern for recurrent UTI. His urine culture, however, does not appear to suggest urinary infection as a cause of his altered mental status. His PMHx is significant for dementia, atrial fibrillation on Eliquis, HTN, HLD, GERD and very hard of hearing.   Altered Mental Status  Hx of Dementia Urine culture from 10/6 returned with <10,000 colonies/insignificant growth. Blood culture neg x 5 days. Patient does have a known history of dementia, though I'm not sure which kind. Differential for continued AMS includes worsening of baseline dementia (could be step-down from vascular) vs. non-cortical CVA, polypharmacy (unlikely, med list reviewed) vs non-convulsive seizures (also less-likely). Infection less likely at this time given normal WBC and afebrile. Do not feel related to substance-use. Has been started on Vit B12 supplementation after low-normal level and elevated MCV. Other reversible causes of dementia (HIV, syphilis, thyroid disorders) have been assessed and negative. He is oriented to person, place, and time this morning. RN also notes some improvement in mentation since last night.  -Continue 1000 mcg Vitamin B12 -Delirium precautions -Consider Brain MRI  Atrial Fibrillation: chronic, stable  Rate controlled, HR 90-105.  -Continue Eliquis 5 mg twice daily -Holding home diltiazem -Continue cardiac monitoring  History of Hypokalemia Per chart review, appears patient had history of "severe" hypokalemia for which he began taking  spironolactone. -Continue Spironolactone 25 mg daily   History of ESBL UTI -Contact precautions   HLD: Chronic, stable -Continue Atorvastatin  GERD: Chronic, stable -Continue Protonix  Glaucoma -Continue ophthalmic drops   FEN/GI: Heart healthy PPx: Eliquis Dispo:SNF in 2-3 days. Barriers include awaiting urine culture and sensitivities.   Subjective:  Patient feels well this morning. Has no pain or complaints. He states he is having no problem with voiding. He believes he is here because of a recent fall he had, states he slipped.  Objective: Temp:  [97.2 F (36.2 C)-98.5 F (36.9 C)] 98.5 F (36.9 C) (10/08 2000) Pulse Rate:  [82-101] 94 (10/08 2000) Resp:  [17-20] 20 (10/08 2000) BP: (118-149)/(75-90) 118/75 (10/08 2000) SpO2:  [99 %-100 %] 100 % (10/08 2000) Physical Exam: General: Awake, alert, oriented to person, place and time Cardiovascular: Irregularly irregular, rate 90's Respiratory: CTAB without wheezing/rhonchi/rales Abdomen: soft, generalized tenderness in all quadrants without R/G Extremities: No edema  Laboratory: Recent Labs  Lab 05/06/21 1610 05/07/21 0543  WBC 6.0 7.6  HGB 12.2* 11.9*  HCT 37.8* 36.6*  PLT 276 250   Recent Labs  Lab 05/06/21 1610 05/07/21 0543  NA 138 138  K 4.4 4.0  CL 106 107  CO2 25 24  BUN 30* 23  CREATININE 0.94 0.81  CALCIUM 9.8 9.7  PROT 6.4*  --   BILITOT 0.7  --   ALKPHOS 64  --   ALT 14  --   AST 18  --   GLUCOSE 103* 80    Imaging/Diagnostic Tests: DG Abd 1 View  Result Date: 05/08/2021 CLINICAL DATA:  Constipation. EXAM: ABDOMEN - 1 VIEW COMPARISON:  None. FINDINGS: Nonobstructive bowel gas pattern. Nonspecific gaseous distension of  small bowel loops with preserved colonic bowel gas pattern. Average amount of formed stool in the colon. IMPRESSION: Nonspecific sub pathologic gaseous distension of small bowel loops with preserved colonic bowel gas pattern. Electronically Signed   By: Ted Mcalpine M.D.   On: 05/08/2021 18:55     Sabino Dick, DO 05/08/2021, 10:51 PM PGY-2, [place] [facility] FPTS Intern pager: [phone], text pages welcome

## 2021-05-09 DIAGNOSIS — I482 Chronic atrial fibrillation, unspecified: Secondary | ICD-10-CM | POA: Diagnosis not present

## 2021-05-09 DIAGNOSIS — R4189 Other symptoms and signs involving cognitive functions and awareness: Secondary | ICD-10-CM | POA: Diagnosis not present

## 2021-05-09 DIAGNOSIS — R296 Repeated falls: Secondary | ICD-10-CM | POA: Diagnosis not present

## 2021-05-09 DIAGNOSIS — R269 Unspecified abnormalities of gait and mobility: Secondary | ICD-10-CM | POA: Diagnosis not present

## 2021-05-09 LAB — CBC
HCT: 35.6 % — ABNORMAL LOW (ref 39.0–52.0)
Hemoglobin: 12.2 g/dL — ABNORMAL LOW (ref 13.0–17.0)
MCH: 33.6 pg (ref 26.0–34.0)
MCHC: 34.3 g/dL (ref 30.0–36.0)
MCV: 98.1 fL (ref 80.0–100.0)
Platelets: 227 10*3/uL (ref 150–400)
RBC: 3.63 MIL/uL — ABNORMAL LOW (ref 4.22–5.81)
RDW: 13.9 % (ref 11.5–15.5)
WBC: 6.1 10*3/uL (ref 4.0–10.5)
nRBC: 0 % (ref 0.0–0.2)

## 2021-05-09 LAB — BASIC METABOLIC PANEL
Anion gap: 6 (ref 5–15)
BUN: 22 mg/dL (ref 8–23)
CO2: 26 mmol/L (ref 22–32)
Calcium: 9.4 mg/dL (ref 8.9–10.3)
Chloride: 105 mmol/L (ref 98–111)
Creatinine, Ser: 0.86 mg/dL (ref 0.61–1.24)
GFR, Estimated: >60 mL/min (ref >60)
Glucose, Bld: 98 mg/dL (ref 70–99)
Potassium: 3.8 mmol/L (ref 3.5–5.1)
Sodium: 137 mmol/L (ref 135–145)

## 2021-05-09 LAB — MAGNESIUM: Magnesium: 1.8 mg/dL (ref 1.7–2.4)

## 2021-05-09 MED ORDER — MAGNESIUM SULFATE 2 GM/50ML IV SOLN
2.0000 g | Freq: Once | INTRAVENOUS | Status: AC
  Administered 2021-05-09: 2 g via INTRAVENOUS
  Filled 2021-05-09: qty 50

## 2021-05-09 NOTE — Plan of Care (Signed)
  Problem: Clinical Measurements: Goal: Ability to maintain clinical measurements within normal limits will improve Outcome: Progressing   Problem: Pain Managment: Goal: General experience of comfort will improve Outcome: Progressing   Problem: Safety: Goal: Ability to remain free from injury will improve Outcome: Progressing   

## 2021-05-09 NOTE — Evaluation (Addendum)
Physical Therapy Evaluation Patient Details Name: Mark Moore MRN: [id number] DOB: Jul 29, 1929 Today's Date: 05/09/2021  History of Present Illness  Pt is a 87 y.o. M who presents with AMS and concern for recurrent UTI. His urine culture, however, does not appear to suggest urinary infection as a cause of his altered mental status. Significant PMH: dementia, atrial fibrillation, HTN, HOH.  Clinical Impression  Pt admitted with above. He is likely fairly close to his functional baseline. He is alert and oriented to the fact he is in the hospital, the month is October, but initially says the year is 2023. Requiring min guard assist for transfers and ambulating 25 feet with a walker at a min assist level (assist mainly for environmental negotiation and steering walker). HR up to 156 afib during mobility; RN aware, SpO2 100% on RA. Pt denies chest pain or palpitations. Pt presents with generalized weakness, balance deficits, and decreased endurance. Will benefit from follow up PT to address.  Of note, attempted to call daughter to confirm PLOF and level of assist at discharge, however, no response. [employer] interpreter Reuel Boom [phone] assisted with this session.     Recommendations for follow up therapy are one component of a multi-disciplinary discharge planning process, led by the attending physician.  Recommendations may be updated based on patient status, additional functional criteria and insurance authorization.  Follow Up Recommendations Home health PT;Supervision/Assistance - 24 hour (at [facility])    Equipment Recommendations  None recommended by PT    Recommendations for Other Services       Precautions / Restrictions Precautions Precautions: Fall;Other (comment) Precaution Comments: watch HR Restrictions Weight Bearing Restrictions: No      Mobility  Bed Mobility Overal bed mobility: Needs Assistance Bed Mobility: Supine to Sit     Supine to sit: Supervision      General bed mobility comments: Increased time, no physical assist required    Transfers Overall transfer level: Needs assistance Equipment used: Rolling walker (2 wheeled) Transfers: Sit to/from Stand Sit to Stand: Min guard         General transfer comment: min guard for safety  Ambulation/Gait Ambulation/Gait assistance: Min assist Gait Distance (Feet): 25 Feet Assistive device: Rolling walker (2 wheeled) Gait Pattern/deviations: Step-through pattern;Decreased stride length;Trunk flexed Gait velocity: decreased   General Gait Details: Needs cues to keep his hands on the walker as he frequently reaches out for furniture, intermittent assist with turning and steering walker as well as negotiating obstacles  Stairs            Wheelchair Mobility    Modified Rankin (Stroke Patients Only)       Balance Overall balance assessment: Needs assistance Sitting-balance support: Feet supported Sitting balance-Leahy Scale: Fair     Standing balance support: Single extremity supported;During functional activity Standing balance-Leahy Scale: Poor Standing balance comment: reliant on single UE support                             Pertinent Vitals/Pain Pain Assessment: No/denies pain    Home Living Family/patient expects to be discharged to:: Assisted living               Home Equipment: Walker - 2 wheels;Walker - 4 wheels Additional Comments: From [name]    Prior Function Level of Independence: Needs assistance   Gait / Transfers Assistance Needed: limited household ambulator with Rollator  ADL's / Homemaking Assistance Needed: needs assist  Hand Dominance   Dominant Hand: Right    Extremity/Trunk Assessment   Upper Extremity Assessment Upper Extremity Assessment: Defer to OT evaluation    Lower Extremity Assessment Lower Extremity Assessment: Generalized weakness    Cervical / Trunk Assessment Cervical / Trunk  Assessment: Kyphotic  Communication   Communication: Interpreter utilized;Prefers language other than English  Cognition Arousal/Alertness: Awake/alert Behavior During Therapy: WFL for tasks assessed/performed Overall Cognitive Status: Difficult to assess                                 General Comments: Pt very HOH, interpreter utilized, he is oriented to the fact he is in the hospital, the month is October, initially states that the year is 2023      General Comments      Exercises     Assessment/Plan    PT Assessment Patient needs continued PT services  PT Problem List Decreased strength;Decreased activity tolerance;Decreased balance;Decreased mobility;Decreased cognition;Decreased safety awareness;Cardiopulmonary status limiting activity       PT Treatment Interventions DME instruction;Gait training;Functional mobility training;Therapeutic activities;Therapeutic exercise;Balance training;Patient/family education    PT Goals (Current goals can be found in the Care Plan section)  Acute Rehab PT Goals Patient Stated Goal: to urinate PT Goal Formulation: With patient Time For Goal Achievement: 05/23/21 Potential to Achieve Goals: Fair    Frequency Min 3X/week   Barriers to discharge        Co-evaluation               AM-PAC PT "6 Clicks" Mobility  Outcome Measure Help needed turning from your back to your side while in a flat bed without using bedrails?: None Help needed moving from lying on your back to sitting on the side of a flat bed without using bedrails?: A Little Help needed moving to and from a bed to a chair (including a wheelchair)?: A Little Help needed standing up from a chair using your arms (e.g., wheelchair or bedside chair)?: A Little Help needed to walk in hospital room?: A Little Help needed climbing 3-5 steps with a railing? : A Lot 6 Click Score: 18    End of Session Equipment Utilized During Treatment: Gait belt Activity  Tolerance: Patient tolerated treatment well Patient left: in chair;with call bell/phone within reach;with chair alarm set Nurse Communication: Mobility status;Other (comment) (HR) PT Visit Diagnosis: Unsteadiness on feet (R26.81);Muscle weakness (generalized) (M62.81);History of falling (Z91.81);Difficulty in walking, not elsewhere classified (R26.2)    Time: 7062-3762 PT Time Calculation (min) (ACUTE ONLY): 31 min   Charges:   PT Evaluation $PT Eval Moderate Complexity: 1 Mod PT Treatments $Therapeutic Activity: 8-22 mins        Lillia Pauls, PT, DPT Acute Rehabilitation Services Pager [phone] Office [phone]   Norval Morton 05/09/2021, 8:50 AM

## 2021-05-09 NOTE — Progress Notes (Signed)
FPTS Brief Progress Note  S: Patient sleeping comfortably at time of night rounding.    O: BP 113/66 (BP Location: Right Arm)   Pulse 86   Temp 98.4 F (36.9 C) (Axillary)   Resp (!) 22   Ht 5\' 10"  (1.778 m)   Wt 68.9 kg   SpO2 99%   BMI 21.81 kg/m   Gen: asleep in bed, no acute distress  A/P: Altered Mental Status  Dementia Unlikely related to urinary source. Could be worsening dementia vs other neurological cause (non-cortical CVA). KUB unremarkable. Doubt infectious cause contributing at this time.  -Considering MRI brain to further assess; will await to assess in AM when able to talk with patient   Atrial Fibrillation HR 90's. Currently in a-fib.  -Continue Eliquis -Restart Dilt if not rate-controlled - Orders reviewed. Labs for AM ordered, which was adjusted as needed.  - If condition changes, plan includes EKG, restarting dilt if not rate-controlled. Can do further head imaging if any acute neurological changes occur.    , DO 05/09/2021, 12:31 AM PGY-2, [facility] Night Resident  Please page [phone] with questions.

## 2021-05-09 NOTE — Progress Notes (Signed)
Occupational Therapy Evaluation completed.  Pt appears to be close to his baseline, but no family present to confirm.  Recommend HHOT at discharge to ensure full return to baseline.  Eber Jones., OTR/L Acute Rehabilitation Services Pager [phone] Office [phone]

## 2021-05-10 LAB — MAGNESIUM: Magnesium: 2.2 mg/dL (ref 1.7–2.4)

## 2021-05-10 MED ORDER — FAMOTIDINE 20 MG PO TABS
20.0000 mg | ORAL_TABLET | Freq: Two times a day (BID) | ORAL | Status: DC
  Administered 2021-05-10: 20 mg via ORAL
  Filled 2021-05-10: qty 1

## 2021-05-10 MED ORDER — FAMOTIDINE 20 MG PO TABS
20.0000 mg | ORAL_TABLET | Freq: Two times a day (BID) | ORAL | 0 refills | Status: DC
Start: 2021-05-10 — End: 2022-09-16

## 2021-05-10 MED ORDER — INFLUENZA VAC A&B SA ADJ QUAD 0.5 ML IM PRSY: 0.5000 mL | PREFILLED_SYRINGE | Freq: Once | INTRAMUSCULAR | 0 refills | Status: DC

## 2021-05-10 MED ORDER — FAMOTIDINE 40 MG PO TABS: 40.0000 mg | ORAL_TABLET | Freq: Every evening | ORAL | 1 refills | Status: DC

## 2021-05-10 MED ORDER — INFLUENZA VAC A&B SA ADJ QUAD 0.5 ML IM PRSY
0.5000 mL | PREFILLED_SYRINGE | Freq: Once | INTRAMUSCULAR | Status: AC
  Administered 2021-05-10: 0.5 mL via INTRAMUSCULAR
  Filled 2021-05-10: qty 0.5

## 2021-05-10 NOTE — TOC Progression Note (Signed)
Transition of Care [facility]) - Progression Note    Patient Details  Name: Jill Ruppe MRN: [id number] Date of Birth: 04-24-1929  [facility]) CM/SW Contact  Mearl Latin, LCSW Phone Number: 05/10/2021, 3:41 PM  Clinical Narrative:    Per facility, they are unable to accept filled medication because it needs to be in a bubble pack for administration. MD printing a script instead.    Expected Discharge Plan: Assisted Living Barriers to Discharge: No Barriers Identified  Expected Discharge Plan and Services Expected Discharge Plan: Assisted Living In-house Referral: Clinical Social Work   Post Acute Care Choice: Home Health Living arrangements for the past 2 months: Assisted Living Facility Expected Discharge Date: 05/10/21                         HH Arranged: PT, OT HH Agency: [facility] Date [facility] Agency Contacted: 05/10/21 Time HH Agency Contacted: [phone] Representative spoke with at [facility] Agency: Denyse Amass   Social Determinants of Health (SDOH) Interventions    Readmission Risk Interventions No flowsheet data found.

## 2021-05-10 NOTE — Progress Notes (Signed)
FPTS Brief Note Reviewed patient's vitals, recent notes.  Vitals:   05/09/21 2327 05/10/21 0330  BP: 126/88 114/87  Pulse: 83 90  Resp: 14 14  Temp: 97.9 F (36.6 C) 98 F (36.7 C)  SpO2: 97% 95%   At this time, no change in plan from day progress note.  Ronnald Ramp, MD Page [phone] with questions about this patient.

## 2021-05-10 NOTE — Progress Notes (Signed)
Family Medicine Teaching Service Daily Progress Note Intern Pager: [phone]  Patient name: Mark Moore Medical record number: [id number] Date of birth: October 26, 1928 Age: 87 y.o. Gender: male  Primary Care Provider: Uvaldo Bristle, NP Consultants: ID Code Status: DNR  Pt Overview and Major Events to Date:  10/06- Admitted 10/  Assessment and Plan:  AMS in setting of ESBL UTI; History of dementia Afebrile overnight. Oriented to self/location. Not interested in answering questions this morning, seems to have waxing/waning mentation consistent with dementia. Most recent labs show: Magnesium 2.2, repleted yesterday. No tachypnea. PT yesterday, click score 18.Unlikely that UTI is present as urine culture has insignificant growth and he remains afebrile/no complaint of dysuria/no leukocytosis, CT head negative/ KUB with no evidence of obstipation, consider dementia vs other neruological cause (non cortical CVA). -Continue VS per floor protocol -Considering brain MRI to further assess  Rate-controlled atrial fibrillation Telemetry reviewed. In atrial fibrillation. HR ranges overnight: 80s-90s. HR up to 156 afib during mobility with PT yesterday morning without hypoxia or chest pain and will need ongoing PT assiance. -Continue home eliquis 5mg  -Hold home diltiazem (2/2 to NSR with skipped beats on 10/7  History of hypokalemia Per chart review, has history of "severe" hypokalemia, and this is reasoning for spironolcatone -Continue spironolactone 25 mg daily  HLD Chronic, stable. -Continue atorvostatin  GERD Chronic, stable -Continue Protonix  Glaucoma -Continue ophthalmic drops  FEN/GI: Heart healthy PPx: Eliquis Dispo:SNF in 2-3 days. Barriers include none.   Subjective:  Utilized 12/7.  Patient shrugged his shoulders during our conversation and did not answer my questions directly well. When asked if he knows where he is he says "hospital" but can not  tell me much else. When asked about pain anywhere, he stares at me and shrugs his shoulders.  Objective: Temp:  [97.7 F (36.5 C)-98.5 F (36.9 C)] 98 F (36.7 C) (10/10 0330) Pulse Rate:  [83-100] 90 (10/10 0330) Resp:  [11-18] 14 (10/10 0330) BP: (101-126)/(69-88) 114/87 (10/10 0330) SpO2:  [94 %-100 %] 95 % (10/10 0330) Weight:  [69.6 kg] 69.6 kg (10/10 0330) Physical Exam: General: Awake, alert to self, place Cardiovascular: Irregularly irregular, rate 90s Respiratory: No increased work of breathing. Lungs clear in all fields to auscultation. Abdomen: Soft, non-tender, non-distended Extremities: No edema. Warm, well-perfused  Laboratory: Recent Labs  Lab 05/06/21 1610 05/07/21 0543 05/09/21 0124  WBC 6.0 7.6 6.1  HGB 12.2* 11.9* 12.2*  HCT 37.8* 36.6* 35.6*  PLT 276 250 227   Recent Labs  Lab 05/06/21 1610 05/07/21 0543 05/09/21 0124  NA 138 138 137  K 4.4 4.0 3.8  CL 106 107 105  CO2 25 24 26   BUN 30* 23 22  CREATININE 0.94 0.81 0.86  CALCIUM 9.8 9.7 9.4  PROT 6.4*  --   --   BILITOT 0.7  --   --   ALKPHOS 64  --   --   ALT 14  --   --   AST 18  --   --   GLUCOSE 103* 80 98    Imaging/Diagnostic Tests: No results found.   07/09/21, DO 05/10/2021, 7:04 AM PGY-1, [place] [facility] FPTS Intern pager: [phone], text pages welcome

## 2021-05-10 NOTE — Discharge Summary (Addendum)
Family Medicine Teaching [facility] Discharge Summary  Patient name: Mark Moore Medical record number: [id number] Date of birth: 1928/11/01 Age: 87 y.o. Gender: male Date of Admission: 05/06/2021  Date of Discharge: 05/10/2021 Admitting Physician: Carney Living, MD  Primary Care Provider: Uvaldo Bristle, NP Consultants: None  Indication for Hospitalization: Altered mental status  Discharge Diagnoses/Problem List:  Active Problems:   Atrial fibrillation, chronic (HCC)   Cognitive impairment   Altered mental status   AMS (altered mental status)   Pyuria   Abnormal gait   Falls frequently   Disposition: ALF  Discharge Condition: Stable  Discharge Exam:   General: Awake, alert to self, place Cardiovascular: Irregularly irregular, rate 90s Respiratory: No increased work of breathing. Lungs clear in all fields to auscultation. Abdomen: Soft, non-tender, non-distended Extremities: No edema. Warm, well-perfused    Brief Hospital Course:  Mark Moore is a 87 y.o. male who presented from extended care facility with worsening mental status and concern for UTI. PMH is significant for dementia, atrial fibrillation on Pradaxa, hypertension, hyperlipidemia, GERD, hearing difficulty.  AMS in setting of known Dementia  Patient presented from ALF due to worsening confusion. Patient noted to have dementia at baseline, however, appeared to be worsening after fall three days prior to presenting to the ED a time of admission. Patient was evaluated after a fall 10/3 (negative head CT at that time, repeat head CT is negative). Prior urine cultures were positive for ESBL Klebsiella Pneumoniae in June 2022 with sensitivities to gentamicin and imipenem. Newer outside hospital urine culture noted sensitivities to Zosyn however given resistance from June cultures, he was started on meropenem on 10/6. Vitals at the time of admission were WNL and labs were unremarkable. Patient  was unable to recall why he was in the ED nor was he able to answer orientation questions. TSH, and B12 were WBL with a negative UDS. HIV and RPR were negative. U/A on admission notable for large leukocytes and microscopy shows rare bacteria. Urine culture with insignificant growth and abx were discontinued. Mentation improved throughout hospitalization back to baseline. Was evaluated by OT/PT who recommended return to ALF.  Atrial Fibrillation  Intermittent atrial fibrillation throughout hospitalization. Was continued on Eliquis though diltiazem held. Diltiazem was continued at discharge.   Staple removal 3 staples that were on scalp for 7 days after mechanical fall on 10/3 were removed on day of discharge (10/10). They were present for recommended 7 days. Granulation tissue present and sterile technique was used to remove staples. No bleeding.  Other chronic problems stable.  Significant Procedures: None  Significant Labs and Imaging:  Recent Labs  Lab 05/06/21 1610 05/07/21 0543 05/09/21 0124  WBC 6.0 7.6 6.1  HGB 12.2* 11.9* 12.2*  HCT 37.8* 36.6* 35.6*  PLT 276 250 227   Recent Labs  Lab 05/06/21 1610 05/07/21 0543 05/09/21 0124 05/10/21 0044  NA 138 138 137  --   K 4.4 4.0 3.8  --   CL 106 107 105  --   CO2 25 24 26   --   GLUCOSE 103* 80 98  --   BUN 30* 23 22  --   CREATININE 0.94 0.81 0.86  --   CALCIUM 9.8 9.7 9.4  --   MG  --   --  1.8 2.2  ALKPHOS 64  --   --   --   AST 18  --   --   --   ALT 14  --   --   --  ALBUMIN 3.9  --   --   --     Results/Tests Pending at Time of Discharge: None  Discharge Medications:  Allergies as of 05/10/2021       Reactions   Wellbutrin [bupropion] Other (See Comments)   XL 150 mg- Made the patient feel paranoid and have borderline hallucinations        Medication List     STOP taking these medications    pantoprazole 40 MG tablet Commonly known as: PROTONIX       TAKE these medications    acetaminophen  500 MG tablet Commonly known as: TYLENOL Take 1,000 mg by mouth in the morning and at bedtime.   atorvastatin 10 MG tablet Commonly known as: LIPITOR Take 1 tablet (10 mg total) by mouth daily.   brimonidine 0.2 % ophthalmic solution Commonly known as: ALPHAGAN Place 1 drop into both eyes 3 (three) times daily. What changed: when to take this   diltiazem 30 MG tablet Commonly known as: CARDIZEM Take 1 tablet (30 mg total) by mouth in the morning and at bedtime.   dorzolamide 2 % ophthalmic solution Commonly known as: TRUSOPT Place 1 drop into both eyes 2 (two) times daily.   Eliquis 5 MG Tabs tablet Generic drug: apixaban Take 5 mg by mouth 2 (two) times daily.   famotidine 20 MG tablet Commonly known as: PEPCID Take 1 tablet (20 mg total) by mouth 2 (two) times daily.   finasteride 5 MG tablet Commonly known as: PROSCAR Take 1 tablet (5 mg total) by mouth daily.   spironolactone 50 MG tablet Commonly known as: ALDACTONE Take 50 mg by mouth daily.        Discharge Instructions: Please refer to Patient Instructions section of EMR for full details.  Patient was counseled important signs and symptoms that should prompt return to medical care, changes in medications, dietary instructions, activity restrictions, and follow up appointments.   Follow-Up Appointments:  Follow-up Information     Uvaldo Bristle, NP. Schedule an appointment as soon as possible for a visit in 1 week(s).   Specialty: Gerontology Contact information: [address] [place] [address] [phone]         Quintella Reichert, MD. Schedule an appointment as soon as possible for a visit in 1 week(s).   Specialty: Cardiology Contact information: [address] [place] [address] [phone]                 Darral Dash, DO 05/10/2021, 2:55 PM PGY-1, [facility]

## 2021-05-10 NOTE — Progress Notes (Signed)
Pt prepared for d/c to ALF. IV d/c'd. Skin intact except as charted in most recent assessments. Vitals are stable. Attempted report to receiving facility, message left for nurse to call back. Daughter given DC papers, no questions/concerns. Pt to be transported by daughter in private vehicle.

## 2021-05-10 NOTE — TOC Transition Note (Signed)
Transition of Care [facility]) - CM/SW Discharge Note   Patient Details  Name: Mark Moore MRN: [id number] Date of Birth: 27-Apr-1929  Transition of Care [facility]) CM/SW Contact:  Mearl Latin, LCSW Phone Number: 05/10/2021, 4:06 PM   Clinical Narrative:    Patient will DC to: [name] Anticipated DC date: 05/10/21 Family notified: Daughter Transport by: Daughter by car   Per MD patient ready for DC to ALF. RN to call report prior to discharge [phone]). RN, patient, patient's family, and facility notified of DC. Discharge Summary and FL2 sent to facility with script.   CSW will sign off for now as social work intervention is no longer needed. Please consult [place] again if new needs arise.     Final next level of care: Assisted Living Barriers to Discharge: No Barriers Identified   Patient Goals and CMS Choice Patient states their goals for this hospitalization and ongoing recovery are:: Return to ALF w/HH CMS Medicare.gov Compare Post Acute Care list provided to:: Patient Represenative (must comment) Choice offered to / list presented to : Adult Children  Discharge Placement                Patient to be transferred to facility by: car Name of family member notified: Daughter Patient and family notified of of transfer: 05/10/21  Discharge Plan and Services In-house Referral: Clinical Social Work   Post Acute Care Choice: Home Health                    HH Arranged: PT, OT [facility] Agency: [facility] Date [facility] Agency Contacted: 05/10/21 Time HH Agency Contacted: [phone] Representative spoke with at [facility] Agency: Denyse Amass  Social Determinants of Health (SDOH) Interventions     Readmission Risk Interventions No flowsheet data found.

## 2021-05-10 NOTE — TOC Initial Note (Signed)
Transition of Care [facility]) - Initial/Assessment Note    Patient Details  Name: Mark Moore MRN: [id number] Date of Birth: [birth year]  Transition of Care [facility]) CM/SW Contact:    Mearl Latin, LCSW Phone Number: 05/10/2021, 3:00 PM  Clinical Narrative:                 Patient ready to discharge to [facility]. CSW faxed FL2 and DC Summary to Kisha (f. [phone]) for review. MD sent new med to [facility] pharmacy to be filled. Patient's daughter to provide transport. Per ALF, they use [facility] for home health. CSW notified [facility] and they can accept for PT/OT.   Expected Discharge Plan: Assisted Living Barriers to Discharge: No Barriers Identified   Patient Goals and CMS Choice Patient states their goals for this hospitalization and ongoing recovery are:: Return to ALF w/HH CMS Medicare.gov Compare Post Acute Care list provided to:: Patient Represenative (must comment) Choice offered to / list presented to : Adult Children  Expected Discharge Plan and Services Expected Discharge Plan: Assisted Living In-house Referral: Clinical Social Work   Post Acute Care Choice: Home Health Living arrangements for the past 2 months: Assisted Living Facility Expected Discharge Date: 05/10/21                         HH Arranged: PT, OT HH Agency: [facility] Date [facility] Agency Contacted: 05/10/21 Time HH Agency Contacted: [phone] Representative spoke with at [facility] Agency: Denyse Amass  Prior Living Arrangements/Services Living arrangements for the past 2 months: Assisted Living Facility Lives with:: Facility Resident Patient language and need for interpreter reviewed:: Yes Do you feel safe going back to the place where you live?: Yes      Need for Family Participation in Patient Care: Yes (Comment) Care giver support system in place?: Yes (comment)   Criminal Activity/Legal Involvement Pertinent to Current Situation/Hospitalization: No - Comment as needed  Activities of Daily  Living      Permission Sought/Granted Permission sought to share information with : Facility Medical sales representative, Family Supports Permission granted to share information with : Yes, Verbal Permission Granted  Share Information with NAME: [name]  Permission granted to share info w AGENCY: [name]  Permission granted to share info w Relationship: Daughter  Permission granted to share info w Contact Information: [phone]  Emotional Assessment Appearance:: Appears stated age Attitude/Demeanor/Rapport: Unable to Assess Affect (typically observed): Unable to Assess Orientation: : Oriented to Self, Oriented to  Time Alcohol / Substance Use: Not Applicable Psych Involvement: No (comment)  Admission diagnosis:  Altered mental status [R41.82] Urinary tract infection without hematuria, site unspecified [N39.0] AMS (altered mental status) [R41.82] Patient Active Problem List   Diagnosis Date Noted   Abnormal gait    Falls frequently    Pyuria 05/08/2021   Hard of hearing 05/08/2021   Urinary tract infection without hematuria    AMS (altered mental status) 05/06/2021   ESBL (extended spectrum beta-lactamase) producing bacteria infection 03/09/2021   Altered mental status    Acute encephalopathy 01/18/2021   Acute metabolic encephalopathy 01/17/2021   Atrial fibrillation, chronic (HCC) 01/17/2021   Fall (on)(from) sidewalk curb, initial encounter 01/17/2021   Cognitive impairment 01/17/2021   Depression 01/17/2021   Dyslipidemia 01/17/2021   Glaucoma 01/17/2021   DNR (do not resuscitate) 01/17/2021   Hyperlipidemia 12/03/2020   Hypertension 12/02/2020   Depression 12/02/2020   GERD (gastroesophageal reflux disease) 12/02/2020   BPH (benign prostatic hyperplasia) 12/02/2020  PCP:  Uvaldo Bristle, NP Pharmacy:   [facility] [address] [place] [address] [place] [address] Phone: [phone] Fax: [phone]  [facility] [address] [place] [address] Phone: [phone] Fax: [phone]     Social Determinants of Health (SDOH) Interventions    Readmission Risk Interventions No flowsheet data found.

## 2021-05-10 NOTE — Discharge Instructions (Addendum)
Dear Mark Moore,   Thank you for letting us participate in your care! In this section, you will find a brief summary of why you were admitted to the hospital, what happened during your admission, your diagnosis/diagnoses, and recommended follow up.  You were admitted because you were experiencing increased confusion and studies from an outside hospital were suggestive of UTI. You were treated for a few days with a very strong antibiotic. Your urine culture then came back negative- which means you do not have a UTI. Some of your confusion may be related to the normal fluctuation that occurs in patient's with dementia You were also seen by the infectious disease doctors while you were here Your confusion improved and you were discharged from the hospital for meeting this goal.    POST-HOSPITAL & CARE INSTRUCTIONS Please see medications section of this packet for any medication changes.   DOCTOR'S APPOINTMENTS & FOLLOW UP No future appointments.   Thank you for choosing [facility]! Take care and be well!  Family Medicine Teaching Service Inpatient Team [place]  [facility]  [address] [place], [place] [address] [phone]

## 2021-05-10 NOTE — NC FL2 (Signed)
[place] MEDICAID FL2 LEVEL OF CARE SCREENING TOOL     IDENTIFICATION  Patient Name: Mark Moore Birthdate: 11-11-1928 Sex: male Admission Date (Current Location): 05/06/2021  [facility] and [place] Number:  Producer, television/film/video and Address:  [facility], [address] [place], [place] [address]      Provider Number: [id number]  Attending Physician Name and Address:  Carney Living, MD  Relative Name and Phone Number:       Current Level of Care: Hospital Recommended Level of Care: Assisted Living Facility Prior Approval Number:    Date Approved/Denied:   PASRR Number:    Discharge Plan: Other (Comment) (ALF)    Current Diagnoses: Patient Active Problem List   Diagnosis Date Noted   Abnormal gait    Falls frequently    Pyuria 05/08/2021   Hard of hearing 05/08/2021   Urinary tract infection without hematuria    AMS (altered mental status) 05/06/2021   ESBL (extended spectrum beta-lactamase) producing bacteria infection 03/09/2021   Altered mental status    Acute encephalopathy 01/18/2021   Acute metabolic encephalopathy 01/17/2021   Atrial fibrillation, chronic (HCC) 01/17/2021   Fall (on)(from) sidewalk curb, initial encounter 01/17/2021   Cognitive impairment 01/17/2021   Depression 01/17/2021   Dyslipidemia 01/17/2021   Glaucoma 01/17/2021   DNR (do not resuscitate) 01/17/2021   Hyperlipidemia 12/03/2020   Hypertension 12/02/2020   Depression 12/02/2020   GERD (gastroesophageal reflux disease) 12/02/2020   BPH (benign prostatic hyperplasia) 12/02/2020    Orientation RESPIRATION BLADDER Height & Weight     Self, Time, Place  Normal Incontinent Weight: 153 lb 7 oz (69.6 kg) Height:  5\' 10"  (177.8 cm)  BEHAVIORAL SYMPTOMS/MOOD NEUROLOGICAL BOWEL NUTRITION STATUS      Continent Diet (Regular)  AMBULATORY STATUS COMMUNICATION OF NEEDS Skin   Limited Assist Verbally Normal                       Personal  Care Assistance Level of Assistance  Bathing, Feeding, Dressing Bathing Assistance: Limited assistance Feeding assistance: Independent Dressing Assistance: Limited assistance     Functional Limitations Info             SPECIAL CARE FACTORS FREQUENCY  PT (By licensed PT), OT (By licensed OT)     PT Frequency: Home Health 3x/week OT Frequency: Home Health 3x/week            Contractures Contractures Info: Not present    Additional Factors Info  Code Status, Allergies Code Status Info: DNR Allergies Info: Wellbutrin           Current Medications (05/10/2021):   Discharge Medications: STOP taking these medications     pantoprazole 40 MG tablet Commonly known as: PROTONIX           TAKE these medications     acetaminophen 500 MG tablet Commonly known as: TYLENOL Take 1,000 mg by mouth in the morning and at bedtime.    atorvastatin 10 MG tablet Commonly known as: LIPITOR Take 1 tablet (10 mg total) by mouth daily.    brimonidine 0.2 % ophthalmic solution Commonly known as: ALPHAGAN Place 1 drop into both eyes 3 (three) times daily. What changed: when to take this    diltiazem 30 MG tablet Commonly known as: CARDIZEM Take 1 tablet (30 mg total) by mouth in the morning and at bedtime.    dorzolamide 2 % ophthalmic solution Commonly known as: TRUSOPT Place 1 drop  into both eyes 2 (two) times daily.    Eliquis 5 MG Tabs tablet Generic drug: apixaban Take 5 mg by mouth 2 (two) times daily.    famotidine 20 MG tablet Commonly known as: PEPCID Take 1 tablet (20 mg total) by mouth 2 (two) times daily.    finasteride 5 MG tablet Commonly known as: PROSCAR Take 1 tablet (5 mg total) by mouth daily.    influenza vaccine adjuvanted 0.5 ML injection Commonly known as: FLUAD Inject 0.5 mLs into the muscle once for 1 dose.    spironolactone 50 MG tablet Commonly known as: ALDACTONE Take 50 mg by mouth daily.    Relevant Imaging Results:  Relevant  Lab Results:   Additional Information SSN [id number]. Moderna COVID-19 Vaccine 06/06/2020 , 09/27/2019 , 08/29/2019  Mearl Latin, LCSW

## 2021-05-10 NOTE — Progress Notes (Signed)
Occupational Therapy Evaluation (late entry)  Pt presents to OT with the below listed diagnosis and the below listed deficits.  He requires min guard assist for ADLs and functional mobility.  HR to 150 with activity.  PTA, he lived at [facility].  Recommend return to ALF with HHOT.    05/09/21 1800  OT Visit Information  Last OT Received On 05/09/21  Assistance Needed +1  History of Present Illness Pt is a 87 y.o. M who presents with AMS and concern for recurrent UTI. His urine culture, however, does not appear to suggest urinary infection as a cause of his altered mental status. Significant PMH: dementia, atrial fibrillation, HTN, HOH.  Precautions  Precautions Fall;Other (comment)  Precaution Comments watch HR  Home Living  Family/patient expects to be discharged to: Assisted living  Home Equipment Walker - 2 wheels;Walker - 4 wheels  Additional Comments From [name]  Prior Function  Level of Independence Needs assistance  Gait / Transfers Assistance Needed limited household ambulator with Rollator  ADL's / Homemaking Assistance Needed needs assist  Comments no family present to provide info. PT attempted to contact family earlier in the day with no success  Communication  Communication Interpreter utilized;Prefers language other than English (aturo #)  Pain Assessment  Pain Assessment No/denies pain  Cognition  Arousal/Alertness Awake/alert  Behavior During Therapy WFL for tasks assessed/performed  Overall Cognitive Status No family/caregiver present to determine baseline cognitive functioning  General Comments Pt HOH  Difficult to assess due to Non-English speaking;Hard of hearing/deaf  Upper Extremity Assessment  Upper Extremity Assessment Overall WFL for tasks assessed  Lower Extremity Assessment  Lower Extremity Assessment Defer to PT evaluation  ADL  Overall ADL's  Needs assistance/impaired  Eating/Feeding Independent  Grooming Wash/dry hands;Wash/dry face;Oral  care;Brushing hair;Min guard;Standing  Upper Body Bathing Set up;Sitting  Lower Body Bathing Min guard;Sit to/from stand  Upper Body Dressing  Set up;Sitting  Lower Body Dressing Min guard;Sit to/from Freight forwarder guard;Ambulation;Comfort height toilet;RW  Engineer, building services Min guard;Sit to/from stand  Functional mobility during ADLs Min guard;Rolling walker  General ADL Comments min guard assist for safety  Bed Mobility  General bed mobility comments in chair  Transfers  Overall transfer level Needs assistance  Equipment used Rolling walker (2 wheeled)  Transfers Sit to/from [employer] Transfers  Sit to Stand Min guard  Stand pivot transfers Min guard  General transfer comment min guard for safety  Balance  Sitting balance-Leahy Scale Good  Standing balance support No upper extremity supported  Standing balance-Leahy Scale Fair  General Comments  General comments (skin integrity, edema, etc.) HR to 150 with activity  OT - End of Session  Equipment Utilized During Treatment Rolling walker  Activity Tolerance Patient tolerated treatment well  Patient left in chair;with chair alarm set  Nurse Communication Mobility status  OT Assessment  OT Recommendation/Assessment Patient needs continued OT Services  OT Visit Diagnosis Unsteadiness on feet (R26.81);Cognitive communication deficit (R41.841)  OT Problem List Decreased strength;Decreased activity tolerance;Impaired balance (sitting and/or standing);Decreased cognition;Decreased safety awareness;Decreased knowledge of use of DME or AE  OT Plan  OT Frequency (ACUTE ONLY) Min 2X/week  OT Treatment/Interventions (ACUTE ONLY) Self-care/ADL training;DME and/or AE instruction;Therapeutic activities;Cognitive remediation/compensation;Patient/family education;Balance training  AM-PAC OT "6 Clicks" Daily Activity Outcome Measure (Version 2)  Help from another person eating meals? 4  Help from  another person taking care of personal grooming? 3  Help from another person toileting, which includes using toliet,  bedpan, or urinal? 3  Help from another person bathing (including washing, rinsing, drying)? 3  Help from another person to put on and taking off regular upper body clothing? 3  Help from another person to put on and taking off regular lower body clothing? 3  6 Click Score 19  Progressive Mobility  What is the highest level of mobility based on the progressive mobility assessment? Level 4 (Walks with assist in room) - Balance while marching in place and cannot step forward and back - Complete  Mobility Ambulated with assistance in hallway;Ambulated with assistance in room;Out of bed to chair with meals;Out of bed for toileting  OT Recommendation  Follow Up Recommendations Home health OT;Supervision/Assistance - 24 hour  OT Equipment None recommended by OT  Individuals Consulted  Consulted and Agree with Results and Recommendations Patient unable/family or caregiver not available  Acute Rehab OT Goals  Patient Stated Goal to have better food  OT Time Calculation  OT Start Time (ACUTE ONLY) 1816  OT Stop Time (ACUTE ONLY) 1837  OT Time Calculation (min) 21 min  OT General Charges  $OT Visit 1 Visit  OT Evaluation  $OT Eval Moderate Complexity 1 Mod  Written Expression  Dominant Hand Right  Eber Jones., OTR/L Acute Rehabilitation Services Pager [phone] Office [phone]

## 2021-05-13 DIAGNOSIS — R339 Retention of urine, unspecified: Secondary | ICD-10-CM | POA: Insufficient documentation

## 2021-05-13 DIAGNOSIS — F0390 Unspecified dementia without behavioral disturbance: Secondary | ICD-10-CM | POA: Insufficient documentation

## 2021-05-13 DIAGNOSIS — D539 Nutritional anemia, unspecified: Secondary | ICD-10-CM | POA: Insufficient documentation

## 2021-08-09 ENCOUNTER — Emergency Department (HOSPITAL_COMMUNITY): Payer: Medicare Other

## 2021-08-09 ENCOUNTER — Emergency Department (HOSPITAL_COMMUNITY)
Admission: EM | Admit: 2021-08-09 | Discharge: 2021-08-10 | Disposition: A | Discharge status: Home / Self Care | Payer: Medicare Other | Attending: Emergency Medicine | Admitting: Emergency Medicine

## 2021-08-09 DIAGNOSIS — F0390 Unspecified dementia without behavioral disturbance: Secondary | ICD-10-CM | POA: Insufficient documentation

## 2021-08-09 DIAGNOSIS — R000 Tachycardia, unspecified: Secondary | ICD-10-CM | POA: Diagnosis not present

## 2021-08-09 DIAGNOSIS — R531 Weakness: Secondary | ICD-10-CM | POA: Diagnosis not present

## 2021-08-09 DIAGNOSIS — Z20822 Contact with and (suspected) exposure to covid-19: Secondary | ICD-10-CM | POA: Diagnosis not present

## 2021-08-09 DIAGNOSIS — R4182 Altered mental status, unspecified: Secondary | ICD-10-CM | POA: Insufficient documentation

## 2021-08-09 DIAGNOSIS — Z7901 Long term (current) use of anticoagulants: Secondary | ICD-10-CM | POA: Diagnosis not present

## 2021-08-09 DIAGNOSIS — R5383 Other fatigue: Secondary | ICD-10-CM

## 2021-08-09 LAB — CBC WITH DIFFERENTIAL/PLATELET
Abs Immature Granulocytes: 0.06 10*3/uL (ref 0.00–0.07)
Basophils Absolute: 0.1 10*3/uL (ref 0.0–0.1)
Basophils Relative: 1 %
Eosinophils Absolute: 0 10*3/uL (ref 0.0–0.5)
Eosinophils Relative: 1 %
HCT: 44.7 % (ref 39.0–52.0)
Hemoglobin: 14.4 g/dL (ref 13.0–17.0)
Immature Granulocytes: 1 %
Lymphocytes Relative: 6 %
Lymphs Abs: 0.5 10*3/uL — ABNORMAL LOW (ref 0.7–4.0)
MCH: 32.1 pg (ref 26.0–34.0)
MCHC: 32.2 g/dL (ref 30.0–36.0)
MCV: 99.8 fL (ref 80.0–100.0)
Monocytes Absolute: 0.4 10*3/uL (ref 0.1–1.0)
Monocytes Relative: 4 %
Neutro Abs: 7.8 10*3/uL — ABNORMAL HIGH (ref 1.7–7.7)
Neutrophils Relative %: 87 %
Platelets: 229 10*3/uL (ref 150–400)
RBC: 4.48 MIL/uL (ref 4.22–5.81)
RDW: 13.6 % (ref 11.5–15.5)
WBC: 8.8 10*3/uL (ref 4.0–10.5)
nRBC: 0 % (ref 0.0–0.2)

## 2021-08-09 LAB — URINALYSIS, MICROSCOPIC (REFLEX)

## 2021-08-09 LAB — BASIC METABOLIC PANEL
Anion gap: 10 (ref 5–15)
BUN: 26 mg/dL — ABNORMAL HIGH (ref 8–23)
CO2: 24 mmol/L (ref 22–32)
Calcium: 9.3 mg/dL (ref 8.9–10.3)
Chloride: 109 mmol/L (ref 98–111)
Creatinine, Ser: 1.09 mg/dL (ref 0.61–1.24)
GFR, Estimated: >60 mL/min (ref >60)
Glucose, Bld: 108 mg/dL — ABNORMAL HIGH (ref 70–99)
Potassium: 3.6 mmol/L (ref 3.5–5.1)
Sodium: 143 mmol/L (ref 135–145)

## 2021-08-09 LAB — URINALYSIS, ROUTINE W REFLEX MICROSCOPIC
Bilirubin Urine: NEGATIVE
Glucose, UA: NEGATIVE mg/dL
Ketones, ur: NEGATIVE mg/dL
Leukocytes,Ua: NEGATIVE
Nitrite: NEGATIVE
Protein, ur: NEGATIVE mg/dL
Specific Gravity, Urine: >1.03 — ABNORMAL HIGH (ref 1.005–1.030)
pH: 6 (ref 5.0–8.0)

## 2021-08-09 LAB — CBG MONITORING, ED: Glucose-Capillary: 86 mg/dL (ref 70–99)

## 2021-08-09 NOTE — ED Provider Notes (Signed)
[facility] EMERGENCY DEPARTMENT Provider Note   CSN: [id number] Arrival date & time: 08/09/21  [id number]     History  Chief Complaint  Patient presents with   Altered Mental Status    Mark Moore is a 88 y.o. male.  Patient with hx dementia, afib, presents via ems from SNF. Patient noted to appear generally weak today, with decreased alertness. No report of trauma/fall. No report of fevers. EMS was called, bp normal. CBG 126. No noted acute change in meds or new meds. Is on anticoag therapy at baseline for afib. Pt limited historian, dementia, level 5 caveat.   The history is provided by the patient, medical records, the EMS personnel and a caregiver. The history is limited by the condition of the patient. A language interpreter was used.  Altered Mental Status Associated symptoms: no fever and no vomiting       Home Medications Prior to Admission medications   Medication Sig Start Date End Date Taking? Authorizing Provider  acetaminophen (TYLENOL) 500 MG tablet Take 1,000 mg by mouth in the morning and at bedtime.    [provider]  atorvastatin (LIPITOR) 10 MG tablet Take 1 tablet (10 mg total) by mouth daily. 12/02/20   Colon Branch, MD  brimonidine (ALPHAGAN) 0.2 % ophthalmic solution Place 1 drop into both eyes 3 (three) times daily. Patient taking differently: Place 1 drop into both eyes 2 (two) times daily. 12/03/20   Colon Branch, MD  diltiazem (CARDIZEM) 30 MG tablet Take 1 tablet (30 mg total) by mouth in the morning and at bedtime. 12/02/20   Colon Branch, MD  dorzolamide (TRUSOPT) 2 % ophthalmic solution Place 1 drop into both eyes 2 (two) times daily. 12/03/20   Colon Branch, MD  ELIQUIS 5 MG TABS tablet Take 5 mg by mouth 2 (two) times daily. 03/15/21   [provider]  famotidine (PEPCID) 20 MG tablet Take 1 tablet (20 mg total) by mouth 2 (two) times daily. 05/10/21   Dameron, Luna Fuse, DO  famotidine (PEPCID) 40 MG tablet Take 1 tablet  (40 mg total) by mouth every evening. 05/10/21 05/10/22  Dameron, Luna Fuse, DO  finasteride (PROSCAR) 5 MG tablet Take 1 tablet (5 mg total) by mouth daily. 12/02/20   Colon Branch, MD  spironolactone (ALDACTONE) 50 MG tablet Take 50 mg by mouth daily.    [provider]      Allergies    Wellbutrin [bupropion]    Review of Systems   Review of Systems  Unable to perform ROS: Dementia  Constitutional:  Negative for fever.  Respiratory:  Negative for shortness of breath.   Gastrointestinal:  Negative for vomiting.  Skin:  Negative for wound.  Level 5 caveat, dementia.    Physical Exam Updated Vital Signs BP (!) 139/98    Pulse 91    Temp 97.6 F (36.4 C) (Oral)    Resp (!) 23    SpO2 98%  Physical Exam Vitals and nursing note reviewed.  Constitutional:      Appearance: Normal appearance. He is well-developed.  HENT:     Head: Atraumatic.     Nose: Nose normal.     Mouth/Throat:     Mouth: Mucous membranes are moist.     Pharynx: Oropharynx is clear. No oropharyngeal exudate or posterior oropharyngeal erythema.  Eyes:     General: No scleral icterus.    Conjunctiva/sclera: Conjunctivae normal.     Pupils: Pupils are equal, round,  and reactive to light.  Neck:     Vascular: No carotid bruit.     Trachea: No tracheal deviation.     Comments: No stiffness or rigidity Cardiovascular:     Rate and Rhythm: Tachycardia present. Rhythm irregular.     Pulses: Normal pulses.     Heart sounds: Normal heart sounds. No murmur heard.   No friction rub. No gallop.  Pulmonary:     Effort: Pulmonary effort is normal. No accessory muscle usage or respiratory distress.     Breath sounds: Normal breath sounds.  Abdominal:     General: Bowel sounds are normal. There is no distension.     Palpations: Abdomen is soft.     Tenderness: There is no abdominal tenderness. There is no guarding.  Genitourinary:    Comments: No cva tenderness. Musculoskeletal:        General: No swelling or  tenderness.     Cervical back: Normal range of motion and neck supple. No rigidity.     Right lower leg: No edema.     Left lower leg: No edema.  Skin:    General: Skin is warm and dry.     Findings: No rash.  Neurological:     Mental Status: He is alert.     Comments: Awake and alert. Moves bilateral extremities purposefully with good strength.   Psychiatric:        Mood and Affect: Mood normal.    ED Results / Procedures / Treatments   Labs (all labs ordered are listed, but only abnormal results are displayed) Results for orders placed or performed during the hospital encounter of [id number]  Basic metabolic panel  Result Value Ref Range   Sodium 143 135 - 145 mmol/L   Potassium 3.6 3.5 - 5.1 mmol/L   Chloride 109 98 - 111 mmol/L   CO2 24 22 - 32 mmol/L   Glucose, Bld 108 (H) 70 - 99 mg/dL   BUN 26 (H) 8 - 23 mg/dL   Creatinine, Ser 1.09 0.61 - 1.24 mg/dL   Calcium 9.3 8.9 - 10.3 mg/dL   GFR, Estimated >60 >60 mL/min   Anion gap 10 5 - 15  CBC with Differential  Result Value Ref Range   WBC 8.8 4.0 - 10.5 K/uL   RBC 4.48 4.22 - 5.81 MIL/uL   Hemoglobin 14.4 13.0 - 17.0 g/dL   HCT 44.7 39.0 - 52.0 %   MCV 99.8 80.0 - 100.0 fL   MCH 32.1 26.0 - 34.0 pg   MCHC 32.2 30.0 - 36.0 g/dL   RDW 13.6 11.5 - 15.5 %   Platelets 229 150 - 400 K/uL   nRBC 0.0 0.0 - 0.2 %   Neutrophils Relative % 87 %   Neutro Abs 7.8 (H) 1.7 - 7.7 K/uL   Lymphocytes Relative 6 %   Lymphs Abs 0.5 (L) 0.7 - 4.0 K/uL   Monocytes Relative 4 %   Monocytes Absolute 0.4 0.1 - 1.0 K/uL   Eosinophils Relative 1 %   Eosinophils Absolute 0.0 0.0 - 0.5 K/uL   Basophils Relative 1 %   Basophils Absolute 0.1 0.0 - 0.1 K/uL   Immature Granulocytes 1 %   Abs Immature Granulocytes 0.06 0.00 - 0.07 K/uL  POC CBG, ED  Result Value Ref Range   Glucose-Capillary 86 70 - 99 mg/dL   Comment 1 Notify RN    Comment 2 Document in Chart    CT Head Wo Contrast  Result Date: 08/09/2021  CLINICAL DATA:  Altered mental  status EXAM: CT HEAD WITHOUT CONTRAST TECHNIQUE: Contiguous axial images were obtained from the base of the skull through the vertex without intravenous contrast. COMPARISON:  05/06/2021 FINDINGS: Brain: No acute intracranial findings are seen. There are no signs of bleeding. Cortical sulci are prominent. There is decreased density in the periventricular and subcortical white matter. Vascular: Unremarkable. Skull: Unremarkable. Sinuses/Orbits: There is mucosal thickening in the ethmoid sinus. There is decrease in number of air cells in the mastoids. Other: None IMPRESSION: No acute intracranial findings are seen in noncontrast CT brain. Atrophy. Small-vessel disease. Electronically Signed   By: Elmer Picker M.D.   On: 08/09/2021 10:55     EKG EKG Interpretation  Date/Time:  Monday August 09 2021 10:26:03 EST Ventricular Rate:  88 PR Interval:    QRS Duration: 116 QT Interval:  386 QTC Calculation: 467 R Axis:   37 Text Interpretation: Atrial fibrillation Incomplete right bundle branch block Non-specific ST-t changes Confirmed by Lajean Saver [phone]) on 08/09/2021 9:00:27 PM  Radiology CT Head Wo Contrast  Result Date: 08/09/2021 CLINICAL DATA:  Altered mental status EXAM: CT HEAD WITHOUT CONTRAST TECHNIQUE: Contiguous axial images were obtained from the base of the skull through the vertex without intravenous contrast. COMPARISON:  05/06/2021 FINDINGS: Brain: No acute intracranial findings are seen. There are no signs of bleeding. Cortical sulci are prominent. There is decreased density in the periventricular and subcortical white matter. Vascular: Unremarkable. Skull: Unremarkable. Sinuses/Orbits: There is mucosal thickening in the ethmoid sinus. There is decrease in number of air cells in the mastoids. Other: None IMPRESSION: No acute intracranial findings are seen in noncontrast CT brain. Atrophy. Small-vessel disease. Electronically Signed   By: Elmer Picker M.D.   On: 08/09/2021  10:55    Procedures Procedures    Medications Ordered in ED Medications - No data to display  ED Course/ Medical Decision Making/ A&P                           Medical Decision Making  Iv ns. Continuous pulse ox and cardiac monitoring. Stat labs. Imaging. Ecg.   Diff dx includes hem/cva,  metabolic derangement, uti/infection, and other serious medical conditions.   Reviewed nursing notes and prior charts for additional history. External charts reviewed. Additional hx from facility and ems.   Labs reviewed/interpreted by me - wbc normal, chem normal.   CT head reviewed/interpreted by me - no hem.   Additional labs reviewed/interpreted by me - pnd.  2302, UA/labs pending - signed out to Dr Sherry Ruffing to check pending labs, recheck pt, and dispo appropriately (anticipate likely patient able to be d/c back to snf).          Final Clinical Impression(s) / ED Diagnoses Final diagnoses:  None    Rx / DC Orders ED Discharge Orders     None         Lajean Saver, MD 08/09/21 2303

## 2021-08-09 NOTE — ED Provider Triage Note (Signed)
Emergency Medicine Provider Triage Evaluation Note  Daelen Belvedere , a 88 y.o. male  was evaluated in triage.  Pt complains of altered mental status.  Patient lives in nursing home, usually amatory baseline but 1 was unable to ambulate this morning.  Last known normal last night, on anticoagulation for A. fib.  Level 5 caveat applies, Spanish interpreter was used but did not assist with communication.  Patient responds to stimuli, does not follow commands and answer some questions inappropriately..  Review of Systems  Positive: AMS Negative:   Physical Exam  BP 101/74 (BP Location: Right Arm)    Pulse 83    Temp 97.6 F (36.4 C) (Oral)    Resp 18    SpO2 98%  Gen:   Awake, no distress   Resp:  Normal effort  MSK:   Moves extremities without difficulty.  No tenderness to the hips on palpation, no abdominal tenderness.  No appreciable lacerations or contusions to the head or neck. Other:  Patient is altered, does not follow commandS  Medical Decision Making  Medically screening exam initiated at 10:15 AM.  Appropriate orders placed.  Yitzhak Awan was informed that the remainder of the evaluation will be completed by another provider, this initial triage assessment does not replace that evaluation, and the importance of remaining in the ED until their evaluation is complete.  AMS   Theron Arista, [place] 08/09/21 1016

## 2021-08-09 NOTE — ED Triage Notes (Signed)
Patient BIB [facility] from [facility] for sudden generalized weakness. Patient at baseline able to ambulated with walker and feed himself, today patient today has decreased alertness and unable stand.  Received 500 mL NS from EMS EMS vitals BP 122/68 HR 80 irregular etCO2 25 CBG 126

## 2021-08-09 NOTE — Discharge Instructions (Addendum)
It was our pleasure to provide your ER care today - we hope that you feel better.  Drink plenty of fluids/stay well hydrated.   Follow up closely with primary care doctor in the coming week.   Return to ER if worse, new symptoms, fevers, new/severe pain, trouble breathing, or other concern.

## 2021-08-09 NOTE — ED Provider Notes (Signed)
Care assumed from Dr. Meredith Mody all.  At time of transfer of care, patient is awaiting for results of urinalysis with plans to discharge home after result has been completed.   Urinalysis does not show evidence of UTI.  Per previous plan, will discharge with instructions for PCP follow-up.  12:05 AM Just reassessed patient with Spanish interpreter and he denies other complaints.  On my assessment, heart rate is just under 100 with persistent and chronic A. fib and other vitals are reassuring.  Oxygen saturations are in the 90s on room air.  We discussed that urinalysis was reassuring as were his other labs.  Per previous plan, he will be discharged to follow-up with PCP and maintain hydration at home.  Patient agrees and will be discharged.   Anticipate discharge with PTAR.    Clinical Impression: 1. Generalized weakness   2. Fatigue, unspecified type     Disposition: Discharge  Condition: Good  I have discussed the results, Dx and Tx plan with the pt(& family if present). He/she/they expressed understanding and agree(s) with the plan. Discharge instructions discussed at great length. Strict return precautions discussed and pt &/or family have verbalized understanding of the instructions. No further questions at time of discharge.    New Prescriptions   No medications on file    Follow Up: Uvaldo Bristle, NP [address] [place] [address] [phone]          Jazmeen Axtell, [place]ary Brim, MD 08/10/21 [phone]

## 2021-08-10 LAB — RESP PANEL BY RT-PCR (FLU A&B, COVID) ARPGX2
Influenza A by PCR: NEGATIVE
Influenza B by PCR: NEGATIVE
SARS Coronavirus 2 by RT PCR: NEGATIVE

## 2021-08-10 NOTE — ED Notes (Signed)
PTAR called  

## 2021-08-24 ENCOUNTER — Other Ambulatory Visit: Payer: Self-pay

## 2021-08-24 ENCOUNTER — Non-Acute Institutional Stay: Payer: Medicare Other | Admitting: Family Medicine

## 2021-08-24 ENCOUNTER — Encounter: Payer: Self-pay | Admitting: Family Medicine

## 2021-08-24 VITALS — BP 112/60 | HR 94 | Temp 97.6°F | Resp 16

## 2021-08-24 DIAGNOSIS — Z515 Encounter for palliative care: Secondary | ICD-10-CM

## 2021-08-24 DIAGNOSIS — E43 Unspecified severe protein-calorie malnutrition: Secondary | ICD-10-CM

## 2021-08-24 DIAGNOSIS — R296 Repeated falls: Secondary | ICD-10-CM

## 2021-08-24 DIAGNOSIS — R4189 Other symptoms and signs involving cognitive functions and awareness: Secondary | ICD-10-CM

## 2021-08-24 NOTE — Progress Notes (Signed)
[facility] Consult Note Telephone: [phone]  Fax: [phone]   Date of encounter: 08/24/21 9:25 AM PATIENT NAME: Mark Moore [place] [address]   [phone] (home)  DOB: 1928-10-12 MRN: [id number] PRIMARY CARE PROVIDER:    Earmon Phoenix, NP,  [place] [address] [phone]  REFERRING PROVIDER:   Earmon Phoenix, NP [address] [place],  [place] [address] [phone]  RESPONSIBLE PARTY:    Contact Information     Name Relation Home Work Mobile   Wayne Sever Daughter   [phone]        I met face to face with patient with translator, Lemmie Evens Minoso in pt's ALF. Palliative Care was asked to follow this patient by consultation request of  Earmon Phoenix, NP to address advance care planning and complex medical decision making. This is the initial visit.          ASSESSMENT, SYMPTOM MANAGEMENT AND PLAN / RECOMMENDATIONS:   Palliative Care Encounter-Advised pt/daughter on Palliative Care function and discussed options for MOST for goals of care.  Daughter wanting comfort and not a lot of aggressive intervention.  Need to complete MOST with her when available. Refer to Hospice with goals of comfort care in ALF with de-escalation of non-essential meds. Cognitive impairment-Likely late onset dementia.   Severe protein calorie malnutrition-Weight loss 30.8 lbs from August to October.  Current weight unavailable. Loss of muscle mass and subcutaneous fat with poor intake. Noted dysphagia-refuses thickened liquids, aspiration risk.  Recommend comfort feeds prn. 4.   Gait instability-high fall risk with poor safety awareness.     Follow up Palliative Care Visit: Discussed with supervising physician who is in agreement with referral to Hospice. Follow up and admission per [facility].    This visit was coded based on medical decision making  (MDM).  PPS: 30%  HOSPICE ELIGIBILITY/DIAGNOSIS: TBD  Chief Complaint:  Interview and exam conducted with translation from [place] [facility] received a referral to provide chronic medical management, address goals of care and evaluate for Hospice services. Pt states he is tired and doesn't want to keep going to the hospital.  He is tired of taking all the medications and just doesn't want to be bothered.   HISTORY OF PRESENT ILLNESS:  Mark Moore is a 88 y.o. year old male with chronic afib, anticoagulated with cognitive impairment suggestive of dementia, persistent cough with intake of liquids indicating swallowing impairment and poor intake with significant weight loss. He has had multiple recent hospitalizations over the last year for falls, confusion, UTI and sepsis. RPR was negative, vitamin B12 was low normal at 240 on 05/07/21.  CT head from 04/2021 shows microvascular ischemic change. Daughter states that pt coughs primarily with liquid intake, thickened liquids were recommended and pt refused. Daughter states current weight is in 24s.  Pt with poor food and fluid intake. He is unaware of the day of the week, does not know why he has been back and forth to the hospital only that he has had numerous trips.  On arrival pt was noted to be walking with his feet only halfway in his shoes walking with the heels of his shoes flattened and shoes untied. Facility staff state he is resistive to being assisted to put the shoes on correctly. Initially he refused medication but then did allow it to be given indicating he was "tired of all the medication." He was noted to  cough after taking medication and drinking water.  Daughter, Wilhemena Durie was not present for the interview and exam but this provider spoke to her by phone.  Says her mother passed away and that is when the patient started a significant decline.  Her wish for patient mirrors his that he be allowed to remain at the  facility with goal of comfort care and she is interested in pt being supported by Hospice. Pt states he has to have help with bathing, dressing.  He asked for help to return to bed.  History obtained from review of EMR/information from primary team and interview with, facility staff, daughter Wilhemena Durie and/or Mr. Humberto Leep.  I reviewed available labs, medications, imaging, studies and related documents from the EMR.  Records reviewed and summarized above.   ROS limited with cognitive impairment General: NAD, endorses fatigue and poor sleep, frequent awakening Denies pain and states "frequent stool" but cannot characterize    Physical Exam: Current and past weights: 03/09/21 weight 81 kg BMI 26.37 (Ht 5' 9"), 05/12/21 weight 147 lbs 11.3 ounces (67 kg) Constitutional: NAD General: frail appearing, thin EYES: anicteric sclera, lids intact, no discharge, watery with recent eye drop application ENMT: hard of hearing, oral mucous membranes moist, dentition intact CV: S1S2, IRIR, no LE edema Pulmonary: CTAB, no increased work of breathing, no cough, room air Abdomen: hyper-active BS + 4 quadrants, soft and non tender, no ascites GU: deferred MSK: noted prominent vertebrae, loss of muscle mass and subcutaneous fat of trunk and extremities, moves all extremities, ambulatory with unsteady gait and use of rolling walker Skin: warm and dry, no rashes or wounds on visible skin Neuro:  noted generalized weakness/deconditioning, alert and oriented to self only, confusion Psych: non-anxious, depressed affect Hem/lymph/immuno: no widespread bruising  CURRENT PROBLEM LIST:  Patient Active Problem List   Diagnosis Date Noted   Abnormal gait    Falls frequently    Pyuria 05/08/2021   Hard of hearing 05/08/2021   Urinary tract infection without hematuria    AMS (altered mental status) 05/06/2021   ESBL (extended spectrum beta-lactamase) producing bacteria infection 03/09/2021   Altered mental status     Acute encephalopathy 91/47/8295   Acute metabolic encephalopathy 62/13/0865   Atrial fibrillation, chronic ([place]) 01/17/2021   Fall (on)(from) sidewalk curb, initial encounter 01/17/2021   Cognitive impairment 01/17/2021   Depression 01/17/2021   Dyslipidemia 01/17/2021   Glaucoma 01/17/2021   DNR (do not resuscitate) 01/17/2021   Hyperlipidemia 12/03/2020   Hypertension 12/02/2020   Depression 12/02/2020   GERD (gastroesophageal reflux disease) 12/02/2020   BPH (benign prostatic hyperplasia) 12/02/2020   PAST MEDICAL HISTORY:  Active Ambulatory Problems    Diagnosis Date Noted   Hypertension 12/02/2020   Depression 12/02/2020   GERD (gastroesophageal reflux disease) 12/02/2020   BPH (benign prostatic hyperplasia) 12/02/2020   Hyperlipidemia 78/46/9629   Acute metabolic encephalopathy 52/84/1324   Atrial fibrillation, chronic ([place]) 01/17/2021   Fall (on)(from) sidewalk curb, initial encounter 01/17/2021   Cognitive impairment 01/17/2021   Depression 01/17/2021   Dyslipidemia 01/17/2021   Glaucoma 01/17/2021   DNR (do not resuscitate) 01/17/2021   Acute encephalopathy 01/18/2021   ESBL (extended spectrum beta-lactamase) producing bacteria infection 03/09/2021   Altered mental status    AMS (altered mental status) 05/06/2021   Urinary tract infection without hematuria    Pyuria 05/08/2021   Hard of hearing 05/08/2021   Abnormal gait    Falls frequently    Resolved Ambulatory Problems    Diagnosis Date Noted  Atrial fibrillation ([place]) 12/02/2020   Thoracic compression fracture, closed, initial encounter ([place]) 01/17/2021   Past Medical History:  Diagnosis Date   Dementia ([place])    SOCIAL HX:  Social History   Tobacco Use   Smoking status: Never   Smokeless tobacco: Never  Substance Use Topics   Alcohol use: Not Currently   FAMILY HX: History reviewed. No pertinent family history.     Preferred Pharmacy: ALLERGIES:  Allergies  Allergen Reactions    Wellbutrin [Bupropion] Other (See Comments)    XL 150 mg- Made the patient feel paranoid and have borderline hallucinations     PERTINENT MEDICATIONS:  Outpatient Encounter Medications as of 08/24/2021  Medication Sig   acetaminophen (TYLENOL) 500 MG tablet Take 1,000 mg by mouth 2 (two) times daily as needed for mild pain.   amoxicillin-clavulanate (AUGMENTIN) 500-125 MG tablet Take 1 tablet by mouth See admin instructions. Bid x 7 days (Patient not taking: Reported on 08/09/2021)   atorvastatin (LIPITOR) 10 MG tablet Take 1 tablet (10 mg total) by mouth daily.   brimonidine (ALPHAGAN) 0.2 % ophthalmic solution Place 1 drop into both eyes 3 (three) times daily.   diltiazem (CARDIZEM) 30 MG tablet Take 1 tablet (30 mg total) by mouth in the morning and at bedtime.   dorzolamide (TRUSOPT) 2 % ophthalmic solution Place 1 drop into both eyes 2 (two) times daily.   ELIQUIS 5 MG TABS tablet Take 5 mg by mouth 2 (two) times daily.   escitalopram (LEXAPRO) 10 MG tablet Take 10 mg by mouth daily.   famotidine (PEPCID) 20 MG tablet Take 1 tablet (20 mg total) by mouth 2 (two) times daily. (Patient not taking: Reported on 08/09/2021)   famotidine (PEPCID) 40 MG tablet Take 1 tablet (40 mg total) by mouth every evening. (Patient not taking: Reported on 08/09/2021)   finasteride (PROSCAR) 5 MG tablet Take 1 tablet (5 mg total) by mouth daily.   Lactobacillus (ACIDOPHILUS PROBIOTIC PO) Take 1 capsule by mouth in the morning and at bedtime.   nitrofurantoin (MACRODANTIN) 50 MG capsule Take 50 mg by mouth daily. Continuously   pantoprazole (PROTONIX) 20 MG tablet Take 20 mg by mouth daily.   spironolactone (ALDACTONE) 25 MG tablet Take 25 mg by mouth daily.   tamsulosin (FLOMAX) 0.4 MG CAPS capsule Take 0.4 mg by mouth daily.   vitamin B-12 (CYANOCOBALAMIN) 1000 MCG tablet Take 1,000 mcg by mouth daily.   No facility-administered encounter medications on file as of 08/24/2021.      ---------------------------------------------------------------------------------------------------------------------------------------------------------------------------------------------------------------- Advance Care Planning/Goals of Care: Goals include to maximize quality of life and symptom management. Health care surrogate gave her permission to discuss.Our advance care planning conversation included a discussion about:    The value and importance of advance care planning-has DNR Experiences with loved ones who have been seriously ill or have died -wife passed away last year and he has gone down hill since per daughter Exploration of personal, cultural or spiritual beliefs that might influence medical decisions  Exploration of goals of care in the event of a sudden injury or illness -wants pt kept comfortable in his facility and not transferred to hospital.  Daughter Wilhemena Durie was unsure about IV fluids or antibiotics but did not want intubation or feeding tube. Identification  of a healthcare agent -Ana, his daughter Reviewed MOST form with daughter over the phone Decision not to resuscitate or to de-escalate disease focused treatments due to poor prognosis. CODE STATUS: DNR  I spent 20 minutes (7 minutes  with patient and 13 minutes with daughter) providing this consultation providing Palliative Care counseling on goals of care. More than 50% of the time in this consultation was spent in counseling and care coordination. Time 9:10-9:17 am with patient, 11:46 am-11:59 am with daughter.   Thank you for the opportunity to participate in the care of Mr. Humberto Leep.  The palliative care team will continue to follow. Please call our office at [phone] if we can be of additional assistance.   Marijo Conception, FNP-C  COVID-19 PATIENT SCREENING TOOL Asked and negative response unless otherwise noted:  Have you had symptoms of covid, tested positive or been in contact with someone  with symptoms/positive test in the past 5-10 days? unknown

## 2021-08-25 DIAGNOSIS — E43 Unspecified severe protein-calorie malnutrition: Secondary | ICD-10-CM | POA: Insufficient documentation

## 2022-09-13 ENCOUNTER — Inpatient Hospital Stay (HOSPITAL_COMMUNITY)
Admission: EM | Admit: 2022-09-13 | Discharge: 2022-09-16 | DRG: 070 | Disposition: A | Discharge status: Skilled Nursing Facility | Attending: Internal Medicine | Admitting: Internal Medicine

## 2022-09-13 ENCOUNTER — Emergency Department (HOSPITAL_COMMUNITY)

## 2022-09-13 DIAGNOSIS — F32A Depression, unspecified: Secondary | ICD-10-CM | POA: Diagnosis present

## 2022-09-13 DIAGNOSIS — G9341 Metabolic encephalopathy: Secondary | ICD-10-CM | POA: Diagnosis not present

## 2022-09-13 DIAGNOSIS — W19XXXA Unspecified fall, initial encounter: Secondary | ICD-10-CM | POA: Diagnosis present

## 2022-09-13 DIAGNOSIS — E785 Hyperlipidemia, unspecified: Secondary | ICD-10-CM | POA: Diagnosis present

## 2022-09-13 DIAGNOSIS — N390 Urinary tract infection, site not specified: Secondary | ICD-10-CM | POA: Diagnosis present

## 2022-09-13 DIAGNOSIS — Z7901 Long term (current) use of anticoagulants: Secondary | ICD-10-CM

## 2022-09-13 DIAGNOSIS — Z515 Encounter for palliative care: Secondary | ICD-10-CM

## 2022-09-13 DIAGNOSIS — E876 Hypokalemia: Secondary | ICD-10-CM | POA: Diagnosis present

## 2022-09-13 DIAGNOSIS — E43 Unspecified severe protein-calorie malnutrition: Secondary | ICD-10-CM | POA: Diagnosis present

## 2022-09-13 DIAGNOSIS — S0990XA Unspecified injury of head, initial encounter: Secondary | ICD-10-CM

## 2022-09-13 DIAGNOSIS — J189 Pneumonia, unspecified organism: Principal | ICD-10-CM | POA: Diagnosis present

## 2022-09-13 DIAGNOSIS — R1310 Dysphagia, unspecified: Secondary | ICD-10-CM | POA: Diagnosis present

## 2022-09-13 DIAGNOSIS — I10 Essential (primary) hypertension: Secondary | ICD-10-CM | POA: Diagnosis present

## 2022-09-13 DIAGNOSIS — S51012A Laceration without foreign body of left elbow, initial encounter: Secondary | ICD-10-CM | POA: Diagnosis present

## 2022-09-13 DIAGNOSIS — A499 Bacterial infection, unspecified: Secondary | ICD-10-CM | POA: Diagnosis present

## 2022-09-13 DIAGNOSIS — Z66 Do not resuscitate: Secondary | ICD-10-CM | POA: Diagnosis present

## 2022-09-13 DIAGNOSIS — S0191XA Laceration without foreign body of unspecified part of head, initial encounter: Secondary | ICD-10-CM | POA: Diagnosis present

## 2022-09-13 DIAGNOSIS — Z79899 Other long term (current) drug therapy: Secondary | ICD-10-CM

## 2022-09-13 DIAGNOSIS — J9811 Atelectasis: Secondary | ICD-10-CM | POA: Diagnosis present

## 2022-09-13 DIAGNOSIS — I482 Chronic atrial fibrillation, unspecified: Secondary | ICD-10-CM | POA: Diagnosis present

## 2022-09-13 DIAGNOSIS — R296 Repeated falls: Secondary | ICD-10-CM | POA: Diagnosis present

## 2022-09-13 DIAGNOSIS — K219 Gastro-esophageal reflux disease without esophagitis: Secondary | ICD-10-CM | POA: Diagnosis present

## 2022-09-13 DIAGNOSIS — H409 Unspecified glaucoma: Secondary | ICD-10-CM | POA: Diagnosis present

## 2022-09-13 DIAGNOSIS — Z87440 Personal history of urinary (tract) infections: Secondary | ICD-10-CM

## 2022-09-13 DIAGNOSIS — E872 Acidosis, unspecified: Secondary | ICD-10-CM | POA: Diagnosis present

## 2022-09-13 DIAGNOSIS — Z888 Allergy status to other drugs, medicaments and biological substances status: Secondary | ICD-10-CM

## 2022-09-13 DIAGNOSIS — E860 Dehydration: Secondary | ICD-10-CM | POA: Diagnosis present

## 2022-09-13 DIAGNOSIS — R64 Cachexia: Secondary | ICD-10-CM | POA: Diagnosis present

## 2022-09-13 DIAGNOSIS — F0393 Unspecified dementia, unspecified severity, with mood disturbance: Secondary | ICD-10-CM | POA: Diagnosis present

## 2022-09-13 DIAGNOSIS — F0394 Unspecified dementia, unspecified severity, with anxiety: Secondary | ICD-10-CM | POA: Diagnosis present

## 2022-09-13 DIAGNOSIS — Z681 Body mass index (BMI) 19 or less, adult: Secondary | ICD-10-CM

## 2022-09-13 DIAGNOSIS — Z7401 Bed confinement status: Secondary | ICD-10-CM

## 2022-09-13 DIAGNOSIS — N400 Enlarged prostate without lower urinary tract symptoms: Secondary | ICD-10-CM | POA: Diagnosis present

## 2022-09-13 LAB — CBC WITH DIFFERENTIAL/PLATELET
Abs Immature Granulocytes: 0.2 10*3/uL — ABNORMAL HIGH (ref 0.00–0.07)
Basophils Absolute: 0.1 10*3/uL (ref 0.0–0.1)
Basophils Relative: 1 %
Eosinophils Absolute: 0 10*3/uL (ref 0.0–0.5)
Eosinophils Relative: 0 %
HCT: 38.6 % — ABNORMAL LOW (ref 39.0–52.0)
Hemoglobin: 13.1 g/dL (ref 13.0–17.0)
Immature Granulocytes: 2 %
Lymphocytes Relative: 8 %
Lymphs Abs: 0.9 10*3/uL (ref 0.7–4.0)
MCH: 32.4 pg (ref 26.0–34.0)
MCHC: 33.9 g/dL (ref 30.0–36.0)
MCV: 95.5 fL (ref 80.0–100.0)
Monocytes Absolute: 0.6 10*3/uL (ref 0.1–1.0)
Monocytes Relative: 5 %
Neutro Abs: 10.3 10*3/uL — ABNORMAL HIGH (ref 1.7–7.7)
Neutrophils Relative %: 84 %
Platelets: 227 10*3/uL (ref 150–400)
RBC: 4.04 MIL/uL — ABNORMAL LOW (ref 4.22–5.81)
RDW: 14.9 % (ref 11.5–15.5)
WBC: 12.1 10*3/uL — ABNORMAL HIGH (ref 4.0–10.5)
nRBC: 0 % (ref 0.0–0.2)

## 2022-09-13 NOTE — ED Triage Notes (Signed)
Attempted to use interpreter, pt unable to understand.

## 2022-09-13 NOTE — ED Triage Notes (Signed)
Pt arrives from [facility], pt resides on dementia unit. He had un witnessed fall, found on the floor near the bathroom. Pt is spanish speaking. Left sided head abrasion and hematoma, left shoulder abrasion. Skin tear at base of elbow. No blood thinners. Afib, lung sounds clear, cbg 109, ra 98%. 188/100.

## 2022-09-13 NOTE — ED Provider Notes (Signed)
[facility] Provider Note   CSN: [id number] Arrival date & time: 09/13/22  2155     History {Add pertinent medical, surgical, social history, OB history to HPI:1} Chief Complaint  Patient presents with   Mark Moore    Kenjuan Hrivnak is a 89 y.o. male.  Patient sent to the emergency department from skilled nursing facility (memory care unit) after being found on the ground after presumed fall.  Patient with left-sided head laceration.       Home Medications Prior to Admission medications   Medication Sig Start Date End Date Taking? Authorizing Provider  acetaminophen (TYLENOL) 500 MG tablet Take 1,000 mg by mouth 2 (two) times daily as needed for mild pain.    [provider]  amoxicillin-clavulanate (AUGMENTIN) 500-125 MG tablet Take 1 tablet by mouth See admin instructions. Bid x 7 days Patient not taking: Reported on 08/09/2021    [provider]  atorvastatin (LIPITOR) 10 MG tablet Take 1 tablet (10 mg total) by mouth daily. 12/02/20   Colon Branch, MD  brimonidine (ALPHAGAN) 0.2 % ophthalmic solution Place 1 drop into both eyes 3 (three) times daily. 12/03/20   Colon Branch, MD  diltiazem (CARDIZEM) 30 MG tablet Take 1 tablet (30 mg total) by mouth in the morning and at bedtime. 12/02/20   Colon Branch, MD  dorzolamide (TRUSOPT) 2 % ophthalmic solution Place 1 drop into both eyes 2 (two) times daily. 12/03/20   Colon Branch, MD  ELIQUIS 5 MG TABS tablet Take 5 mg by mouth 2 (two) times daily. 03/15/21   [provider]  escitalopram (LEXAPRO) 10 MG tablet Take 10 mg by mouth daily.    [provider]  famotidine (PEPCID) 20 MG tablet Take 1 tablet (20 mg total) by mouth 2 (two) times daily. Patient not taking: Reported on 08/09/2021 05/10/21   Orvis Brill, DO  famotidine (PEPCID) 40 MG tablet Take 1 tablet (40 mg total) by mouth every evening. Patient not taking: Reported on 08/09/2021 05/10/21 05/10/22   Orvis Brill, DO  finasteride (PROSCAR) 5 MG tablet Take 1 tablet (5 mg total) by mouth daily. 12/02/20   Colon Branch, MD  Lactobacillus (ACIDOPHILUS PROBIOTIC PO) Take 1 capsule by mouth in the morning and at bedtime.    [provider]  nitrofurantoin (MACRODANTIN) 50 MG capsule Take 50 mg by mouth daily. Continuously    [provider]  pantoprazole (PROTONIX) 20 MG tablet Take 20 mg by mouth daily.    [provider]  spironolactone (ALDACTONE) 25 MG tablet Take 25 mg by mouth daily.    [provider]  tamsulosin (FLOMAX) 0.4 MG CAPS capsule Take 0.4 mg by mouth daily.    [provider]  vitamin B-12 (CYANOCOBALAMIN) 1000 MCG tablet Take 1,000 mcg by mouth daily.    [provider]      Allergies    Wellbutrin [bupropion]    Review of Systems   Review of Systems  Physical Exam Updated Vital Signs BP (!) 157/103   Pulse 77   Temp 98.2 F (36.8 C) (Oral)   Resp (!) 23   SpO2 100%  Physical Exam HENT:     Head: Contusion present.   Musculoskeletal:     Left elbow: No swelling or deformity.     Right hip: Normal.     Left hip: Normal.  Skin:    Findings: Laceration (left temporal) present.  Comments: Skin tear left elbow  Neurological:     General: No focal deficit present.     Mental Status: He is alert.     Cranial Nerves: Cranial nerves 2-12 are intact.     ED Results / Procedures / Treatments   Labs (all labs ordered are listed, but only abnormal results are displayed) Labs Reviewed  CULTURE, BLOOD (ROUTINE X 2)  CULTURE, BLOOD (ROUTINE X 2)  LACTIC ACID, PLASMA  LACTIC ACID, PLASMA  COMPREHENSIVE METABOLIC PANEL  CBC WITH DIFFERENTIAL/PLATELET  PROTIME-INR  APTT  URINALYSIS, W/ REFLEX TO CULTURE (INFECTION SUSPECTED)    EKG None  Radiology No results found.  Procedures Procedures  {Document cardiac monitor, telemetry assessment procedure when appropriate:1}  Medications Ordered in  ED Medications - No data to display  ED Course/ Medical Decision Making/ A&P   {   Click here for ABCD2, HEART and other calculatorsREFRESH Note before signing :1}                          Medical Decision Making Amount and/or Complexity of Data Reviewed Labs: ordered. Radiology: ordered. ECG/medicine tests: ordered.   ***  {Document critical care time when appropriate:1} {Document review of labs and clinical decision tools ie heart score, Chads2Vasc2 etc:1}  {Document your independent review of radiology images, and any outside records:1} {Document your discussion with family members, caretakers, and with consultants:1} {Document social determinants of health affecting pt's care:1} {Document your decision making why or why not admission, treatments were needed:1} Final Clinical Impression(s) / ED Diagnoses Final diagnoses:  None    Rx / DC Orders ED Discharge Orders     None

## 2022-09-14 ENCOUNTER — Emergency Department (HOSPITAL_COMMUNITY)

## 2022-09-14 DIAGNOSIS — F0393 Unspecified dementia, unspecified severity, with mood disturbance: Secondary | ICD-10-CM | POA: Diagnosis present

## 2022-09-14 DIAGNOSIS — I10 Essential (primary) hypertension: Secondary | ICD-10-CM | POA: Diagnosis present

## 2022-09-14 DIAGNOSIS — S0191XA Laceration without foreign body of unspecified part of head, initial encounter: Secondary | ICD-10-CM | POA: Diagnosis present

## 2022-09-14 DIAGNOSIS — Z888 Allergy status to other drugs, medicaments and biological substances status: Secondary | ICD-10-CM | POA: Diagnosis not present

## 2022-09-14 DIAGNOSIS — N400 Enlarged prostate without lower urinary tract symptoms: Secondary | ICD-10-CM | POA: Diagnosis present

## 2022-09-14 DIAGNOSIS — E860 Dehydration: Secondary | ICD-10-CM | POA: Diagnosis present

## 2022-09-14 DIAGNOSIS — E876 Hypokalemia: Secondary | ICD-10-CM | POA: Diagnosis present

## 2022-09-14 DIAGNOSIS — Z66 Do not resuscitate: Secondary | ICD-10-CM | POA: Diagnosis present

## 2022-09-14 DIAGNOSIS — H409 Unspecified glaucoma: Secondary | ICD-10-CM | POA: Diagnosis present

## 2022-09-14 DIAGNOSIS — G9341 Metabolic encephalopathy: Secondary | ICD-10-CM | POA: Diagnosis not present

## 2022-09-14 DIAGNOSIS — N3000 Acute cystitis without hematuria: Secondary | ICD-10-CM

## 2022-09-14 DIAGNOSIS — A499 Bacterial infection, unspecified: Secondary | ICD-10-CM

## 2022-09-14 DIAGNOSIS — W19XXXA Unspecified fall, initial encounter: Secondary | ICD-10-CM | POA: Diagnosis present

## 2022-09-14 DIAGNOSIS — S0990XA Unspecified injury of head, initial encounter: Secondary | ICD-10-CM | POA: Diagnosis present

## 2022-09-14 DIAGNOSIS — Z79899 Other long term (current) drug therapy: Secondary | ICD-10-CM | POA: Diagnosis not present

## 2022-09-14 DIAGNOSIS — K219 Gastro-esophageal reflux disease without esophagitis: Secondary | ICD-10-CM | POA: Diagnosis present

## 2022-09-14 DIAGNOSIS — E785 Hyperlipidemia, unspecified: Secondary | ICD-10-CM | POA: Diagnosis present

## 2022-09-14 DIAGNOSIS — J189 Pneumonia, unspecified organism: Secondary | ICD-10-CM | POA: Diagnosis present

## 2022-09-14 DIAGNOSIS — E43 Unspecified severe protein-calorie malnutrition: Secondary | ICD-10-CM

## 2022-09-14 DIAGNOSIS — I482 Chronic atrial fibrillation, unspecified: Secondary | ICD-10-CM

## 2022-09-14 DIAGNOSIS — J9811 Atelectasis: Secondary | ICD-10-CM | POA: Diagnosis present

## 2022-09-14 DIAGNOSIS — Z1612 Extended spectrum beta lactamase (ESBL) resistance: Secondary | ICD-10-CM

## 2022-09-14 DIAGNOSIS — F0394 Unspecified dementia, unspecified severity, with anxiety: Secondary | ICD-10-CM | POA: Diagnosis present

## 2022-09-14 DIAGNOSIS — Z515 Encounter for palliative care: Secondary | ICD-10-CM | POA: Diagnosis not present

## 2022-09-14 DIAGNOSIS — E872 Acidosis, unspecified: Secondary | ICD-10-CM | POA: Diagnosis present

## 2022-09-14 DIAGNOSIS — R296 Repeated falls: Secondary | ICD-10-CM | POA: Diagnosis not present

## 2022-09-14 DIAGNOSIS — R64 Cachexia: Secondary | ICD-10-CM | POA: Diagnosis present

## 2022-09-14 DIAGNOSIS — R1310 Dysphagia, unspecified: Secondary | ICD-10-CM | POA: Diagnosis present

## 2022-09-14 DIAGNOSIS — F329 Major depressive disorder, single episode, unspecified: Secondary | ICD-10-CM

## 2022-09-14 DIAGNOSIS — Z681 Body mass index (BMI) 19 or less, adult: Secondary | ICD-10-CM | POA: Diagnosis not present

## 2022-09-14 LAB — CBC WITH DIFFERENTIAL/PLATELET
Abs Immature Granulocytes: 0.09 10*3/uL — ABNORMAL HIGH (ref 0.00–0.07)
Basophils Absolute: 0.1 10*3/uL (ref 0.0–0.1)
Basophils Relative: 1 %
Eosinophils Absolute: 0 10*3/uL (ref 0.0–0.5)
Eosinophils Relative: 0 %
HCT: 34.5 % — ABNORMAL LOW (ref 39.0–52.0)
Hemoglobin: 11.4 g/dL — ABNORMAL LOW (ref 13.0–17.0)
Immature Granulocytes: 1 %
Lymphocytes Relative: 13 %
Lymphs Abs: 1.4 10*3/uL (ref 0.7–4.0)
MCH: 32.5 pg (ref 26.0–34.0)
MCHC: 33 g/dL (ref 30.0–36.0)
MCV: 98.3 fL (ref 80.0–100.0)
Monocytes Absolute: 0.6 10*3/uL (ref 0.1–1.0)
Monocytes Relative: 5 %
Neutro Abs: 9 10*3/uL — ABNORMAL HIGH (ref 1.7–7.7)
Neutrophils Relative %: 80 %
Platelets: 206 10*3/uL (ref 150–400)
RBC: 3.51 MIL/uL — ABNORMAL LOW (ref 4.22–5.81)
RDW: 15 % (ref 11.5–15.5)
WBC: 11.2 10*3/uL — ABNORMAL HIGH (ref 4.0–10.5)
nRBC: 0 % (ref 0.0–0.2)

## 2022-09-14 LAB — COMPREHENSIVE METABOLIC PANEL
ALT: 14 U/L (ref 0–44)
ALT: 13 U/L (ref 0–44)
AST: 23 U/L (ref 15–41)
AST: 25 U/L (ref 15–41)
Albumin: 3.7 g/dL (ref 3.5–5.0)
Albumin: 3.1 g/dL — ABNORMAL LOW (ref 3.5–5.0)
Alkaline Phosphatase: 84 U/L (ref 38–126)
Alkaline Phosphatase: 69 U/L (ref 38–126)
Anion gap: 12 (ref 5–15)
Anion gap: 13 (ref 5–15)
BUN: 16 mg/dL (ref 8–23)
BUN: 14 mg/dL (ref 8–23)
CO2: 27 mmol/L (ref 22–32)
CO2: 30 mmol/L (ref 22–32)
Calcium: 9.4 mg/dL (ref 8.9–10.3)
Calcium: 8.8 mg/dL — ABNORMAL LOW (ref 8.9–10.3)
Chloride: 102 mmol/L (ref 98–111)
Chloride: 99 mmol/L (ref 98–111)
Creatinine, Ser: 0.83 mg/dL (ref 0.61–1.24)
Creatinine, Ser: 0.77 mg/dL (ref 0.61–1.24)
GFR, Estimated: >60 mL/min (ref >60)
GFR, Estimated: >60 mL/min (ref >60)
Glucose, Bld: 100 mg/dL — ABNORMAL HIGH (ref 70–99)
Glucose, Bld: 98 mg/dL (ref 70–99)
Potassium: 3.1 mmol/L — ABNORMAL LOW (ref 3.5–5.1)
Potassium: 3.2 mmol/L — ABNORMAL LOW (ref 3.5–5.1)
Sodium: 141 mmol/L (ref 135–145)
Sodium: 142 mmol/L (ref 135–145)
Total Bilirubin: 0.9 mg/dL (ref 0.3–1.2)
Total Bilirubin: 0.4 mg/dL (ref 0.3–1.2)
Total Protein: 7 g/dL (ref 6.5–8.1)
Total Protein: 5.8 g/dL — ABNORMAL LOW (ref 6.5–8.1)

## 2022-09-14 LAB — URINALYSIS, W/ REFLEX TO CULTURE (INFECTION SUSPECTED)
Bilirubin Urine: NEGATIVE
Glucose, UA: NEGATIVE mg/dL
Hgb urine dipstick: NEGATIVE
Ketones, ur: NEGATIVE mg/dL
Nitrite: NEGATIVE
Protein, ur: 30 mg/dL — AB
Specific Gravity, Urine: 1.005 (ref 1.005–1.030)
WBC, UA: >50 WBC/hpf (ref 0–5)
pH: 8 (ref 5.0–8.0)

## 2022-09-14 LAB — APTT: aPTT: 30 seconds (ref 24–36)

## 2022-09-14 LAB — URINE CULTURE: Culture: <10000 — AB

## 2022-09-14 LAB — LACTIC ACID, PLASMA
Lactic Acid, Venous: 2.2 mmol/L (ref 0.5–1.9)
Lactic Acid, Venous: 2.3 mmol/L (ref 0.5–1.9)

## 2022-09-14 LAB — PROTIME-INR
INR: 1.1 (ref 0.8–1.2)
Prothrombin Time: 13.8 seconds (ref 11.4–15.2)

## 2022-09-14 LAB — MAGNESIUM: Magnesium: 1.8 mg/dL (ref 1.7–2.4)

## 2022-09-14 MED ORDER — BRIMONIDINE TARTRATE 0.2 % OP SOLN
1.0000 [drp] | Freq: Three times a day (TID) | OPHTHALMIC | Status: DC
  Administered 2022-09-15 – 2022-09-16 (×3): 1 [drp] via OPHTHALMIC
  Filled 2022-09-14 (×4): qty 5

## 2022-09-14 MED ORDER — POTASSIUM CHLORIDE 20 MEQ PO PACK
40.0000 meq | PACK | Freq: Once | ORAL | Status: AC
  Administered 2022-09-14: 40 meq via ORAL
  Filled 2022-09-14: qty 2

## 2022-09-14 MED ORDER — DILTIAZEM HCL 30 MG PO TABS
30.0000 mg | ORAL_TABLET | Freq: Two times a day (BID) | ORAL | Status: DC
  Administered 2022-09-14 – 2022-09-16 (×4): 30 mg via ORAL
  Filled 2022-09-14 (×5): qty 1

## 2022-09-14 MED ORDER — VITAMIN B-12 1000 MCG PO TABS: 1000.0000 ug | ORAL_TABLET | Freq: Every day | ORAL | Status: DC

## 2022-09-14 MED ORDER — SODIUM CHLORIDE 0.9 % IV SOLN: 500.0000 mg | INTRAVENOUS | Status: DC

## 2022-09-14 MED ORDER — FINASTERIDE 5 MG PO TABS
5.0000 mg | ORAL_TABLET | Freq: Every day | ORAL | Status: DC
  Administered 2022-09-15 – 2022-09-16 (×2): 5 mg via ORAL
  Filled 2022-09-14 (×2): qty 1

## 2022-09-14 MED ORDER — SODIUM CHLORIDE 0.9 % IV SOLN
1.0000 g | INTRAVENOUS | Status: DC
  Administered 2022-09-14 – 2022-09-15 (×2): 1 g via INTRAVENOUS
  Filled 2022-09-14 (×2): qty 10

## 2022-09-14 MED ORDER — SODIUM CHLORIDE 0.9 % IV SOLN
500.0000 mg | Freq: Once | INTRAVENOUS | Status: AC
  Administered 2022-09-14: 500 mg via INTRAVENOUS
  Filled 2022-09-14: qty 5

## 2022-09-14 MED ORDER — HYDRALAZINE HCL 20 MG/ML IJ SOLN
5.0000 mg | Freq: Four times a day (QID) | INTRAMUSCULAR | Status: DC | PRN
  Administered 2022-09-15: 5 mg via INTRAVENOUS
  Filled 2022-09-14: qty 1

## 2022-09-14 MED ORDER — METOPROLOL TARTRATE 5 MG/5ML IV SOLN: 5.0000 mg | Freq: Four times a day (QID) | INTRAVENOUS | Status: DC | PRN

## 2022-09-14 MED ORDER — LACTATED RINGERS IV BOLUS
500.0000 mL | Freq: Once | INTRAVENOUS | Status: AC
  Administered 2022-09-14: 500 mL via INTRAVENOUS

## 2022-09-14 MED ORDER — POLYETHYLENE GLYCOL 3350 17 G PO PACK
17.0000 g | PACK | Freq: Every day | ORAL | Status: DC
  Administered 2022-09-14 – 2022-09-16 (×3): 17 g via ORAL
  Filled 2022-09-14 (×3): qty 1

## 2022-09-14 MED ORDER — SPIRONOLACTONE 12.5 MG HALF TABLET: 25.0000 mg | ORAL_TABLET | Freq: Every day | ORAL | Status: DC

## 2022-09-14 MED ORDER — TAMSULOSIN HCL 0.4 MG PO CAPS: 0.4000 mg | ORAL_CAPSULE | Freq: Every day | ORAL | Status: DC

## 2022-09-14 MED ORDER — ACETAMINOPHEN 650 MG RE SUPP: 650.0000 mg | Freq: Four times a day (QID) | RECTAL | Status: DC | PRN

## 2022-09-14 MED ORDER — DORZOLAMIDE HCL 2 % OP SOLN
1.0000 [drp] | Freq: Two times a day (BID) | OPHTHALMIC | Status: DC
  Administered 2022-09-15 – 2022-09-16 (×2): 1 [drp] via OPHTHALMIC
  Filled 2022-09-14 (×2): qty 10

## 2022-09-14 MED ORDER — LIDOCAINE HCL URETHRAL/MUCOSAL 2 % EX GEL
1.0000 | Freq: Once | CUTANEOUS | Status: AC
  Administered 2022-09-14: 1 via TOPICAL
  Filled 2022-09-14: qty 11

## 2022-09-14 MED ORDER — SODIUM CHLORIDE 0.9 % IV SOLN
2.0000 g | Freq: Once | INTRAVENOUS | Status: AC
  Administered 2022-09-14: 2 g via INTRAVENOUS
  Filled 2022-09-14: qty 20

## 2022-09-14 MED ORDER — PANTOPRAZOLE SODIUM 20 MG PO TBEC
20.0000 mg | DELAYED_RELEASE_TABLET | Freq: Every day | ORAL | Status: DC
  Administered 2022-09-15 – 2022-09-16 (×2): 20 mg via ORAL
  Filled 2022-09-14 (×2): qty 1

## 2022-09-14 MED ORDER — POTASSIUM CHLORIDE 10 MEQ/100ML IV SOLN
10.0000 meq | INTRAVENOUS | Status: AC
  Filled 2022-09-14: qty 100

## 2022-09-14 MED ORDER — APIXABAN 5 MG PO TABS: 5.0000 mg | ORAL_TABLET | Freq: Two times a day (BID) | ORAL | Status: DC

## 2022-09-14 MED ORDER — MELATONIN 3 MG PO TABS: 3.0000 mg | ORAL_TABLET | Freq: Every evening | ORAL | Status: DC | PRN

## 2022-09-14 MED ORDER — ATORVASTATIN CALCIUM 10 MG PO TABS: 10.0000 mg | ORAL_TABLET | Freq: Every day | ORAL | Status: DC

## 2022-09-14 MED ORDER — MAGNESIUM SULFATE IN D5W 1-5 GM/100ML-% IV SOLN
1.0000 g | Freq: Once | INTRAVENOUS | Status: AC
  Administered 2022-09-14: 1 g via INTRAVENOUS
  Filled 2022-09-14: qty 100

## 2022-09-14 MED ORDER — ACETAMINOPHEN 325 MG PO TABS: 650.0000 mg | ORAL_TABLET | Freq: Four times a day (QID) | ORAL | Status: DC | PRN

## 2022-09-14 MED ORDER — ONDANSETRON HCL 4 MG/2ML IJ SOLN: 4.0000 mg | Freq: Four times a day (QID) | INTRAMUSCULAR | Status: DC | PRN

## 2022-09-14 MED ORDER — ESCITALOPRAM OXALATE 10 MG PO TABS
15.0000 mg | ORAL_TABLET | Freq: Every day | ORAL | Status: DC
  Administered 2022-09-14 – 2022-09-16 (×3): 15 mg via ORAL
  Filled 2022-09-14 (×3): qty 2

## 2022-09-14 MED ORDER — SODIUM CHLORIDE 0.9 % IV SOLN: INTRAVENOUS | Status: AC

## 2022-09-14 MED ORDER — ONDANSETRON HCL 4 MG PO TABS: 4.0000 mg | ORAL_TABLET | Freq: Four times a day (QID) | ORAL | Status: DC | PRN

## 2022-09-14 MED ORDER — ALBUTEROL SULFATE (2.5 MG/3ML) 0.083% IN NEBU: 2.5000 mg | INHALATION_SOLUTION | RESPIRATORY_TRACT | Status: DC | PRN

## 2022-09-14 NOTE — ED Notes (Signed)
Date and time results received: 09/14/22 0124 (use smartphrase ".now" to insert current time)  Test: lactic Critical Value: 2.2  Name of Provider Notified: [place]  Orders Received? Or Actions Taken?:  notified

## 2022-09-14 NOTE — TOC CAGE-AID Note (Signed)
Transition of Care [facility]) - CAGE-AID Screening   Patient Details  Name: Coltrane Tirabassi MRN: [id number] Date of Birth: 07-16-1929  Transition of Care [facility]) CM/SW Contact:    Army Melia, RN Phone Number:[phone] 09/14/2022, 8:47 PM  CAGE-AID Screening: Substance Abuse Screening unable to be completed due to: : Patient unable to participate (nonverbal at this time)

## 2022-09-14 NOTE — Care Plan (Signed)
Received SBAR, ready to receive patient. Sreece, RN

## 2022-09-14 NOTE — Care Plan (Signed)
Pt arrived to room via stretcher accompanied by ER.  Male wick noted, cannister set up.  He is asleep and does not respond to voice or touch.  Abrasions noted to left scalp, shoulder.  NADN.  Will continue to monitor, no family present.  Sreece, RN

## 2022-09-14 NOTE — Care Plan (Signed)
MEWS is 2, cognitive dementia, will not open eyes or speak.  NADN.  Sreece, RN

## 2022-09-14 NOTE — Progress Notes (Signed)
[facility] ED 02 [facility]) Hospital Liaison Note    Mr. Mark Moore is a current [facility] patient with a terminal diagnosis of Severe protein calorie malnutrition with abnormal weight loss. Patient had fallen and was found in the floor at his facility with a laceration present to the left side of his head. ACC was made aware. Patient was transported to the [facility] ED from [place] in the evening on 2.13. CT of head showed no injuries, but white blood cells were slightly elevated and Chest x-ray showed possible pneumonia. He was also found to have a UTI with ESBL.  Mr. Mark Moore was admitted in the morning on 2.14 with a diagnosis of acute metabolic encephalopathy. Per ACC Dr. Karie Georges, this is a related hospital admission.    Visited Mr. Mark Moore at the bedside. No family present at bedside. Patient is Spanish speaking. Patient was sleeping when I saw him in the ED. Report exchanged with RN. Spoke with his daughter via telephone to provide an update and touch base.    Mr. Mark Moore is inpatient appropriate due to requiring IV antibiotics for treatment of acute Metabolic encephalopathy.    V/S: 159/86 bp, 98.9 Temp, 86 bpm, 22 RR, 100% on RA   I/O: not recorded   Labs:  Potassium: 3.2 (L) Calcium: 8.8 (L) Albumin: 3.1 (L) Total Protein: 5.8 (L) WBC: 11.2 (H) RBC: 3.51 (L) Hemoglobin: 11.4 (L) HCT: 34.5 (L) NEUT#: 9.0 (H) Abs Immature Granulocytes: 0.09 (H) Lactic Acid, Venous: 2.3 (HH)  Diagnostics:  CT of spine IMPRESSION: 1. No acute cervical spine fracture or subluxation. 2. Marked severity multilevel degenerative changes, as described above.  CT of Head IMPRESSION: 1. Mild left parietal scalp soft tissue swelling without evidence for an acute fracture or acute intracranial abnormality. 2. Generalized cerebral atrophy with chronic white matter small vessel ischemic changes. 3. Mild sphenoid sinus and posterior right ethmoid sinus disease.  Chest x-ray IMPRESSION: 1. Mild  airspace disease at the right lung base, possible atelectasis or infiltrate. 2. Cardiomegaly.  X-ray of left elbow IMPRESSION: No acute fracture or dislocation.  IV/PRN:    lactated ringers bolus 500 mL Dose: 500 mL Freq:  Once Route: IV x1  cefTRIAXone (ROCEPHIN) 2 g in sodium chloride 0.9 % 100 mL IVPB Dose: 2 g Freq:  Once Route: IV x1  azithromycin (ZITHROMAX) 500 mg in sodium chloride 0.9 % 250 mL IVPB Dose: 500 mg Freq:  Once Route: IV x1  Problem list: Assessment and Plan:    1.  Acute metabolic encephalopathy: Secondary to recurrent UTI versus delirium versus medication effect.  Versus dehydration.  Avoid sedatives.  Continue empiric antibiotics with Rocephin and follow urine culture results.  No respiratory symptoms and chest x-ray suggestive of infiltrate versus atelectasis.  Will hold off on azithromycin for now.  Standard delirium precautions.  Resume Seroquel if worsening mental status (holding for QT interval).  Melatonin nightly as needed.  According to daughter patient gets really uncomfortable with Foley, will discontinue.  Patient has poor oral intake at baseline.  Will give ginger hydration for 24 hours.   2.  Abnormal UA with history of recurrent UTI: Afebrile but given elevated lactate and leukocytosis of 12 K, will treat empirically as discussed above.  Follow-up urine cultures as previous history of ESBL.  Patient completed Augmentin on 08/07/2021 and is on nitrofurantoin 50 mg daily as prevention.  DC Foley catheter given propensity for infections and as also requested by daughter.   3.  Recurrent falls: In the  setting of underlying dementia, problem #1 and problem #2.  Treat underlying causes.  PT/OT evaluation-check orthostatics especially in the setting of BP meds, finasteride/tamsulosin use.   4.  Hypokalemia: Replace and recheck labs in a.m.   5.  Chronic atrial fibrillation: Resume Eliquis and diltiazem.  Monitor heart rate, orthostatic vital signs.  May  need to reconsider anticoagulation with history of recurrent falls and bleeding risk   6.  Hypertension, hyperlipidemia: Resume Lipitor and BP meds (diltiazem, Aldactone) and adjust if orthostatic.  As needed meds available   7.  Abnormal chest x-ray: Right lower lobe infiltrate/atelectasis reported on chest x-ray.  Patient does not have any respiratory complaints currently.  Will hold off azithromycin for now.  Speech requested to rule out aspiration.  Resume pured diet.  Incentive spirometry can be considered  if tolerates. Nebs PRN.   8.  Depression/dementia: Resume home med Lexapro but will hold Seroquel for now with prolonged QTc.  Delirium precautions   9. Glaucoma: Resume home meds/eyedrops   10.  Prolonged QT interval: Current potassium level.  Keep magnesium close to 2.  Repeat EKG in AM.  Hold Seroquel for now.  On diltiazem   11.  BPH: Resume tamsulosin and finasteride.  Avoid Foley if possible.  Incontinent at baseline.   12.  Severe protein calorie malnutrition history: Patient now on hospice.  Does appear cachectic.  Resume pured diet and protein supplementation.  GOC: Patient is a DNR.     D/C planning: Ongoing.     Family: Spoke to daughter via phone.    IDT: Updated   Should patient need ambulance transfer - Please use [facility]) as they contract this service for our active hospice patients.    Zigmund Gottron, RN [facility] Liaison  [phone]

## 2022-09-14 NOTE — ED Notes (Signed)
Pt brought to room 2. This RN taking over care of pt.  Pt condom cath fell off and brief saturated in urine. Linens changed, brief changed, peri care provided and external male catheter applied. Warm blankets applied

## 2022-09-14 NOTE — ED Notes (Signed)
RN spoke with pharmacy regarding medication verification. Pharmacy waiting on med req before verifying and has asked pharmacy tech to complete. RN awaiting MAR update

## 2022-09-14 NOTE — ED Notes (Signed)
ED TO INPATIENT HANDOFF REPORT  ED Nurse Name and Phone #: Threasa Beards [phone]  S Name/Age/Gender Mark Moore 89 y.o. male Room/Bed: 002C/002C  Code Status   Code Status: DNR  Home/SNF/Other Nursing Home Patient oriented to: self Is this baseline? Yes   Triage Complete: Triage complete  Chief Complaint Acute metabolic encephalopathy [id number]  Triage Note Pt arrives from [place], pt resides on dementia unit. He had un witnessed fall, found on the floor near the bathroom. Pt is spanish speaking. Left sided head abrasion and hematoma, left shoulder abrasion. Skin tear at base of elbow. No blood thinners. Afib, lung sounds clear, cbg 109, ra 98%. 188/100.  Attempted to use interpreter, pt unable to understand.    Allergies Allergies  Allergen Reactions   Wellbutrin [Bupropion] Other (See Comments)    Paranoia  Hallucinations Not documented on MAR    Level of Care/Admitting Diagnosis ED Disposition     ED Disposition  Admit   Condition  --   [place]: [facility] [[id number]]  Level of Care: Telemetry Medical [[id number]]  May admit patient to [facility] or [facility] if equivalent level of care is available:: No  Covid Evaluation: Asymptomatic - no recent exposure (last 10 days) testing not required  Diagnosis: Acute metabolic encephalopathy [id number]  Admitting Physician: Rhetta Mura [id number]  Attending Physician: Rhetta Mura [id number]  Certification:: I certify this patient will need inpatient services for at least 2 midnights  Estimated Length of Stay: 2          B Medical/Surgery History Past Medical History:  Diagnosis Date   Atrial fibrillation ([place])    Atrial fibrillation, chronic (HCC)    BPH (benign prostatic hyperplasia)    Dementia ([place])    Depression    DNR (do not resuscitate) 01/17/2021   Dyslipidemia    GERD (gastroesophageal reflux disease)    Glaucoma    Hypertension    Thoracic  compression fracture, closed, initial encounter ([place]) 01/17/2021   Past Surgical History:  Procedure Laterality Date   NO PAST SURGERIES     none       A IV Location/Drains/Wounds Patient Lines/Drains/Airways Status     Active Line/Drains/Airways     Name Placement date Placement time Site Days   Peripheral IV 09/13/22 20 G Anterior;Right Forearm 09/13/22  2315  Forearm  1   Peripheral IV 09/14/22 20 G Anterior;Left Forearm 09/14/22  0018  Forearm  less than 1   External Urinary Catheter 09/14/22  0740  --  less than 1            Intake/Output Last 24 hours  Intake/Output Summary (Last 24 hours) at 09/14/2022 1504 Last data filed at 09/14/2022 1459 Gross per 24 hour  Intake --  Output 780 ml  Net -780 ml    Labs/Imaging Results for orders placed or performed during the hospital encounter of 09/13/22 (from the past 48 hour(s))  Comprehensive metabolic panel     Status: Abnormal   Collection Time: 09/13/22 11:10 PM  Result Value Ref Range   Sodium 141 135 - 145 mmol/L   Potassium 3.1 (L) 3.5 - 5.1 mmol/L   Chloride 102 98 - 111 mmol/L   CO2 27 22 - 32 mmol/L   Glucose, Bld 100 (H) 70 - 99 mg/dL    Comment: Glucose reference range applies only to samples taken after fasting for at least 8 hours.   BUN 16 8 - 23 mg/dL  Creatinine, Ser 0.83 0.61 - 1.24 mg/dL   Calcium 9.4 8.9 - 10.3 mg/dL   Total Protein 7.0 6.5 - 8.1 g/dL   Albumin 3.7 3.5 - 5.0 g/dL   AST 23 15 - 41 U/L   ALT 14 0 - 44 U/L   Alkaline Phosphatase 84 38 - 126 U/L   Total Bilirubin 0.9 0.3 - 1.2 mg/dL   GFR, Estimated >60 >60 mL/min    Comment: (NOTE) Calculated using the CKD-EPI Creatinine Equation (2021)    Anion gap 12 5 - 15    Comment: Performed at [facility] [address], [place], [place] [address]  CBC with Differential     Status: Abnormal   Collection Time: 09/13/22 11:10 PM  Result Value Ref Range   WBC 12.1 (H) 4.0 - 10.5 K/uL   RBC 4.04 (L) 4.22 - 5.81 MIL/uL    Hemoglobin 13.1 13.0 - 17.0 g/dL   HCT 38.6 (L) 39.0 - 52.0 %   MCV 95.5 80.0 - 100.0 fL   MCH 32.4 26.0 - 34.0 pg   MCHC 33.9 30.0 - 36.0 g/dL   RDW 14.9 11.5 - 15.5 %   Platelets 227 150 - 400 K/uL   nRBC 0.0 0.0 - 0.2 %   Neutrophils Relative % 84 %   Neutro Abs 10.3 (H) 1.7 - 7.7 K/uL   Lymphocytes Relative 8 %   Lymphs Abs 0.9 0.7 - 4.0 K/uL   Monocytes Relative 5 %   Monocytes Absolute 0.6 0.1 - 1.0 K/uL   Eosinophils Relative 0 %   Eosinophils Absolute 0.0 0.0 - 0.5 K/uL   Basophils Relative 1 %   Basophils Absolute 0.1 0.0 - 0.1 K/uL   Immature Granulocytes 2 %   Abs Immature Granulocytes 0.20 (H) 0.00 - 0.07 K/uL    Comment: Performed at [facility] [address] [place], [place] [address]  Protime-INR     Status: None   Collection Time: 09/13/22 11:10 PM  Result Value Ref Range   Prothrombin Time 13.8 11.4 - 15.2 seconds   INR 1.1 0.8 - 1.2    Comment: (NOTE) INR goal varies based on device and disease states. Performed at [facility], [place] [address], [place], [place] [address]   APTT     Status: None   Collection Time: 09/13/22 11:10 PM  Result Value Ref Range   aPTT 30 24 - 36 seconds    Comment: Performed at [facility] [address], [place],  [address]  Urinalysis, w/ Reflex to Culture (Infection Suspected) -Urine, Catheterized     Status: Abnormal   Collection Time: 09/13/22 11:45 PM  Result Value Ref Range   Specimen Source URINE, CATHETERIZED    Color, Urine STRAW (A) YELLOW   APPearance CLEAR CLEAR   Specific Gravity, Urine 1.005 1.005 - 1.030   pH 8.0 5.0 - 8.0   Glucose, UA NEGATIVE NEGATIVE mg/dL   Hgb urine dipstick NEGATIVE NEGATIVE   Bilirubin Urine NEGATIVE NEGATIVE   Ketones, ur NEGATIVE NEGATIVE mg/dL   Protein, ur 30 (A) NEGATIVE mg/dL   Nitrite NEGATIVE NEGATIVE   Leukocytes,Ua MODERATE (A) NEGATIVE   RBC / HPF 0-5 0 - 5 RBC/hpf   WBC, UA >50 0 - 5 WBC/hpf    Comment:        Reflex urine culture not  performed if WBC <=10, OR if Squamous epithelial cells >5. If Squamous epithelial cells >5 suggest recollection.  Bacteria, UA FEW (A) NONE SEEN   Squamous Epithelial / HPF 0-5 0 - 5 /HPF    Comment: Performed at [facility], [place] [address], [place], [place] [address]  Lactic acid, plasma     Status: Abnormal   Collection Time: 09/14/22 12:10 AM  Result Value Ref Range   Lactic Acid, Venous 2.2 (HH) 0.5 - 1.9 mmol/L    Comment: CRITICAL RESULT CALLED TO, READ BACK BY AND VERIFIED WITH Odella Aquas RN 09/14/22 0124 Wiliam Ke Performed at [facility], [place] [address], [place], [place] [address]   Lactic acid, plasma     Status: Abnormal   Collection Time: 09/14/22  1:28 AM  Result Value Ref Range   Lactic Acid, Venous 2.3 (HH) 0.5 - 1.9 mmol/L    Comment: CRITICAL VALUE NOTED. VALUE IS CONSISTENT WITH PREVIOUSLY REPORTED/CALLED VALUE Performed at [facility], [place] [address], [place], [place] [address]   CBC with Differential/Platelet     Status: Abnormal   Collection Time: 09/14/22  4:05 AM  Result Value Ref Range   WBC 11.2 (H) 4.0 - 10.5 K/uL   RBC 3.51 (L) 4.22 - 5.81 MIL/uL   Hemoglobin 11.4 (L) 13.0 - 17.0 g/dL   HCT 34.5 (L) 39.0 - 52.0 %   MCV 98.3 80.0 - 100.0 fL   MCH 32.5 26.0 - 34.0 pg   MCHC 33.0 30.0 - 36.0 g/dL   RDW 15.0 11.5 - 15.5 %   Platelets 206 150 - 400 K/uL   nRBC 0.0 0.0 - 0.2 %   Neutrophils Relative % 80 %   Neutro Abs 9.0 (H) 1.7 - 7.7 K/uL   Lymphocytes Relative 13 %   Lymphs Abs 1.4 0.7 - 4.0 K/uL   Monocytes Relative 5 %   Monocytes Absolute 0.6 0.1 - 1.0 K/uL   Eosinophils Relative 0 %   Eosinophils Absolute 0.0 0.0 - 0.5 K/uL   Basophils Relative 1 %   Basophils Absolute 0.1 0.0 - 0.1 K/uL   Immature Granulocytes 1 %   Abs Immature Granulocytes 0.09 (H) 0.00 - 0.07 K/uL    Comment: Performed at [facility] [address], [place], [place] [address]  Comprehensive metabolic panel     Status: Abnormal    Collection Time: 09/14/22  4:05 AM  Result Value Ref Range   Sodium 142 135 - 145 mmol/L   Potassium 3.2 (L) 3.5 - 5.1 mmol/L    Comment: HEMOLYSIS AT THIS LEVEL MAY AFFECT RESULT   Chloride 99 98 - 111 mmol/L   CO2 30 22 - 32 mmol/L   Glucose, Bld 98 70 - 99 mg/dL    Comment: Glucose reference range applies only to samples taken after fasting for at least 8 hours.   BUN 14 8 - 23 mg/dL   Creatinine, Ser 0.77 0.61 - 1.24 mg/dL   Calcium 8.8 (L) 8.9 - 10.3 mg/dL   Total Protein 5.8 (L) 6.5 - 8.1 g/dL   Albumin 3.1 (L) 3.5 - 5.0 g/dL   AST 25 15 - 41 U/L    Comment: HEMOLYSIS AT THIS LEVEL MAY AFFECT RESULT   ALT 13 0 - 44 U/L    Comment: HEMOLYSIS AT THIS LEVEL MAY AFFECT RESULT   Alkaline Phosphatase 69 38 - 126 U/L   Total Bilirubin 0.4 0.3 - 1.2 mg/dL    Comment: HEMOLYSIS AT THIS LEVEL MAY AFFECT RESULT   GFR, Estimated >60 >60 mL/min  Comment: (NOTE) Calculated using the CKD-EPI Creatinine Equation (2021)    Anion gap 13 5 - 15    Comment: Performed at [facility], [place] [address] [place], [place] [address]  Magnesium     Status: None   Collection Time: 09/14/22  4:05 AM  Result Value Ref Range   Magnesium 1.8 1.7 - 2.4 mg/dL    Comment: Performed at [facility] [address] [place], [place] [address]   CT CERVICAL SPINE WO CONTRAST  Result Date: 09/14/2022 CLINICAL DATA:  Status post fall. EXAM: CT CERVICAL SPINE WITHOUT CONTRAST TECHNIQUE: Multidetector CT imaging of the cervical spine was performed without intravenous contrast. Multiplanar CT image reconstructions were also generated. RADIATION DOSE REDUCTION: This exam was performed according to the departmental dose-optimization program which includes automated exposure control, adjustment of the mA and/or kV according to patient size and/or use of iterative reconstruction technique. COMPARISON:  February 07, 2021 FINDINGS: Alignment: Normal. Skull base and vertebrae: No acute fracture. No primary bone  lesion or focal pathologic process. Soft tissues and spinal canal: No prevertebral fluid or swelling. No visible canal hematoma. Disc levels: Moderate to marked severity endplate sclerosis, mild anterior osteophyte formation and moderate severity posterior bony spurring are seen at the levels of C4-C5, C5-C6 and C6-C7. There is marked severity narrowing of the anterior atlantoaxial articulation. Moderate to marked severity intervertebral disc space narrowing is seen at C5-C6 and C6-C7, with mild intervertebral disc space narrowing seen throughout the remainder of the cervical spine. Bilateral marked severity multilevel facet joint hypertrophy is noted. Upper chest: Negative. Other: None. IMPRESSION: 1. No acute cervical spine fracture or subluxation. 2. Marked severity multilevel degenerative changes, as described above. Electronically Signed   By: Virgina Norfolk M.D.   On: 09/14/2022 01:12   CT HEAD WO CONTRAST (5MM)  Result Date: 09/14/2022 CLINICAL DATA:  Status post fall. EXAM: CT HEAD WITHOUT CONTRAST TECHNIQUE: Contiguous axial images were obtained from the base of the skull through the vertex without intravenous contrast. RADIATION DOSE REDUCTION: This exam was performed according to the departmental dose-optimization program which includes automated exposure control, adjustment of the mA and/or kV according to patient size and/or use of iterative reconstruction technique. COMPARISON:  None Available. FINDINGS: Brain: There is mild cerebral atrophy with widening of the extra-axial spaces and ventricular dilatation. There are areas of decreased attenuation within the white matter tracts of the supratentorial brain, consistent with microvascular disease changes. Vascular: There is marked severity bilateral cavernous carotid artery calcification. Skull: Normal. Negative for fracture or focal lesion. Sinuses/Orbits: There is mild sphenoid sinus and mild posterior right ethmoid sinus mucosal thickening.  Other: Mild left parietal scalp soft tissue swelling is seen. IMPRESSION: 1. Mild left parietal scalp soft tissue swelling without evidence for an acute fracture or acute intracranial abnormality. 2. Generalized cerebral atrophy with chronic white matter small vessel ischemic changes. 3. Mild sphenoid sinus and posterior right ethmoid sinus disease. Electronically Signed   By: Virgina Norfolk M.D.   On: 09/14/2022 01:10   DG Elbow Complete Left  Result Date: 09/14/2022 CLINICAL DATA:  Fall, altered mental status. EXAM: LEFT ELBOW - COMPLETE 3+ VIEW COMPARISON:  None Available. FINDINGS: There is no evidence of fracture, dislocation, or joint effusion. There is no evidence of arthropathy or other focal bone abnormality. IV is noted in the soft tissues of the midforearm. Soft tissues are unremarkable. IMPRESSION: No acute fracture or dislocation. Electronically Signed   By: Regan Rakers.D.  On: 09/14/2022 01:00   DG Chest Port 1 View  Result Date: 09/14/2022 CLINICAL DATA:  Possible sepsis.  Altered mental status. EXAM: PORTABLE CHEST 1 VIEW COMPARISON:  01/17/2021. FINDINGS: Heart is enlarged and the mediastinal contour is within normal limits. There is atherosclerotic calcifications of the aorta. Mild airspace disease is present at the right lung base. No effusion or pneumothorax. No acute osseous abnormality. IMPRESSION: 1. Mild airspace disease at the right lung base, possible atelectasis or infiltrate. 2. Cardiomegaly. Electronically Signed   By: Brett Fairy M.D.   On: 09/14/2022 00:59    Pending Labs Unresulted Labs (From admission, onward)     Start     Ordered   09/15/22 [id number]  Basic metabolic panel  Tomorrow morning,   R        09/14/22 0828   09/15/22 0500  CBC  Tomorrow morning,   R        09/14/22 0828   09/13/22 2345  Urine Culture  Once,   R        09/13/22 2345   09/13/22 2333  Blood Culture (routine x 2)  (Undifferentiated presentation (screening labs and basic nursing  orders))  BLOOD CULTURE X 2,   STAT      09/13/22 2334            Vitals/Pain Today's Vitals   09/14/22 1100 09/14/22 1152 09/14/22 1300 09/14/22 1500  BP: (!) 162/99 (!) 166/91 (!) 189/97 (!) 158/69  Pulse: 69 78 (!) 59 69  Resp: (!) 9 17 13 12  $ Temp:  (!) 97.4 F (36.3 C)    TempSrc:  Axillary    SpO2: 100% 99% 97% 90%    Isolation Precautions No active isolations  Medications Medications  acetaminophen (TYLENOL) tablet 650 mg (has no administration in time range)    Or  acetaminophen (TYLENOL) suppository 650 mg (has no administration in time range)  melatonin tablet 3 mg (has no administration in time range)  cefTRIAXone (ROCEPHIN) 1 g in sodium chloride 0.9 % 100 mL IVPB (has no administration in time range)  diltiazem (CARDIZEM) tablet 30 mg (has no administration in time range)  escitalopram (LEXAPRO) tablet 15 mg (15 mg Oral Given 09/14/22 1350)  pantoprazole (PROTONIX) EC tablet 20 mg (has no administration in time range)  finasteride (PROSCAR) tablet 5 mg (has no administration in time range)  brimonidine (ALPHAGAN) 0.2 % ophthalmic solution 1 drop (has no administration in time range)  dorzolamide (TRUSOPT) 2 % ophthalmic solution 1 drop (has no administration in time range)  ondansetron (ZOFRAN) tablet 4 mg (has no administration in time range)    Or  ondansetron (ZOFRAN) injection 4 mg (has no administration in time range)  albuterol (PROVENTIL) (2.5 MG/3ML) 0.083% nebulizer solution 2.5 mg (has no administration in time range)  potassium chloride 10 mEq in 100 mL IVPB (has no administration in time range)  polyethylene glycol (MIRALAX / GLYCOLAX) packet 17 g (17 g Oral Given 09/14/22 1350)  0.9 %  sodium chloride infusion (has no administration in time range)  hydrALAZINE (APRESOLINE) injection 5 mg (has no administration in time range)  magnesium sulfate IVPB 1 g 100 mL (1 g Intravenous New Bag/Given 09/14/22 1430)  lidocaine (XYLOCAINE) 2 % jelly 1  Application (1 Application Topical Given 09/14/22 0018)  cefTRIAXone (ROCEPHIN) 2 g in sodium chloride 0.9 % 100 mL IVPB (0 g Intravenous Stopped 09/14/22 0249)  azithromycin (ZITHROMAX) 500 mg in sodium chloride 0.9 % 250 mL IVPB (0 mg Intravenous  Stopped 09/14/22 0401)  lactated ringers bolus 500 mL (0 mLs Intravenous Stopped 09/14/22 0249)  potassium chloride (KLOR-CON) packet 40 mEq (40 mEq Oral Given 09/14/22 0920)    Mobility Unable to assess     Focused Assessments    R Recommendations: See Admitting Provider Note  Report given to:   Additional Notes: spanish speaking. Takes pills with applesauce well

## 2022-09-14 NOTE — H&P (Addendum)
History and Physical    DOA: 09/13/2022  PCP: Earmon Phoenix, NP  Patient coming from: [place]  Chief Complaint: Altered mental status, fall  HPI: Mark Moore is a 89 y.o. male with history h/o dementia, HTN, HLP, chronic atrial fibrillation, BPH, GERD, depression, multiple hospitalizations for recurrent UTI/metabolic encephalopathy sent from [facility] memory care ward and concern for altered mental status x 2 days.  He also had a fall yesterday although preceded by confusion.  Patient has had episodes of metabolic encephalopathy in the setting of recurrent UTIs in the past.  Patient is Spanish-speaking and has underlying dementia, appears lethargic/tired and did not want to communicate.  History obtained by speaking to daughter-Anna over the phone.  Daughter states she visits him almost daily.  He apparently is not very active at baseline mostly bedbound, gets out of bed to chair with assistance and walks minimally with a rollator at the SNF with staff assistance.  He is incontinent at baseline but does not have a chronic indwelling Foley.  According to daughter patient has been doing okay with no cough or shortness of breath or other complaints.  He was primarily sent here because of fall yesterday.  Patient has baseline dysphagia and is on pured diet at the facility.  He was previously placed on thickened liquids but was getting dehydrated, hence back on thin liquids.  She is asking if he could be sent back to the facility as soon as possible as he does poorly with hospitalizations.  She did confirm that patient is DNR and follows [facility]  ED course: Afebrile, Tmax 99.7 F, pulse 70-98, BP 145/81-186/88, O2 sat 92-100% on room air.  Labs with  sodium 142, potassium 3.1, bicarb 30, BUN 14, creatinine 0.7, calcium 8.8, glucose 98, lactate 2.3-2.2, WBC 12.1, hemoglobin 13.1, abnormal catheterized UA, CT head unremarkable, chest x-ray-atelectasis versus infiltrate at right lung base, left  elbow x-ray-WNL.  Patient started on empiric antibiotics Rocephin/azithromycin to cover UTI/CAP and requested to be admitted for further evaluation of these and associated encephalopathy.   Review of Systems: As per HPI, otherwise review of systems negative.    Past Medical History:  Diagnosis Date   Atrial fibrillation [facility])    Atrial fibrillation, chronic (HCC)    BPH (benign prostatic hyperplasia)    Dementia ([place])    Depression    DNR (do not resuscitate) 01/17/2021   Dyslipidemia    GERD (gastroesophageal reflux disease)    Glaucoma    Hypertension    Thoracic compression fracture, closed, initial encounter ([place]) 01/17/2021    Past Surgical History:  Procedure Laterality Date   NO PAST SURGERIES     none      Social history:  reports that he has never smoked. He has never used smokeless tobacco. He reports that he does not currently use alcohol. He reports that he does not currently use drugs.   Allergies  Allergen Reactions   Wellbutrin [Bupropion] Other (See Comments)    XL 150 mg- Made the patient feel paranoid and have borderline hallucinations   Family history: Nothing contributory given advanced age.    Prior to Admission medications   Medication Sig Start Date End Date Taking? Authorizing Provider  acetaminophen (TYLENOL) 500 MG tablet Take 1,000 mg by mouth 2 (two) times daily as needed for mild pain.    [provider]  amoxicillin-clavulanate (AUGMENTIN) 500-125 MG tablet Take 1 tablet by mouth See admin instructions. Bid x 7 days Patient not taking: Reported  on 08/09/2021    [provider]  atorvastatin (LIPITOR) 10 MG tablet Take 1 tablet (10 mg total) by mouth daily. 12/02/20   Colon Branch, MD  brimonidine (ALPHAGAN) 0.2 % ophthalmic solution Place 1 drop into both eyes 3 (three) times daily. 12/03/20   Colon Branch, MD  diltiazem (CARDIZEM) 30 MG tablet Take 1 tablet (30 mg total) by mouth in the morning and at bedtime. 12/02/20   Colon Branch,  MD  dorzolamide (TRUSOPT) 2 % ophthalmic solution Place 1 drop into both eyes 2 (two) times daily. 12/03/20   Colon Branch, MD  ELIQUIS 5 MG TABS tablet Take 5 mg by mouth 2 (two) times daily. 03/15/21   [provider]  escitalopram (LEXAPRO) 10 MG tablet Take 10 mg by mouth daily.    [provider]  famotidine (PEPCID) 20 MG tablet Take 1 tablet (20 mg total) by mouth 2 (two) times daily. Patient not taking: Reported on 08/09/2021 05/10/21   Orvis Brill, DO  famotidine (PEPCID) 40 MG tablet Take 1 tablet (40 mg total) by mouth every evening. Patient not taking: Reported on 08/09/2021 05/10/21 05/10/22  Orvis Brill, DO  finasteride (PROSCAR) 5 MG tablet Take 1 tablet (5 mg total) by mouth daily. 12/02/20   Colon Branch, MD  Lactobacillus (ACIDOPHILUS PROBIOTIC PO) Take 1 capsule by mouth in the morning and at bedtime.    [provider]  nitrofurantoin (MACRODANTIN) 50 MG capsule Take 50 mg by mouth daily. Continuously    [provider]  pantoprazole (PROTONIX) 20 MG tablet Take 20 mg by mouth daily.    [provider]  spironolactone (ALDACTONE) 25 MG tablet Take 25 mg by mouth daily.    [provider]  tamsulosin (FLOMAX) 0.4 MG CAPS capsule Take 0.4 mg by mouth daily.    [provider]  vitamin B-12 (CYANOCOBALAMIN) 1000 MCG tablet Take 1,000 mcg by mouth daily.    [provider]    Physical Exam: Vitals:   09/14/22 0630 09/14/22 0645 09/14/22 0700 09/14/22 0740  BP: (!) 150/92 (!) 145/81 (!) 159/100 (!) 159/86  Pulse: 70 70 77 86  Resp: 17 15 17 $ (!) 22  Temp:    98.9 F (37.2 C)  TempSrc:    Axillary  SpO2: 92% 99% 97% 100%    Constitutional: Patient appears cachectic, tired/lethargic, did not want to communicate but in no acute distress Eyes: PERRL, lids and conjunctivae normal ENMT: Mucous membranes are moist. Posterior pharynx clear of any exudate or lesions.Normal dentition.  Neck: normal, supple, no  masses, no thyromegaly Respiratory: clear to auscultation bilaterally, no wheezing, no crackles. Normal respiratory effort. No accessory muscle use.  Cardiovascular: Regular rate and rhythm, no murmurs / rubs / gallops. No extremity edema. 2+ pedal pulses. No carotid bruits.  Abdomen: no tenderness, no masses palpated. No hepatosplenomegaly. Bowel sounds positive.  Musculoskeletal: no clubbing / cyanosis. No joint deformity upper and lower extremities.  Neurologic: Confused at baseline, CN 2-12 grossly intact. Sensation intact, DTR normal. Strength 5/5 in all 4.  Psychiatric: Appears lethargic, confused at baseline  SKIN/catheters: no rashes, lesions, ulcers. No induration  Labs on Admission: I have personally reviewed following labs and imaging studies  CBC: Recent Labs  Lab 09/13/22 2310 09/14/22 0405  WBC 12.1* 11.2*  NEUTROABS 10.3* 9.0*  HGB 13.1 11.4*  HCT 38.6* 34.5*  MCV 95.5 98.3  PLT 227 [id number]   Basic Metabolic Panel: Recent Labs  Lab 09/13/22 2310  09/14/22 0405  NA 141 142  K 3.1* 3.2*  CL 102 99  CO2 27 30  GLUCOSE 100* 98  BUN 16 14  CREATININE 0.83 0.77  CALCIUM 9.4 8.8*  MG  --  1.8   GFR: CrCl cannot be calculated (Unknown ideal weight.). Recent Labs  Lab 09/13/22 2310 09/14/22 0010 09/14/22 0128 09/14/22 0405  WBC 12.1*  --   --  11.2*  LATICACIDVEN  --  2.2* 2.3*  --    Liver Function Tests: Recent Labs  Lab 09/13/22 2310 09/14/22 0405  AST 23 25  ALT 14 13  ALKPHOS 84 69  BILITOT 0.9 0.4  PROT 7.0 5.8*  ALBUMIN 3.7 3.1*   No results for input(s): "LIPASE", "AMYLASE" in the last 168 hours. No results for input(s): "AMMONIA" in the last 168 hours. Coagulation Profile: Recent Labs  Lab 09/13/22 2310  INR 1.1   Cardiac Enzymes: No results for input(s): "CKTOTAL", "CKMB", "CKMBINDEX", "TROPONINI" in the last 168 hours. BNP (last 3 results) No results for input(s): "PROBNP" in the last 8760 hours. HbA1C: No results for input(s):  "HGBA1C" in the last 72 hours. CBG: No results for input(s): "GLUCAP" in the last 168 hours. Lipid Profile: No results for input(s): "CHOL", "HDL", "LDLCALC", "TRIG", "CHOLHDL", "LDLDIRECT" in the last 72 hours. Thyroid Function Tests: No results for input(s): "TSH", "T4TOTAL", "FREET4", "T3FREE", "THYROIDAB" in the last 72 hours. Anemia Panel: No results for input(s): "VITAMINB12", "FOLATE", "FERRITIN", "TIBC", "IRON", "RETICCTPCT" in the last 72 hours. Urine analysis:    Component Value Date/Time   COLORURINE STRAW (A) 09/13/2022 2345   APPEARANCEUR CLEAR 09/13/2022 2345   LABSPEC 1.005 09/13/2022 2345   PHURINE 8.0 09/13/2022 2345   GLUCOSEU NEGATIVE 09/13/2022 2345   HGBUR NEGATIVE 09/13/2022 2345   BILIRUBINUR NEGATIVE 09/13/2022 [place] 09/13/2022 2345   PROTEINUR 30 (A) 09/13/2022 2345   NITRITE NEGATIVE 09/13/2022 2345   LEUKOCYTESUR MODERATE (A) 09/13/2022 2345    Radiological Exams on Admission: Personally reviewed  CT CERVICAL SPINE WO CONTRAST  Result Date: 09/14/2022 CLINICAL DATA:  Status post fall. EXAM: CT CERVICAL SPINE WITHOUT CONTRAST TECHNIQUE: Multidetector CT imaging of the cervical spine was performed without intravenous contrast. Multiplanar CT image reconstructions were also generated. RADIATION DOSE REDUCTION: This exam was performed according to the departmental dose-optimization program which includes automated exposure control, adjustment of the mA and/or kV according to patient size and/or use of iterative reconstruction technique. COMPARISON:  February 07, 2021 FINDINGS: Alignment: Normal. Skull base and vertebrae: No acute fracture. No primary bone lesion or focal pathologic process. Soft tissues and spinal canal: No prevertebral fluid or swelling. No visible canal hematoma. Disc levels: Moderate to marked severity endplate sclerosis, mild anterior osteophyte formation and moderate severity posterior bony spurring are seen at the levels of  C4-C5, C5-C6 and C6-C7. There is marked severity narrowing of the anterior atlantoaxial articulation. Moderate to marked severity intervertebral disc space narrowing is seen at C5-C6 and C6-C7, with mild intervertebral disc space narrowing seen throughout the remainder of the cervical spine. Bilateral marked severity multilevel facet joint hypertrophy is noted. Upper chest: Negative. Other: None. IMPRESSION: 1. No acute cervical spine fracture or subluxation. 2. Marked severity multilevel degenerative changes, as described above. Electronically Signed   By: Virgina Norfolk M.D.   On: 09/14/2022 01:12   CT HEAD WO CONTRAST (5MM)  Result Date: 09/14/2022 CLINICAL DATA:  Status post fall. EXAM: CT HEAD WITHOUT CONTRAST TECHNIQUE: Contiguous axial images were obtained from the  base of the skull through the vertex without intravenous contrast. RADIATION DOSE REDUCTION: This exam was performed according to the departmental dose-optimization program which includes automated exposure control, adjustment of the mA and/or kV according to patient size and/or use of iterative reconstruction technique. COMPARISON:  None Available. FINDINGS: Brain: There is mild cerebral atrophy with widening of the extra-axial spaces and ventricular dilatation. There are areas of decreased attenuation within the white matter tracts of the supratentorial brain, consistent with microvascular disease changes. Vascular: There is marked severity bilateral cavernous carotid artery calcification. Skull: Normal. Negative for fracture or focal lesion. Sinuses/Orbits: There is mild sphenoid sinus and mild posterior right ethmoid sinus mucosal thickening. Other: Mild left parietal scalp soft tissue swelling is seen. IMPRESSION: 1. Mild left parietal scalp soft tissue swelling without evidence for an acute fracture or acute intracranial abnormality. 2. Generalized cerebral atrophy with chronic white matter small vessel ischemic changes. 3. Mild  sphenoid sinus and posterior right ethmoid sinus disease. Electronically Signed   By: Virgina Norfolk M.D.   On: 09/14/2022 01:10   DG Elbow Complete Left  Result Date: 09/14/2022 CLINICAL DATA:  Fall, altered mental status. EXAM: LEFT ELBOW - COMPLETE 3+ VIEW COMPARISON:  None Available. FINDINGS: There is no evidence of fracture, dislocation, or joint effusion. There is no evidence of arthropathy or other focal bone abnormality. IV is noted in the soft tissues of the midforearm. Soft tissues are unremarkable. IMPRESSION: No acute fracture or dislocation. Electronically Signed   By: Brett Fairy M.D.   On: 09/14/2022 01:00   DG Chest Port 1 View  Result Date: 09/14/2022 CLINICAL DATA:  Possible sepsis.  Altered mental status. EXAM: PORTABLE CHEST 1 VIEW COMPARISON:  01/17/2021. FINDINGS: Heart is enlarged and the mediastinal contour is within normal limits. There is atherosclerotic calcifications of the aorta. Mild airspace disease is present at the right lung base. No effusion or pneumothorax. No acute osseous abnormality. IMPRESSION: 1. Mild airspace disease at the right lung base, possible atelectasis or infiltrate. 2. Cardiomegaly. Electronically Signed   By: Brett Fairy M.D.   On: 09/14/2022 00:59    EKG: Independently reviewed.  Atrial fibrillation with LAFB.  QTc 542 ms     Assessment and Plan:   Principal Problem:   Acute metabolic encephalopathy Active Problems:   Urinary tract infection without hematuria   Falls frequently   Hypertension   Depression   BPH (benign prostatic hyperplasia)   Hyperlipidemia   Atrial fibrillation, chronic (HCC)   Glaucoma   DNR (do not resuscitate)   ESBL (extended spectrum beta-lactamase) producing bacteria infection   Severe protein-calorie malnutrition ([place])    1.  Acute metabolic encephalopathy: Secondary to recurrent UTI versus delirium versus medication effect.  Versus dehydration.  Avoid sedatives.  Continue empiric antibiotics  with Rocephin and follow urine culture results.  No respiratory symptoms and chest x-ray suggestive of infiltrate versus atelectasis.  Will hold off on azithromycin for now.  Standard delirium precautions.  Resume Seroquel if worsening mental status (holding for QT interval).  Melatonin nightly as needed.  According to daughter patient gets really uncomfortable with Foley, will discontinue.  Patient has poor oral intake at baseline.  Will give ginger hydration for 24 hours.  2.  Abnormal UA with history of recurrent UTI: Afebrile but given elevated lactate and leukocytosis of 12 K, will treat empirically as discussed above.  Follow-up urine cultures as previous history of ESBL.  Patient completed Augmentin on 08/07/2021 and is on nitrofurantoin 50 mg  daily as prevention.  DC Foley catheter given propensity for infections and as also requested by daughter.  3.  Recurrent falls: In the setting of underlying dementia, problem #1 and problem #2.  Treat underlying causes.  PT/OT evaluation-check orthostatics especially in the setting of BP meds, finasteride/tamsulosin use.  4.  Hypokalemia: Replace and recheck labs in a.m.  5.  Chronic atrial fibrillation: Resume Eliquis and diltiazem.  Monitor heart rate, orthostatic vital signs.  May need to reconsider anticoagulation with history of recurrent falls and bleeding risk  6.  Hypertension, hyperlipidemia: Resume Lipitor and BP meds (diltiazem, Aldactone) and adjust if orthostatic.  As needed meds available  7.  Abnormal chest x-ray: Right lower lobe infiltrate/atelectasis reported on chest x-ray.  Patient does not have any respiratory complaints currently.  Will hold off azithromycin for now.  Speech requested to rule out aspiration.  Resume pured diet.  Incentive spirometry can be considered  if tolerates. Nebs PRN.  8.  Depression/dementia: Resume home med Lexapro but will hold Seroquel for now with prolonged QTc.  Delirium precautions  9. Glaucoma: Resume  home meds/eyedrops  10.  Prolonged QT interval: Current potassium level.  Keep magnesium close to 2.  Repeat EKG in AM.  Hold Seroquel for now.  On diltiazem  11.  BPH: Resume tamsulosin and finasteride.  Avoid Foley if possible.  Incontinent at baseline.  12.  Severe protein calorie malnutrition history: Patient now on hospice.  Does appear cachectic.  Resume pured diet and protein supplementation.     DVT prophylaxis: On anticoagulation/TED hose   Code Status: DNR as confirmed with daughter (per bedside records patient full code with hospice). Health care proxy would be his daughter Vicente Males.  Resume hospice, [facility] consulted  Patient/Family Communication: Discussed with patient and all questions answered to satisfaction.  Consults called: PT/OT Admission status :I certify that at the point of admission it is my clinical judgment that the patient will require inpatient hospital care spanning beyond 2 midnights from the point of admission due to high intensity of service and high frequency of surveillance required.Inpatient status is judged to be reasonable and necessary in order to provide the required intensity of service to ensure the patient's safety. The patient's presenting symptoms, physical exam findings, and initial radiographic and laboratory data in the context of their chronic comorbidities is felt to place them at high risk for further clinical deterioration. The following factors support the patient status of inpatient : Inpatient order already placed by overnight MD for UTI with associated metabolic encephalopathy needing IV antibiotics and anticipated treatment greater than 2 midnights.    Addendum 1.31 PM : Per updated medical reconciliation, patient only on Lexapro, Ativan and Seroquel at facility prior to transfer (possibly due to hospice at the facility).  Accordingly, will discontinue other meds like Aldactone, atorvastatin, vitamin supplements, Eliquis and tamsulosin.  Will  keep finasteride for now in case urinary retention predisposing to UTIs.  Will continue diltiazem as patient's blood pressure elevated and heart rate 80-90.  Will continue to hold Seroquel in concern for QT prolongation.  Daughter does want UTI to be treated.  Further treatment plan/medications can be titrated after discussion with daughter/[facility] care hospice in a.m. regarding long-term care goals.   Guilford Shi MD [facility] Pager in [place]  If 7PM-7AM, please contact night-coverage [email or web address]   09/14/2022, 9:30 AM

## 2022-09-14 NOTE — Progress Notes (Signed)
  Carryover admission to the Day Admitter.  I discussed this case with the EDP, Dr.  Betsey Holiday.  Per these discussions:   This is a 89 year old Spanish-speaking male with underlying dementia, who presents from SNF on their memory care ward, for evaluation of 1 to 2 days of confusion relative to baseline mental status, with witnessed ground-level fall without loss of consciousness on 09/13/2022.  Of note, SNF staff conveyed that confusion was apparent prior to the patient falling.  He reportedly has a history of multiple prior hospitalizations for acute metabolic encephalopathy in the setting of recurrent urinary tract infections.   Tubing evaluation notable for urinalysis that was reported to be consistent with UTI.  Additionally, imaging of the chest suggestive of infiltrate.  CT head and neck reportedly showed no evidence of acute process.  Ultrasound serum collected today and the patient was started on Rocephin and azithromycin for urinary tract infection as well as community-acquired pneumonia coverage.   I have placed an order for inpatient admission to med/tele for further evaluation management of acute metabolic encephalopathy in the setting of urinary tract infection as well as suspected community-acquired pneumonia.   I have placed some additional preliminary admit orders via the adult multi-morbid admission order set. I have also ordered morning labs in the form of CMP, CBC with differential and serum magnesium level.  Have also ordered additional azithromycin and Rocephin.  Code status has been ordered as DNR on the basis of CODE STATUS documentation from previous hospitalizations.    Babs Bertin, DO Hospitalist

## 2022-09-15 DIAGNOSIS — G9341 Metabolic encephalopathy: Secondary | ICD-10-CM | POA: Diagnosis not present

## 2022-09-15 LAB — BASIC METABOLIC PANEL
Anion gap: 9 (ref 5–15)
BUN: 12 mg/dL (ref 8–23)
CO2: 29 mmol/L (ref 22–32)
Calcium: 8.4 mg/dL — ABNORMAL LOW (ref 8.9–10.3)
Chloride: 103 mmol/L (ref 98–111)
Creatinine, Ser: 0.68 mg/dL (ref 0.61–1.24)
GFR, Estimated: >60 mL/min (ref >60)
Glucose, Bld: 86 mg/dL (ref 70–99)
Potassium: 2.5 mmol/L — CL (ref 3.5–5.1)
Sodium: 141 mmol/L (ref 135–145)

## 2022-09-15 LAB — CBC
HCT: 36.2 % — ABNORMAL LOW (ref 39.0–52.0)
Hemoglobin: 11.8 g/dL — ABNORMAL LOW (ref 13.0–17.0)
MCH: 31.7 pg (ref 26.0–34.0)
MCHC: 32.6 g/dL (ref 30.0–36.0)
MCV: 97.3 fL (ref 80.0–100.0)
Platelets: 216 10*3/uL (ref 150–400)
RBC: 3.72 MIL/uL — ABNORMAL LOW (ref 4.22–5.81)
RDW: 15.1 % (ref 11.5–15.5)
WBC: 6.6 10*3/uL (ref 4.0–10.5)
nRBC: 0 % (ref 0.0–0.2)

## 2022-09-15 LAB — MAGNESIUM: Magnesium: 1.9 mg/dL (ref 1.7–2.4)

## 2022-09-15 LAB — POTASSIUM: Potassium: 3.5 mmol/L (ref 3.5–5.1)

## 2022-09-15 MED ORDER — AZITHROMYCIN 250 MG PO TABS: 500.0000 mg | ORAL_TABLET | Freq: Every day | ORAL | Status: DC

## 2022-09-15 MED ORDER — POTASSIUM CHLORIDE 10 MEQ/100ML IV SOLN
10.0000 meq | INTRAVENOUS | Status: AC
  Administered 2022-09-15 (×4): 10 meq via INTRAVENOUS
  Filled 2022-09-15 (×4): qty 100

## 2022-09-15 MED ORDER — QUETIAPINE FUMARATE 50 MG PO TABS
25.0000 mg | ORAL_TABLET | Freq: Every day | ORAL | Status: DC
  Administered 2022-09-15: 25 mg via ORAL
  Filled 2022-09-15: qty 1

## 2022-09-15 MED ORDER — AZITHROMYCIN 250 MG PO TABS
500.0000 mg | ORAL_TABLET | Freq: Every day | ORAL | Status: AC
  Administered 2022-09-15 – 2022-09-16 (×2): 500 mg via ORAL
  Filled 2022-09-15 (×2): qty 2

## 2022-09-15 MED ORDER — POTASSIUM CHLORIDE 20 MEQ PO PACK
60.0000 meq | PACK | Freq: Once | ORAL | Status: AC
  Administered 2022-09-15: 60 meq via ORAL
  Filled 2022-09-15: qty 3

## 2022-09-15 NOTE — Plan of Care (Signed)
?  Problem: Clinical Measurements: ?Goal: Will remain free from infection ?Outcome: Progressing ?  ?

## 2022-09-15 NOTE — Progress Notes (Signed)
PROGRESS NOTE    Mark Moore  [id number] DOB: 1928/08/07 DOA: 09/13/2022 PCP: Earmon Phoenix, NP    Brief Narrative:   Mark Moore is a 89 y.o. male with past medical history significant for dementia, HTN, hyperlipidemia, chronic atrial fibrillation, BPH, GERD, depression, multiple hospitalizations for recurrent UTI/metabolic encephalopathy who presented to [facility] ED on 2/13 from SNF memory care unit for concern for altered mental status over the previous 2 days.  SNF staff reported that patient with fall day prior to admission and concern for his confusion related to recurrent UTIs as this has been the case in the past.  Daughter reports he is not very active at baseline, mostly bedbound but gets out of the bed to a chair with assistance and walks minimally with a rollator at SNF with staff assistance.  Patient also with baseline dysphagia and on pured diet at the facility.  Patient currently on hospice and follows with [facility].  In the ED, temperature 98.9 F, HR 88, RR 25, BP 166/98, SpO2 98% on room air.  Sodium 141, potassium 3.1, chloride 102, CO2 27, glucose 100, BUN 16, creatinine 0.83.  AST 23, ALT 14, total bilirubin 0.9.  WBC 12.1, hemoglobin 13.1, platelets 227.  Lactic acid 2.2.  Urinalysis with moderate leukocytes, negative nitrite, few bacteria, greater than 50 WBCs.  CT head/C-spine without contrast with mid left parietal scalp soft tissue swelling without evidence of an acute fracture or acute normality, generalized cerebral atrophy with chronic white matter small vessel ischemic disease, no acute cervical spine fracture or subluxation, marked severity multilevel degenerative changes.  Chest x-ray with mild airspace disease at the right lung base, possible atelectasis versus infiltrate.  Left elbow x-ray with no acute findings.  Blood cultures x 2 and urine culture obtained.  Started on azithromycin and ceftriaxone.  TRH, consulted for admission for further  evaluation and management of acute metabolic encephalopathy likely secondary to pneumonia versus UTI.  Assessment & Plan:   Acute metabolic encephalopathy Patient presenting from SNF with worsening confusion more than his typical baseline.  History of recurrent UTIs but urine culture with insignificant growth.  Possible underlying commune acquired pneumonia as contributing factor as well as dehydration/lactic acidosis and electrolyte disturbance with hypokalemia.  Appears mental status has improved and likely close to his baseline. -- Continue treatment as below -- Supportive care  Community-acquired pneumonia Patient is afebrile without leukocytosis.  Oxygenating well on room air.  Chest x-ray with concern for right base infiltrate. -- Azithromycin 500 PO PO daily x 5 days -- ceftriaxone 1g IV q24h  UTI, ruled out Patient with history of recurrent UTIs causing metabolic encephalopathy.  Urinalysis with positive leukocytes, negative nitrite, few bacteria and greater than 50 WBCs.  Urine culture with insignificant growth.  Hypokalemia Potassium 2.5 this morning, will continue repletion.  Magnesium 1.9. -- Repeat potassium level this afternoon -- BMP in a.m.  Dementia --Delirium precautions --Get up during the day --Encourage a familiar face to remain present throughout the day --Keep blinds open and lights on during daylight hours --Minimize the use of opioids/benzodiazepines --Seroquel 12m PO qHS --On hospice at facility  Depression/anxiety -- Lexapro 15 mg p.o. daily  BPH -- Finasteride 5 mg p.o. daily  Hx essential hypertension Chronic atrial fibrillation --Continue Cardizem 30 mg p.o. every 12 hours --No longer on anticoagulation  Hyperlipidemia -- No longer on statin outpatient  GERD -- Protonix 20 mg p.o. daily    DVT prophylaxis: Place TED hose Start: 09/14/22 0827 SCDs  Start: 09/14/22 0400    Code Status: DNR Family Communication: Attempted to update  patient's daughter via telephone this afternoon, went straight to voicemail.  Unsuccessful.  Disposition Plan:  Level of care: Med-Surg Status is: Inpatient Remains inpatient appropriate because: Electrolyte replacement, IV antibiotics, anticipate discharge back to SNF under hospice care in 1-2 days    Consultants:  None  Procedures:  None  Antimicrobials:  Azithromycin 2/13, 2/15>> Ceftriaxone 2/13>>   Subjective: Patient seen examined bedside, resting comfortably.  Lying in bed.  Pleasantly confused.  Attempted interpretation by video interpreter Effie Shy [phone].  Patient unable to participate due to his underlying dementia.  Alert.  No family present.  Appears comfortable.  No acute concerns overnight per nursing staff.  Objective: Vitals:   09/14/22 1621 09/14/22 2205 09/15/22 0558 09/15/22 0822  BP: (!) 143/80 (!) 157/79 (!) 179/89 (!) 153/87  Pulse: 67 67 (!) 58 71  Resp: 16 16 (!) 22 17  Temp: 97.7 F (36.5 C)  (!) 97.5 F (36.4 C)   TempSrc: Axillary  Oral   SpO2: 94% 97% 98% 100%  Weight:   54.6 kg     Intake/Output Summary (Last 24 hours) at 09/15/2022 1214 Last data filed at 09/15/2022 0610 Gross per 24 hour  Intake 1203.81 ml  Output 2230 ml  Net -1026.19 ml   Filed Weights   09/15/22 0558  Weight: 54.6 kg    Examination:  Physical Exam: GEN: NAD, alert, pleasantly confused, chronically ill/elderly in appearance HEENT: NCAT, PERRL, EOMI, sclera clear, MMM PULM: CTAB w/o wheezes/crackles, normal respiratory effort, on room air CV: RRR w/o M/G/R GI: abd soft, NTND, NABS, no R/G/M MSK: no peripheral edema, moves all extremities independently NEURO: No focal deficits Integumentary: Abrasion noted to left temporal region, otherwise no other concerning rashes/lesions/wounds on exposed skin surfaces.    Data Reviewed: I have personally reviewed following labs and imaging studies  CBC: Recent Labs  Lab 09/13/22 2310 09/14/22 0405 09/15/22 0408   WBC 12.1* 11.2* 6.6  NEUTROABS 10.3* 9.0*  --   HGB 13.1 11.4* 11.8*  HCT 38.6* 34.5* 36.2*  MCV 95.5 98.3 97.3  PLT 227 206 [id number]   Basic Metabolic Panel: Recent Labs  Lab 09/13/22 2310 09/14/22 0405 09/15/22 0408  NA 141 142 141  K 3.1* 3.2* 2.5*  CL 102 99 103  CO2 27 30 29  $ GLUCOSE 100* 98 86  BUN 16 14 12  $ CREATININE 0.83 0.77 0.68  CALCIUM 9.4 8.8* 8.4*  MG  --  1.8 1.9   GFR: CrCl cannot be calculated (Unknown ideal weight.). Liver Function Tests: Recent Labs  Lab 09/13/22 2310 09/14/22 0405  AST 23 25  ALT 14 13  ALKPHOS 84 69  BILITOT 0.9 0.4  PROT 7.0 5.8*  ALBUMIN 3.7 3.1*   No results for input(s): "LIPASE", "AMYLASE" in the last 168 hours. No results for input(s): "AMMONIA" in the last 168 hours. Coagulation Profile: Recent Labs  Lab 09/13/22 2310  INR 1.1   Cardiac Enzymes: No results for input(s): "CKTOTAL", "CKMB", "CKMBINDEX", "TROPONINI" in the last 168 hours. BNP (last 3 results) No results for input(s): "PROBNP" in the last 8760 hours. HbA1C: No results for input(s): "HGBA1C" in the last 72 hours. CBG: No results for input(s): "GLUCAP" in the last 168 hours. Lipid Profile: No results for input(s): "CHOL", "HDL", "LDLCALC", "TRIG", "CHOLHDL", "LDLDIRECT" in the last 72 hours. Thyroid Function Tests: No results for input(s): "TSH", "T4TOTAL", "FREET4", "T3FREE", "THYROIDAB" in the last 72 hours. Anemia  Panel: No results for input(s): "VITAMINB12", "FOLATE", "FERRITIN", "TIBC", "IRON", "RETICCTPCT" in the last 72 hours. Sepsis Labs: Recent Labs  Lab 09/14/22 0010 09/14/22 0128  LATICACIDVEN 2.2* 2.3*    Recent Results (from the past 240 hour(s))  Blood Culture (routine x 2)     Status: None (Preliminary result)   Collection Time: 09/13/22 11:33 PM   Specimen: BLOOD  Result Value Ref Range Status   Specimen Description BLOOD BLOOD LEFT WRIST  Final   Special Requests   Final    BOTTLES DRAWN AEROBIC AND ANAEROBIC Blood Culture  adequate volume   Culture   Final    NO GROWTH 1 DAY Performed at [facility], [place] [address], [place], [place] [address]    Report Status PENDING  Incomplete  Blood Culture (routine x 2)     Status: None (Preliminary result)   Collection Time: 09/13/22 11:38 PM   Specimen: BLOOD  Result Value Ref Range Status   Specimen Description BLOOD RIGHT ANTECUBITAL  Final   Special Requests   Final    BOTTLES DRAWN AEROBIC AND ANAEROBIC Blood Culture adequate volume   Culture   Final    NO GROWTH 1 DAY Performed at [facility], [place] [address], [place], [place] [address]    Report Status PENDING  Incomplete  Urine Culture     Status: Abnormal   Collection Time: 09/13/22 11:45 PM   Specimen: Urine, Random  Result Value Ref Range Status   Specimen Description URINE, RANDOM  Final   Special Requests NONE Reflexed from [id number]  Final   Culture (A)  Final    <10,000 COLONIES/mL INSIGNIFICANT GROWTH Performed at [facility], [place] [address] [place], [place] [address]    Report Status 09/14/2022 FINAL  Final         Radiology Studies: CT CERVICAL SPINE WO CONTRAST  Result Date: 09/14/2022 CLINICAL DATA:  Status post fall. EXAM: CT CERVICAL SPINE WITHOUT CONTRAST TECHNIQUE: Multidetector CT imaging of the cervical spine was performed without intravenous contrast. Multiplanar CT image reconstructions were also generated. RADIATION DOSE REDUCTION: This exam was performed according to the departmental dose-optimization program which includes automated exposure control, adjustment of the mA and/or kV according to patient size and/or use of iterative reconstruction technique. COMPARISON:  February 07, 2021 FINDINGS: Alignment: Normal. Skull base and vertebrae: No acute fracture. No primary bone lesion or focal pathologic process. Soft tissues and spinal canal: No prevertebral fluid or swelling. No visible canal hematoma. Disc levels: Moderate to marked severity endplate sclerosis, mild  anterior osteophyte formation and moderate severity posterior bony spurring are seen at the levels of C4-C5, C5-C6 and C6-C7. There is marked severity narrowing of the anterior atlantoaxial articulation. Moderate to marked severity intervertebral disc space narrowing is seen at C5-C6 and C6-C7, with mild intervertebral disc space narrowing seen throughout the remainder of the cervical spine. Bilateral marked severity multilevel facet joint hypertrophy is noted. Upper chest: Negative. Other: None. IMPRESSION: 1. No acute cervical spine fracture or subluxation. 2. Marked severity multilevel degenerative changes, as described above. Electronically Signed   By: Virgina Norfolk M.D.   On: 09/14/2022 01:12   CT HEAD WO CONTRAST (5MM)  Result Date: 09/14/2022 CLINICAL DATA:  Status post fall. EXAM: CT HEAD WITHOUT CONTRAST TECHNIQUE: Contiguous axial images were obtained from the base of the skull through the vertex without intravenous contrast. RADIATION DOSE REDUCTION: This exam was performed according to the departmental dose-optimization program which includes automated exposure  control, adjustment of the mA and/or kV according to patient size and/or use of iterative reconstruction technique. COMPARISON:  None Available. FINDINGS: Brain: There is mild cerebral atrophy with widening of the extra-axial spaces and ventricular dilatation. There are areas of decreased attenuation within the white matter tracts of the supratentorial brain, consistent with microvascular disease changes. Vascular: There is marked severity bilateral cavernous carotid artery calcification. Skull: Normal. Negative for fracture or focal lesion. Sinuses/Orbits: There is mild sphenoid sinus and mild posterior right ethmoid sinus mucosal thickening. Other: Mild left parietal scalp soft tissue swelling is seen. IMPRESSION: 1. Mild left parietal scalp soft tissue swelling without evidence for an acute fracture or acute intracranial abnormality.  2. Generalized cerebral atrophy with chronic white matter small vessel ischemic changes. 3. Mild sphenoid sinus and posterior right ethmoid sinus disease. Electronically Signed   By: Virgina Norfolk M.D.   On: 09/14/2022 01:10   DG Elbow Complete Left  Result Date: 09/14/2022 CLINICAL DATA:  Fall, altered mental status. EXAM: LEFT ELBOW - COMPLETE 3+ VIEW COMPARISON:  None Available. FINDINGS: There is no evidence of fracture, dislocation, or joint effusion. There is no evidence of arthropathy or other focal bone abnormality. IV is noted in the soft tissues of the midforearm. Soft tissues are unremarkable. IMPRESSION: No acute fracture or dislocation. Electronically Signed   By: Brett Fairy M.D.   On: 09/14/2022 01:00   DG Chest Port 1 View  Result Date: 09/14/2022 CLINICAL DATA:  Possible sepsis.  Altered mental status. EXAM: PORTABLE CHEST 1 VIEW COMPARISON:  01/17/2021. FINDINGS: Heart is enlarged and the mediastinal contour is within normal limits. There is atherosclerotic calcifications of the aorta. Mild airspace disease is present at the right lung base. No effusion or pneumothorax. No acute osseous abnormality. IMPRESSION: 1. Mild airspace disease at the right lung base, possible atelectasis or infiltrate. 2. Cardiomegaly. Electronically Signed   By: Brett Fairy M.D.   On: 09/14/2022 00:59        Scheduled Meds:  azithromycin  500 mg Oral Daily   brimonidine  1 drop Both Eyes TID   diltiazem  30 mg Oral Q12H   dorzolamide  1 drop Both Eyes BID   escitalopram  15 mg Oral Daily   finasteride  5 mg Oral Daily   pantoprazole  20 mg Oral Daily   polyethylene glycol  17 g Oral Daily   Continuous Infusions:  cefTRIAXone (ROCEPHIN)  IV 1 g (09/14/22 2145)     LOS: 1 day    Time spent: 52 minutes spent on chart review, discussion with nursing staff, consultants, updating family and interview/physical exam; more than 50% of that time was spent in counseling and/or coordination of  care.    Amir Fick J [place] DO [facility] Available via Epic secure chat 7am-7pm After these hours, please refer to coverage provider listed on [email or web address] 09/15/2022, 12:14 PM

## 2022-09-15 NOTE — Evaluation (Signed)
Occupational Therapy Evaluation Patient Details Name: Mark Moore MRN: [id number] DOB: July 01, 1929 Today's Date: 09/15/2022   History of Present Illness Pt is a 89 y/o male who presents s/p fall at his memory care facility. He was admitted with acute metabolic encephalopathy, CAP. PMH significant for recurrent UTI's,  dementia, a-fib, BPH, depression, glaucoma, HTN, thoracic compression fracture (2022).   Clinical Impression   Pt admitted with problem above with deficits listed below. Upon eval, pt performing bed mobility with min A and transfers with max A +2. Utilizing interpreter during session and pt with some meaningful responses as well as some tangential thoughts per interpreter. Pt with greater response when OT repeating interpreter, so suspect difficulty hearing interpreter despite volume turned up to max. Pt required mod-max cues to take small bites and sips when self feeding for safety. Recommending return to [facility]. Will continue to follow acutely to optimize safety and independence with ADL and mobility.      Recommendations for follow up therapy are one component of a multi-disciplinary discharge planning process, led by the attending physician.  Recommendations may be updated based on patient status, additional functional criteria and insurance authorization.   Follow Up Recommendations  Skilled nursing-short term rehab (<3 hours/day)     Assistance Recommended at Discharge Frequent or constant Supervision/Assistance  Patient can return home with the following Two people to help with walking and/or transfers;Two people to help with bathing/dressing/bathroom;Assistance with cooking/housework;Assistance with feeding;Direct supervision/assist for medications management;Direct supervision/assist for financial management;Assist for transportation;Help with stairs or ramp for entrance    Functional Status Assessment  Patient has had a recent decline in their  functional status and demonstrates the ability to make significant improvements in function in a reasonable and predictable amount of time.  Equipment Recommendations  Other (comment) (defer)    Recommendations for Other Services Speech consult     Precautions / Restrictions Precautions Precautions: Fall Precaution Comments: Does not do well with video interpreter. May do better with in-person. Restrictions Weight Bearing Restrictions: No      Mobility Bed Mobility Overal bed mobility: Needs Assistance Bed Mobility: Supine to Sit, Sit to Supine     Supine to sit: Mod assist, +2 for physical assistance, HOB elevated Sit to supine: Min assist, +2 for physical assistance   General bed mobility comments: Facilitating transition to/from EOB with HHA and guiding trunk as pt not able to understand/attend to the interpreter for instruction.    Transfers Overall transfer level: Needs assistance Equipment used: 2 person hand held assist Transfers: Sit to/from Stand Sit to Stand: Max assist, +2 physical assistance           General transfer comment: HHA provided for balance. Power up to stand was weak and +2 assist provided at gait belt and at UE's for support. Pt able to hold standing balance ~10" before requiring seated rest break. Heavy posterior lean noted.      Balance Overall balance assessment: Needs assistance   Sitting balance-Leahy Scale: Poor Sitting balance - Comments: Poor initially. After ~2 minutes able to sit unsupported to eat at EOB.   Standing balance support: Bilateral upper extremity supported, During functional activity Standing balance-Leahy Scale: Zero                             ADL either performed or assessed with clinical judgement   ADL Overall ADL's : Needs assistance/impaired Eating/Feeding: Set up;Sitting Eating/Feeding Details (indicate cue type and  reason): d1 diet; food tray in room. Max multimodal cues to take small bites.  Observed with min gurgling sounds. Grooming: Minimal assistance;Sitting   Upper Body Bathing: Moderate assistance;Sitting   Lower Body Bathing: Maximal assistance;+2 for physical assistance;+2 for safety/equipment;Sit to/from stand   Upper Body Dressing : Moderate assistance   Lower Body Dressing: Maximal assistance;+2 for physical assistance;+2 for safety/equipment   Toilet Transfer: Maximal assistance;+2 for physical assistance;+2 for safety/equipment Toilet Transfer Details (indicate cue type and reason): STS at this time         Functional mobility during ADLs: Maximal assistance;+2 for physical assistance;+2 for safety/equipment (for STS) General ADL Comments: limited by language barrier `     Vision Patient Visual Report: Other (comment) (Pt not reporting) Vision Assessment?: No apparent visual deficits Additional Comments: no overshooting or undershooting noted during session.     Perception Perception Perception Tested?: No   Praxis Praxis Praxis tested?: Not tested    Pertinent Vitals/Pain Pain Assessment Pain Assessment: Faces Faces Pain Scale: No hurt Pain Intervention(s): Limited activity within patient's tolerance, Monitored during session     Hand Dominance Right   Extremity/Trunk Assessment Upper Extremity Assessment Upper Extremity Assessment: Generalized weakness   Lower Extremity Assessment Lower Extremity Assessment: Generalized weakness   Cervical / Trunk Assessment Cervical / Trunk Assessment: Normal   Communication Communication Communication: Interpreter utilized   Cognition Arousal/Alertness: Awake/alert Behavior During Therapy: Restless Overall Cognitive Status: Difficult to assess                                 General Comments: Difficult to utilize interpreter. Pt occasionally with appropriate questions/statements regarding how long will he be here, not preferring food he was brought for lunch, etc. Not answering  questions appropriately via interpreter. Interpreter reports tangential speech at times.     General Comments  VSS. Pt pleasant and attempts to follow commands.    Exercises     Shoulder Instructions      Home Living Family/patient expects to be discharged to:: Skilled nursing facility                                 Additional Comments: Memory care unit at [facility]      Prior Functioning/Environment Prior Level of Function : Needs assist             Mobility Comments: Per chart review: He apparently is not very active at baseline mostly bedbound, gets out of bed to chair with assistance and walks minimally with a rollator at the SNF with staff assistance. ADLs Comments: per chart review, staff assists at memory care        OT Problem List: Decreased strength;Decreased activity tolerance;Impaired balance (sitting and/or standing);Decreased cognition;Decreased safety awareness;Decreased knowledge of use of DME or AE      OT Treatment/Interventions: Self-care/ADL training;Therapeutic exercise;DME and/or AE instruction;Balance training;Patient/family education;Therapeutic activities;Cognitive remediation/compensation    OT Goals(Current goals can be found in the care plan section) Acute Rehab OT Goals Patient Stated Goal: none stated OT Goal Formulation: With patient Time For Goal Achievement: 09/29/22 Potential to Achieve Goals: Good  OT Frequency: Min 2X/week    Co-evaluation   Reason for Co-Treatment: Complexity of the patient's impairments (multi-system involvement);Necessary to address cognition/behavior during functional activity;For patient/therapist safety;To address functional/ADL transfers PT goals addressed during session: Mobility/safety with mobility;Balance;Strengthening/ROM  AM-PAC OT "6 Clicks" Daily Activity     Outcome Measure Help from another person eating meals?: A Little Help from another person taking care of personal  grooming?: A Lot Help from another person toileting, which includes using toliet, bedpan, or urinal?: Total Help from another person bathing (including washing, rinsing, drying)?: A Lot Help from another person to put on and taking off regular upper body clothing?: A Lot Help from another person to put on and taking off regular lower body clothing?: A Lot 6 Click Score: 12   End of Session Equipment Utilized During Treatment: Gait belt Nurse Communication: Mobility status;Other (comment) (potentially beginning to have bowel movement/fowl smell)  Activity Tolerance: Patient tolerated treatment well Patient left: in bed;with call bell/phone within reach;with bed alarm set  OT Visit Diagnosis: Unsteadiness on feet (R26.81);Muscle weakness (generalized) (M62.81);Other abnormalities of gait and mobility (R26.89)                Time: [id number] OT Time Calculation (min): 29 min Charges:  OT General Charges $OT Visit: 1 Visit OT Evaluation $OT Eval Moderate Complexity: 1 Mod  Elder Cyphers, OTR/L [facility] Acute Rehabilitation Office: [phone]   Magnus Ivan 09/15/2022, 5:15 PM

## 2022-09-15 NOTE — Progress Notes (Signed)
[facility]) Hospital Liaison Note    Mr. Mark Moore is a current [facility] patient with a terminal diagnosis of Severe protein calorie malnutrition with abnormal weight loss. Patient had fallen and was found in the floor at his facility with a laceration present to the left side of his head. [facility] was made aware. Patient was transported to the [facility] ED from [place] in the evening on 2.13. CT of head showed no injuries, but white blood cells were slightly elevated and Chest x-ray showed possible pneumonia. He was also found to have a UTI with ESBL.  Mr. Mark Moore was admitted in the morning on 2.14 with a diagnosis of acute metabolic encephalopathy. Per ACC Dr. Karie Georges, this is a related hospital admission.    Visited Mr. Mark Moore at the bedside. Daughter was present at bedside and could translate. Patient was alert and conversant today. Report exchanged with RN. Has been switched to PO antibiotics. Will most likely DC back to his facility tomorrow.    Mr. Mark Moore is inpatient appropriate due to requiring IV medications for treatment of acute Metabolic encephalopathy.    V/S: 153/87 bp, 97.5 Temp, 71 bpm, 17 RR, 100% on RA   I/O: 1203.03/2229   Labs:  Potassium: 2.5 (LL) Calcium: 8.4 (L) RBC: 3.72 (L) Hemoglobin: 11.8 (L) HCT: 36.2 (L)   Diagnostics:  None today  IV/PRN:    potassium chloride 10 mEq in 100 mL IVPB Dose: 10 mEq Freq: Every 1 hr x 4 Route: IV x4  hydrALAZINE (APRESOLINE) injection 5 mg Dose: 5 mg Freq: Every 6 hours PRN Route: IV PRN Reason: high blood pressure x1   Problem list: Assessment & Plan:   Acute metabolic encephalopathy Patient presenting from SNF with worsening confusion more than his typical baseline.  History of recurrent UTIs but urine culture with insignificant growth.  Possible underlying commune acquired pneumonia as contributing factor as well as dehydration/lactic acidosis and electrolyte disturbance with hypokalemia.  Appears  mental status has improved and likely close to his baseline. -- Continue treatment as below -- Supportive care   Community-acquired pneumonia Patient is afebrile without leukocytosis.  Oxygenating well on room air.  Chest x-ray with concern for right base infiltrate. -- Azithromycin 500 PO PO daily x 5 days -- ceftriaxone 1g IV q24h   UTI, ruled out Patient with history of recurrent UTIs causing metabolic encephalopathy.  Urinalysis with positive leukocytes, negative nitrite, few bacteria and greater than 50 WBCs.  Urine culture with insignificant growth.   Hypokalemia Potassium 2.5 this morning, will continue repletion.  Magnesium 1.9. -- Repeat potassium level this afternoon -- BMP in a.m.   Dementia --Delirium precautions --Get up during the day --Encourage a familiar face to remain present throughout the day --Keep blinds open and lights on during daylight hours --Minimize the use of opioids/benzodiazepines --Seroquel 15m PO qHS --On hospice at facility   Depression/anxiety -- Lexapro 15 mg p.o. daily   BPH -- Finasteride 5 mg p.o. daily   Hx essential hypertension Chronic atrial fibrillation --Continue Cardizem 30 mg p.o. every 12 hours --No longer on anticoagulation   Hyperlipidemia -- No longer on statin outpatient   GERD -- Protonix 20 mg p.o. daily  GOC: Patient is a DNR.     D/C planning: Ongoing.     Family: Daughter present at bedside.     IDT: Updated   Should patient need ambulance transfer - Please use [facility] as they contract this service for our active hospice patients.  Zigmund Gottron, RN [facility] Liaison  [phone]

## 2022-09-15 NOTE — TOC Initial Note (Signed)
Transition of Care [facility]) - Initial/Assessment Note    Patient Details  Name: Mark Moore MRN: [id number] Date of Birth: [birth year]  [facility] CM/SW Contact:    Coralee Pesa, [place] Phone Number: 09/15/2022, 1:35 PM  Clinical Narrative:                 CSW noted pt is disoriented and spoke with dtr. Dtr confirms pt is from [facility] with [place] for hospice services. Dtr is agreeable to him returning, and states he is more comfortable there. CSW confirmed fax # w/ [facility] [phone]) main # as contact. FL2 started to be completed at DC. Pt will need PTAR transport. TOC will continue to follow for DC needs.   Expected Discharge Plan: Assisted Living Barriers to Discharge: Continued Medical Work up   Patient Goals and CMS Choice Patient states their goals for this hospitalization and ongoing recovery are:: Pt disoriented and unable to participate in goal setting. CMS Medicare.gov Compare Post Acute Care list provided to:: Patient Represenative (must comment) Choice offered to / list presented to : Adult Children      Expected Discharge Plan and Services In-house Referral: Clinical Social Work   Post Acute Care Choice: Resumption of Svcs/PTA Provider Living arrangements for the past 2 months: [place]                                      Prior Living Arrangements/Services Living arrangements for the past 2 months: [place] Lives with:: Facility Resident Patient language and need for interpreter reviewed:: Yes Do you feel safe going back to the place where you live?: Yes      Need for Family Participation in Patient Care: Yes (Comment) Care giver support system in place?: Yes (comment) Current home services: Hospice Criminal Activity/Legal Involvement Pertinent to Current Situation/Hospitalization: No - Comment as needed  Activities of Daily Living      Permission Sought/Granted Permission sought  to share information with : Family Supports, Chartered certified accountant granted to share information with : Yes, Verbal Permission Granted  Share Information with NAME: Wilhemena Durie  Permission granted to share info w AGENCY: [place] granted to share info w Relationship: Dtr     Emotional Assessment Appearance:: Appears stated age Attitude/Demeanor/Rapport: Unable to Assess Affect (typically observed): Unable to Assess Orientation: : Oriented to Self Alcohol / Substance Use: Not Applicable Psych Involvement: No (comment)  Admission diagnosis:  Minor head injury, initial encounter [S09.90XA] Urinary tract infection without hematuria, site unspecified [N39.0] Pneumonia of right lower lobe due to infectious organism [id number] Acute metabolic encephalopathy [id number] Patient Active Problem List   Diagnosis Date Noted   Severe protein-calorie malnutrition ([place]) 08/25/2021   Macrocytic anemia 05/13/2021   Senile dementia ([place]) 05/13/2021   Urinary retention 05/13/2021   Abnormal gait    Falls frequently    Pyuria 05/08/2021   Hard of hearing 05/08/2021   Urinary tract infection without hematuria    AMS (altered mental status) 05/06/2021   ESBL (extended spectrum beta-lactamase) producing bacteria infection 03/09/2021   Altered mental status    Acute encephalopathy [id number]   Acute metabolic encephalopathy 0000000   Atrial fibrillation, chronic ([place]) 01/17/2021   Fall (on)(from) sidewalk curb, initial encounter 01/17/2021   Cognitive impairment 01/17/2021   Depression 01/17/2021   Dyslipidemia 01/17/2021   Glaucoma 01/17/2021   DNR (do  not resuscitate) 01/17/2021   Hyperlipidemia 12/03/2020   Hypertension 12/02/2020   Depression 12/02/2020   GERD (gastroesophageal reflux disease) 12/02/2020   BPH (benign prostatic hyperplasia) 12/02/2020   PCP:  Earmon Phoenix, NP Pharmacy:   [facility] [address] [place]  [address] Phone: [phone] Fax: [phone]     Social Determinants of Health (SDOH) Social History: SDOH Screenings   Depression (PHQ2-9): Medium Risk (12/02/2020)  Tobacco Use: Low Risk  (08/24/2021)   SDOH Interventions:     Readmission Risk Interventions     No data to display

## 2022-09-15 NOTE — Progress Notes (Addendum)
Late note, pt evaluated on 2/14 at 12 pm, note entered on 2/15.   09/14/22 1300  SLP Visit Information  SLP Received On 09/14/22  General Information  HPI Mark Moore is a 89 y.o. male with history h/o dementia, HTN, HLP, chronic atrial fibrillation, BPH, GERD, depression, multiple hospitalizations for recurrent UTI/metabolic encephalopathy sent from SNF memory care ward and concern for altered mental status x 2 days.  He also had a fall yesterday although preceded by confusion.  Patient has had episodes of metabolic encephalopathy in the setting of recurrent UTIs in the past.  Patient is Spanish-speaking and has underlying dementia, appears lethargic/tired and did not want to communicate.  History obtained by speaking to daughter-Anna over the phone.  Daughter states she visits him almost daily.  He apparently is not very active at baseline mostly bedbound, gets out of bed to chair with assistance and walks minimally with a rollator at the SNF with staff assistance.  He is incontinent at baseline but does not have a chronic indwelling Foley.  According to daughter patient has been doing okay with no cough or shortness of breath or other complaints.  He was primarily sent here because of fall yesterday.  Patient has baseline dysphagia and is on pured diet at the facility.  He was previously placed on thickened liquids but was getting dehydrated, hence back on thin liquids.  She is asking if he could be sent back to the facility as soon as possible as he does poorly with hospitalizations.  Type of Study Bedside Swallow Evaluation  Previous Swallow Assessment none in chart  Diet Prior to this Study Dysphagia 1 (pureed);Thin liquids (Level 0)  Temperature Spikes Noted No  Respiratory Status Room air  History of Recent Intubation No  Behavior/Cognition Alert;Cooperative;Pleasant mood  Oral Cavity Assessment WFL  Oral Care Completed by SLP No  Self-Feeding Abilities Total assist  Patient  Positioning Partially reclined  Baseline Vocal Quality Normal  Volitional Cough Cognitively unable to elicit  Volitional Swallow Unable to elicit  Oral Motor/Sensory Function  Overall Oral Motor/Sensory Function WFL  Thin Liquid  Thin Liquid Impaired  Presentation Straw  Pharyngeal  Phase Impairments Multiple swallows;Cough - Immediate  Nectar Thick Liquid  Nectar Thick Liquid NT  Honey Thick Liquid  Honey Thick Liquid NT  Puree  Puree Impaired  Presentation Spoon  Pharyngeal Phase Impairments Multiple swallows  Solid  Solid NT  SLP Assessment  Clinical Impression Statement (ACUTE ONLY) Assessed pt with thin liquids and purees, which is what pt has been consuming PTA per chart. Pt is alert, eager to drink water, a little impulsive with sipping water. Pt has multiple swallows with most boluses and delayed coughing if allowed to drink more than 2-3 sips of water. External pacing reduced incidence of coughing. Pt at risk of aspiration but does not appear severely altered in arousal or mentation to suppose he is very different from his baseline function. He is recommended to continue home diet with aspiraiton precations which were posted at head of bed and discussed with RN. No SLP f/u needed while admitted.  SLP Visit Diagnosis Dysphagia, unspecified (R13.10)  Impact on safety and function Moderate aspiration risk  Other Related Risk Factors History of dysphagia;Cognitive impairment  Swallow Evaluation Recommendations  SLP Diet Recommendations Dysphagia 1 (Puree);Thin liquid  Liquid Administration via Cup;Straw  Medication Administration Whole meds with puree  Supervision Staff to assist with self feeding;Full supervision/cueing for compensatory strategies  Compensations Slow rate;Small sips/bites;Minimize environmental distractions  Postural Changes Seated upright at 90 degrees  Treatment Plan  Oral Care Recommendations Oral care BID  Treatment Recommendations No treatment recommended  at this time  Follow Up Recommendations No SLP follow up  Individuals Consulted  Consulted and Agree with Results and Recommendations Patient unable/family or caregiver not available;RN  SLP Time Calculation  SLP Start Time (ACUTE ONLY) 1215  SLP Stop Time (ACUTE ONLY) 1231  SLP Time Calculation (min) (ACUTE ONLY) 16 min  SLP Evaluations  $ SLP Speech Visit 1 Visit  SLP Evaluations  $BSS Swallow 1 Procedure

## 2022-09-15 NOTE — Evaluation (Signed)
Physical Therapy Evaluation  Patient Details Name: Mark Moore MRN: [id number] DOB: 10/27/1928 Today's Date: 09/15/2022  History of Present Illness  Pt is a 89 y/o male who presents s/p fall at his memory care facility. He was admitted with acute metabolic encephalopathy, CAP. PMH significant for recurrent UTI's,  dementia, a-fib, BPH, depression, glaucoma, HTN, thoracic compression fracture (2022).   Clinical Impression  Pt admitted with above diagnosis. Pt currently with functional limitations due to the deficits listed below (see PT Problem List). At the time of PT eval pt was able to perform bed mobility and transfers with up to +2 max assist. Interpreter utilized but ineffective. Unsure if pt had difficulty hearing interpreter, or if he had difficulty attending to interpreter, but was only intermittently interacting with interpreter throughout session. Recommend return to [facility]. Pt will benefit from skilled PT to increase their independence and safety with mobility to allow discharge to the venue listed below.          Recommendations for follow up therapy are one component of a multi-disciplinary discharge planning process, led by the attending physician.  Recommendations may be updated based on patient status, additional functional criteria and insurance authorization.  Follow Up Recommendations Skilled nursing-short term rehab (<3 hours/day) Can patient physically be transported by private vehicle: No    Assistance Recommended at Discharge Frequent or constant Supervision/Assistance  Patient can return home with the following  Two people to help with walking and/or transfers;Two people to help with bathing/dressing/bathroom;Assistance with feeding;Assist for transportation;Help with stairs or ramp for entrance    Equipment Recommendations Other (comment) (TBD by next venue of care)  Recommendations for Other Services       Functional Status Assessment Patient  has had a recent decline in their functional status and demonstrates the ability to make significant improvements in function in a reasonable and predictable amount of time.     Precautions / Restrictions Precautions Precautions: Fall Precaution Comments: Does not do well with video interpreter. May do better with in-person. Restrictions Weight Bearing Restrictions: No      Mobility  Bed Mobility Overal bed mobility: Needs Assistance Bed Mobility: Supine to Sit, Sit to Supine     Supine to sit: Mod assist, +2 for physical assistance, HOB elevated Sit to supine: Min assist, +2 for physical assistance   General bed mobility comments: Facilitating transition to/from EOB with HHA and guiding trunk as pt not able to understand/attend to the interpreter for instruction.    Transfers Overall transfer level: Needs assistance Equipment used: 2 person hand held assist Transfers: Sit to/from Stand Sit to Stand: Max assist, +2 physical assistance           General transfer comment: HHA provided for balance. Power up to stand was weak and +2 assist provided at gait belt and at UE's for support. Pt able to hold standing balance ~10" before requiring seated rest break. Heavy posterior lean noted.    Ambulation/Gait               General Gait Details: Unable to progress to gait training at this time.  Stairs            Wheelchair Mobility    Modified Rankin (Stroke Patients Only)       Balance Overall balance assessment: Needs assistance   Sitting balance-Leahy Scale: Poor Sitting balance - Comments: Poor initially. After ~2 minutes able to sit unsupported to eat at EOB.   Standing balance support: Bilateral upper extremity  supported, During functional activity Standing balance-Leahy Scale: Zero                               Pertinent Vitals/Pain Pain Assessment Pain Assessment: Faces Faces Pain Scale: No hurt Pain Intervention(s): Monitored  during session    Home Living Family/patient expects to be discharged to:: Skilled nursing facility                   Additional Comments: Memory care unit at [facility]    Prior Function Prior Level of Function : Needs assist             Mobility Comments: Per chart review: He apparently is not very active at baseline mostly bedbound, gets out of bed to chair with assistance and walks minimally with a rollator at the SNF with staff assistance.       Hand Dominance   Dominant Hand: Right    Extremity/Trunk Assessment   Upper Extremity Assessment Upper Extremity Assessment: Generalized weakness    Lower Extremity Assessment Lower Extremity Assessment: Generalized weakness    Cervical / Trunk Assessment Cervical / Trunk Assessment: Normal  Communication   Communication: Interpreter utilized  Cognition Arousal/Alertness: Awake/alert Behavior During Therapy: Restless Overall Cognitive Status: Difficult to assess                                 General Comments: Difficult to utilize interpreter. Pt occasionally with appropriate questions/statements regarding how long will he be here, not preferring food he was brought for lunch, etc. Not answering questions appropriately via interpreter. Interpreter reports tangential speech at times.        General Comments      Exercises     Assessment/Plan    PT Assessment Patient needs continued PT services  PT Problem List Decreased strength;Decreased range of motion;Decreased activity tolerance;Decreased balance;Decreased mobility;Decreased knowledge of use of DME;Decreased safety awareness;Decreased knowledge of precautions;Decreased cognition       PT Treatment Interventions DME instruction;Gait training;Functional mobility training;Therapeutic activities;Therapeutic exercise;Balance training;Patient/family education    PT Goals (Current goals can be found in the Care Plan section)  Acute Rehab  PT Goals Patient Stated Goal: None stated PT Goal Formulation: Patient unable to participate in goal setting Time For Goal Achievement: 09/29/22 Potential to Achieve Goals: Fair    Frequency Min 2X/week     Co-evaluation               AM-PAC PT "6 Clicks" Mobility  Outcome Measure Help needed turning from your back to your side while in a flat bed without using bedrails?: Total Help needed moving from lying on your back to sitting on the side of a flat bed without using bedrails?: Total Help needed moving to and from a bed to a chair (including a wheelchair)?: Total Help needed standing up from a chair using your arms (e.g., wheelchair or bedside chair)?: Total Help needed to walk in hospital room?: Total Help needed climbing 3-5 steps with a railing? : Total 6 Click Score: 6    End of Session Equipment Utilized During Treatment: Gait belt Activity Tolerance: Patient tolerated treatment well;Other (comment) (Somewhat limited by cognition and language barrier) Patient left: in bed;with call bell/phone within reach;with bed alarm set Nurse Communication: Mobility status (Pt had a BM in bed) PT Visit Diagnosis: History of falling (Z91.81);Difficulty in walking, not elsewhere classified (  R26.2)    Time: [id number] PT Time Calculation (min) (ACUTE ONLY): 31 min   Charges:   PT Evaluation $PT Eval Moderate Complexity: 1 Mod          Rolinda Roan, PT, DPT Acute Rehabilitation Services Secure Chat Preferred Office: [phone]   Thelma Comp 09/15/2022, 3:36 PM

## 2022-09-16 DIAGNOSIS — G9341 Metabolic encephalopathy: Secondary | ICD-10-CM | POA: Diagnosis not present

## 2022-09-16 LAB — MAGNESIUM: Magnesium: 1.8 mg/dL (ref 1.7–2.4)

## 2022-09-16 LAB — CBC
HCT: 32.9 % — ABNORMAL LOW (ref 39.0–52.0)
Hemoglobin: 11.1 g/dL — ABNORMAL LOW (ref 13.0–17.0)
MCH: 32.7 pg (ref 26.0–34.0)
MCHC: 33.7 g/dL (ref 30.0–36.0)
MCV: 97.1 fL (ref 80.0–100.0)
Platelets: 200 10*3/uL (ref 150–400)
RBC: 3.39 MIL/uL — ABNORMAL LOW (ref 4.22–5.81)
RDW: 15.3 % (ref 11.5–15.5)
WBC: 7.8 10*3/uL (ref 4.0–10.5)
nRBC: 0 % (ref 0.0–0.2)

## 2022-09-16 LAB — BASIC METABOLIC PANEL
Anion gap: 9 (ref 5–15)
BUN: 16 mg/dL (ref 8–23)
CO2: 26 mmol/L (ref 22–32)
Calcium: 8.6 mg/dL — ABNORMAL LOW (ref 8.9–10.3)
Chloride: 106 mmol/L (ref 98–111)
Creatinine, Ser: 0.65 mg/dL (ref 0.61–1.24)
GFR, Estimated: >60 mL/min (ref >60)
Glucose, Bld: 90 mg/dL (ref 70–99)
Potassium: 3 mmol/L — ABNORMAL LOW (ref 3.5–5.1)
Sodium: 141 mmol/L (ref 135–145)

## 2022-09-16 MED ORDER — AZITHROMYCIN 500 MG PO TABS: 500.0000 mg | ORAL_TABLET | Freq: Every day | ORAL | 0 refills | Status: AC

## 2022-09-16 MED ORDER — CEFDINIR 300 MG PO CAPS: 300.0000 mg | ORAL_CAPSULE | Freq: Two times a day (BID) | ORAL | 0 refills | Status: AC

## 2022-09-16 MED ORDER — QUETIAPINE FUMARATE 25 MG PO TABS: 25.0000 mg | ORAL_TABLET | Freq: Every day | ORAL | 0 refills | Status: AC

## 2022-09-16 MED ORDER — QUETIAPINE FUMARATE 25 MG PO TABS: 25.0000 mg | ORAL_TABLET | Freq: Four times a day (QID) | ORAL | 0 refills | Status: AC | PRN

## 2022-09-16 MED ORDER — POTASSIUM CHLORIDE 20 MEQ PO PACK
40.0000 meq | PACK | ORAL | Status: AC
  Administered 2022-09-16 (×2): 40 meq via ORAL
  Filled 2022-09-16 (×2): qty 2

## 2022-09-16 MED ORDER — MELATONIN 3 MG PO TABS: 3.0000 mg | ORAL_TABLET | Freq: Every evening | ORAL | 0 refills | Status: AC | PRN

## 2022-09-16 MED ORDER — MAGNESIUM SULFATE 2 GM/50ML IV SOLN
2.0000 g | Freq: Once | INTRAVENOUS | Status: AC
  Administered 2022-09-16: 2 g via INTRAVENOUS
  Filled 2022-09-16: qty 50

## 2022-09-16 NOTE — Discharge Summary (Signed)
Physician Discharge Summary  Kerim Matzke [id number] DOB: 1929-03-15 DOA: 09/13/2022  PCP: Earmon Phoenix, NP  Admit date: 09/13/2022 Discharge date: 09/16/2022  Admitted From: [facility] Disposition: [facility] memory care unit SNF with resumption of hospice  Recommendations for Outpatient Follow-up:  Follow up with hospice on time of discharge Continue antibiotics with azithromycin and cefdinir to complete antibiotic course for community-acquired pneumonia Decrease Seroquel to 25 mg p.o. nightly, may use every 6 hours as needed for agitation.  Discontinue lorazepam as these medications may be contributing to his somnolence.  Discharge Condition: Stable, on hospice services outpatient CODE STATUS: DNR Diet recommendation: Comfort feeds as tolerates; dysphagia 1/pured diet with thin liquids, hold meds with pure, assist with feeding  History of present illness:   Mark Moore is a 89 y.o. male with past medical history significant for dementia, HTN, hyperlipidemia, chronic atrial fibrillation, BPH, GERD, depression, multiple hospitalizations for recurrent UTI/metabolic encephalopathy who presented to [facility] ED on 2/13 from SNF memory care unit for concern for altered mental status over the previous 2 days.  SNF staff reported that patient with fall day prior to admission and concern for his confusion related to recurrent UTIs as this has been the case in the past.  Daughter reports he is not very active at baseline, mostly bedbound but gets out of the bed to a chair with assistance and walks minimally with a rollator at SNF with staff assistance.  Patient also with baseline dysphagia and on pured diet at the facility.  Patient currently on hospice and follows with [facility].   In the ED, temperature 98.9 F, HR 88, RR 25, BP 166/98, SpO2 98% on room air.  Sodium 141, potassium 3.1, chloride 102, CO2 27, glucose 100, BUN 16, creatinine 0.83.   AST 23, ALT 14, total bilirubin 0.9.  WBC 12.1, hemoglobin 13.1, platelets 227.  Lactic acid 2.2.  Urinalysis with moderate leukocytes, negative nitrite, few bacteria, greater than 50 WBCs.  CT head/C-spine without contrast with mid left parietal scalp soft tissue swelling without evidence of an acute fracture or acute normality, generalized cerebral atrophy with chronic white matter small vessel ischemic disease, no acute cervical spine fracture or subluxation, marked severity multilevel degenerative changes.  Chest x-ray with mild airspace disease at the right lung base, possible atelectasis versus infiltrate.  Left elbow x-ray with no acute findings.  Blood cultures x 2 and urine culture obtained.  Started on azithromycin and ceftriaxone.  TRH, consulted for admission for further evaluation and management of acute metabolic encephalopathy likely secondary to pneumonia versus UTI.  Hospital course:  Acute metabolic encephalopathy: Resolved Patient presenting from SNF with worsening confusion more than his typical baseline.  History of recurrent UTIs but urine culture with insignificant growth.  Possible underlying commune acquired pneumonia as contributing factor as well as dehydration/lactic acidosis and electrolyte disturbance with hypokalemia.  Appears mental status has improved and likely close to his baseline.   Community-acquired pneumonia Patient is afebrile without leukocytosis.  Oxygenating well on room air.  Chest x-ray with concern for right base infiltrate.  Started on azithromycin and ceftriaxone.  Will continue azithromycin and cefdinir on discharge complete antibiotic course.   UTI, ruled out Patient with history of recurrent UTIs causing metabolic encephalopathy.  Urinalysis with positive leukocytes, negative nitrite, few bacteria and greater than 50 WBCs.  Urine culture with insignificant growth.   Hypokalemia Repleted during hospitalization   Dementia Delirium precautions, Get up  during the day, Encourage  a familiar face to remain present throughout the day, Keep blinds open and lights on during daylight hours, Minimize the use of opioids/benzodiazepines.  Seroquel decreased to 25 mg p.o. nightly, may utilize every 6 hours as needed for agitation.  Discontinued home lorazepam.  Melatonin 3 mg p.o. nightly.  Continue hospice services at facility.   Depression/anxiety Lexapro 15 mg p.o. daily   BPH Finasteride 5 mg p.o. daily   Hx essential hypertension Chronic atrial fibrillation Continue Cardizem 30 mg p.o. every 12 hours, No longer on anticoagulation   Hyperlipidemia No longer on statin outpatient   GERD No longer on PPI.  Discharge Diagnoses:  Principal Problem:   Acute metabolic encephalopathy Active Problems:   Urinary tract infection without hematuria   Falls frequently   Hypertension   Depression   BPH (benign prostatic hyperplasia)   Hyperlipidemia   Atrial fibrillation, chronic (HCC)   Glaucoma   DNR (do not resuscitate)   ESBL (extended spectrum beta-lactamase) producing bacteria infection   Severe protein-calorie malnutrition ([place])    Discharge Instructions  Discharge Instructions     Diet - low sodium heart healthy   Complete by: As directed    Increase activity slowly   Complete by: As directed    No wound care   Complete by: As directed       Allergies as of 09/16/2022       Reactions   Wellbutrin [bupropion] Other (See Comments)   Paranoia  Hallucinations Not documented on [facility]        Medication List     STOP taking these medications    atorvastatin 10 MG tablet Commonly known as: LIPITOR   brimonidine 0.2 % ophthalmic solution Commonly known as: ALPHAGAN   dorzolamide 2 % ophthalmic solution Commonly known as: TRUSOPT   famotidine 20 MG tablet Commonly known as: PEPCID   famotidine 40 MG tablet Commonly known as: PEPCID   LORazepam 2 MG/ML concentrated solution Commonly known as: ATIVAN        TAKE these medications    azithromycin 500 MG tablet Commonly known as: Zithromax Take 1 tablet (500 mg total) by mouth daily for 3 days. Take 1 tablet daily for 3 days.   cefdinir 300 MG capsule Commonly known as: OMNICEF Take 1 capsule (300 mg total) by mouth 2 (two) times daily for 4 days.   diltiazem 30 MG tablet Commonly known as: CARDIZEM Take 1 tablet (30 mg total) by mouth in the morning and at bedtime.   escitalopram 5 MG/5ML solution Commonly known as: LEXAPRO Take 15 mg by mouth daily.   finasteride 5 MG tablet Commonly known as: PROSCAR Take 1 tablet (5 mg total) by mouth daily.   melatonin 3 MG Tabs tablet Take 1 tablet (3 mg total) by mouth at bedtime as needed (insomnia).   QUEtiapine 25 MG tablet Commonly known as: SEROQUEL Take 1 tablet (25 mg total) by mouth at bedtime. 2 entries on MAR: 25 mg twice daily + 25 mg every 6 hours as needed for severe agitation What changed: when to take this   QUEtiapine 25 MG tablet Commonly known as: SEROquel Take 1 tablet (25 mg total) by mouth every 6 (six) hours as needed (aggitation). What changed: You were already taking a medication with the same name, and this prescription was added. Make sure you understand how and when to take each.        Follow-up Information     Earmon Phoenix, NP. Schedule an appointment as soon  as possible for a visit in 1 week(s).   Specialty: Gerontology Contact information: [address] [place] [address] [phone]                Allergies  Allergen Reactions   Wellbutrin [Bupropion] Other (See Comments)    Paranoia  Hallucinations Not documented on MAR    Consultations: None   Procedures/Studies: CT CERVICAL SPINE WO CONTRAST  Result Date: 09/14/2022 CLINICAL DATA:  Status post fall. EXAM: CT CERVICAL SPINE WITHOUT CONTRAST TECHNIQUE: Multidetector CT imaging of the cervical spine was performed without intravenous contrast. Multiplanar CT image  reconstructions were also generated. RADIATION DOSE REDUCTION: This exam was performed according to the departmental dose-optimization program which includes automated exposure control, adjustment of the mA and/or kV according to patient size and/or use of iterative reconstruction technique. COMPARISON:  February 07, 2021 FINDINGS: Alignment: Normal. Skull base and vertebrae: No acute fracture. No primary bone lesion or focal pathologic process. Soft tissues and spinal canal: No prevertebral fluid or swelling. No visible canal hematoma. Disc levels: Moderate to marked severity endplate sclerosis, mild anterior osteophyte formation and moderate severity posterior bony spurring are seen at the levels of C4-C5, C5-C6 and C6-C7. There is marked severity narrowing of the anterior atlantoaxial articulation. Moderate to marked severity intervertebral disc space narrowing is seen at C5-C6 and C6-C7, with mild intervertebral disc space narrowing seen throughout the remainder of the cervical spine. Bilateral marked severity multilevel facet joint hypertrophy is noted. Upper chest: Negative. Other: None. IMPRESSION: 1. No acute cervical spine fracture or subluxation. 2. Marked severity multilevel degenerative changes, as described above. Electronically Signed   By: Virgina Norfolk M.D.   On: 09/14/2022 01:12   CT HEAD WO CONTRAST (5MM)  Result Date: 09/14/2022 CLINICAL DATA:  Status post fall. EXAM: CT HEAD WITHOUT CONTRAST TECHNIQUE: Contiguous axial images were obtained from the base of the skull through the vertex without intravenous contrast. RADIATION DOSE REDUCTION: This exam was performed according to the departmental dose-optimization program which includes automated exposure control, adjustment of the mA and/or kV according to patient size and/or use of iterative reconstruction technique. COMPARISON:  None Available. FINDINGS: Brain: There is mild cerebral atrophy with widening of the extra-axial spaces and  ventricular dilatation. There are areas of decreased attenuation within the white matter tracts of the supratentorial brain, consistent with microvascular disease changes. Vascular: There is marked severity bilateral cavernous carotid artery calcification. Skull: Normal. Negative for fracture or focal lesion. Sinuses/Orbits: There is mild sphenoid sinus and mild posterior right ethmoid sinus mucosal thickening. Other: Mild left parietal scalp soft tissue swelling is seen. IMPRESSION: 1. Mild left parietal scalp soft tissue swelling without evidence for an acute fracture or acute intracranial abnormality. 2. Generalized cerebral atrophy with chronic white matter small vessel ischemic changes. 3. Mild sphenoid sinus and posterior right ethmoid sinus disease. Electronically Signed   By: Virgina Norfolk M.D.   On: 09/14/2022 01:10   DG Elbow Complete Left  Result Date: 09/14/2022 CLINICAL DATA:  Fall, altered mental status. EXAM: LEFT ELBOW - COMPLETE 3+ VIEW COMPARISON:  None Available. FINDINGS: There is no evidence of fracture, dislocation, or joint effusion. There is no evidence of arthropathy or other focal bone abnormality. IV is noted in the soft tissues of the midforearm. Soft tissues are unremarkable. IMPRESSION: No acute fracture or dislocation. Electronically Signed   By: Brett Fairy M.D.   On: 09/14/2022 01:00   DG Chest Port 1 View  Result Date: 09/14/2022 CLINICAL DATA:  Possible sepsis.  Altered mental status. EXAM: PORTABLE CHEST 1 VIEW COMPARISON:  01/17/2021. FINDINGS: Heart is enlarged and the mediastinal contour is within normal limits. There is atherosclerotic calcifications of the aorta. Mild airspace disease is present at the right lung base. No effusion or pneumothorax. No acute osseous abnormality. IMPRESSION: 1. Mild airspace disease at the right lung base, possible atelectasis or infiltrate. 2. Cardiomegaly. Electronically Signed   By: Brett Fairy M.D.   On: 09/14/2022 00:59      Subjective: Patient seen examined bedside, resting comfortably.  Alert.  Appears that he is at his normal baseline.  Due to his underlying dementia, unable to assist with interpretation with video interpreter.  RN present at bedside.  Discharging back to SNF today.  No acute concerns overnight per nursing staff.  Discharge Exam: Vitals:   09/16/22 0430 09/16/22 0829  BP: (!) 156/79 (!) 164/83  Pulse: 65 77  Resp: 16 18  Temp: 97.7 F (36.5 C) 97.7 F (36.5 C)  SpO2: 99% 97%   Vitals:   09/15/22 2038 09/16/22 0430 09/16/22 0500 09/16/22 0829  BP: (!) 151/77 (!) 156/79  (!) 164/83  Pulse: 74 65  77  Resp:  16  18  Temp: 98.6 F (37 C) 97.7 F (36.5 C)  97.7 F (36.5 C)  TempSrc:  Oral  Oral  SpO2: 97% 99%  97%  Weight:   55.6 kg     Physical Exam: GEN: NAD, alert, pleasantly confused, chronically ill/elderly in appearance HEENT: NCAT, PERRL, EOMI, sclera clear, MMM PULM: CTAB w/o wheezes/crackles, normal respiratory effort, on room air CV: RRR w/o M/G/R GI: abd soft, NTND, NABS, no R/G/M MSK: no peripheral edema, moves all extremities independently Integumentary: Abrasion noted to left temporal region, otherwise no other concerning rashes/lesions/wounds noted on exposed skin surfaces.     The results of significant diagnostics from this hospitalization (including imaging, microbiology, ancillary and laboratory) are listed below for reference.     Microbiology: Recent Results (from the past 240 hour(s))  Blood Culture (routine x 2)     Status: None (Preliminary result)   Collection Time: 09/13/22 11:33 PM   Specimen: BLOOD  Result Value Ref Range Status   Specimen Description BLOOD BLOOD LEFT WRIST  Final   Special Requests   Final    BOTTLES DRAWN AEROBIC AND ANAEROBIC Blood Culture adequate volume   Culture   Final    NO GROWTH 2 DAYS Performed at [facility], [address], [place], [place] [address]    Report Status PENDING  Incomplete  Blood  Culture (routine x 2)     Status: None (Preliminary result)   Collection Time: 09/13/22 11:38 PM   Specimen: BLOOD  Result Value Ref Range Status   Specimen Description BLOOD RIGHT ANTECUBITAL  Final   Special Requests   Final    BOTTLES DRAWN AEROBIC AND ANAEROBIC Blood Culture adequate volume   Culture   Final    NO GROWTH 2 DAYS Performed at [facility], [place] [address] [place], [place] [address]    Report Status PENDING  Incomplete  Urine Culture     Status: Abnormal   Collection Time: 09/13/22 11:45 PM   Specimen: Urine, Random  Result Value Ref Range Status   Specimen Description URINE, RANDOM  Final   Special Requests NONE Reflexed from [phone]  Final   Culture (A)  Final    <10,000 COLONIES/mL INSIGNIFICANT GROWTH Performed at [facility], [place]  [address], [place], [place] [address]    Report Status 09/14/2022 FINAL  Final     Labs: BNP (last 3 results) No results for input(s): "BNP" in the last 8760 hours. Basic Metabolic Panel: Recent Labs  Lab 09/13/22 2310 09/14/22 0405 09/15/22 0408 09/15/22 1417 09/16/22 0424  NA 141 142 141  --  141  K 3.1* 3.2* 2.5* 3.5 3.0*  CL 102 99 103  --  106  CO2 27 30 29  $ --  26  GLUCOSE 100* 98 86  --  90  BUN 16 14 12  $ --  16  CREATININE 0.83 0.77 0.68  --  0.65  CALCIUM 9.4 8.8* 8.4*  --  8.6*  MG  --  1.8 1.9  --  1.8   Liver Function Tests: Recent Labs  Lab 09/13/22 2310 09/14/22 0405  AST 23 25  ALT 14 13  ALKPHOS 84 69  BILITOT 0.9 0.4  PROT 7.0 5.8*  ALBUMIN 3.7 3.1*   No results for input(s): "LIPASE", "AMYLASE" in the last 168 hours. No results for input(s): "AMMONIA" in the last 168 hours. CBC: Recent Labs  Lab 09/13/22 2310 09/14/22 0405 09/15/22 0408 09/16/22 0424  WBC 12.1* 11.2* 6.6 7.8  NEUTROABS 10.3* 9.0*  --   --   HGB 13.1 11.4* 11.8* 11.1*  HCT 38.6* 34.5* 36.2* 32.9*  MCV 95.5 98.3 97.3 97.1  PLT 227 206 216 200   Cardiac Enzymes: No results for input(s): "CKTOTAL",  "CKMB", "CKMBINDEX", "TROPONINI" in the last 168 hours. BNP: Invalid input(s): "POCBNP" CBG: No results for input(s): "GLUCAP" in the last 168 hours. D-Dimer No results for input(s): "DDIMER" in the last 72 hours. Hgb A1c No results for input(s): "HGBA1C" in the last 72 hours. Lipid Profile No results for input(s): "CHOL", "HDL", "LDLCALC", "TRIG", "CHOLHDL", "LDLDIRECT" in the last 72 hours. Thyroid function studies No results for input(s): "TSH", "T4TOTAL", "T3FREE", "THYROIDAB" in the last 72 hours.  Invalid input(s): "FREET3" Anemia work up No results for input(s): "VITAMINB12", "FOLATE", "FERRITIN", "TIBC", "IRON", "RETICCTPCT" in the last 72 hours. Urinalysis    Component Value Date/Time   COLORURINE STRAW (A) 09/13/2022 2345   APPEARANCEUR CLEAR 09/13/2022 2345   LABSPEC 1.005 09/13/2022 2345   PHURINE 8.0 09/13/2022 2345   GLUCOSEU NEGATIVE 09/13/2022 2345   HGBUR NEGATIVE 09/13/2022 2345   BILIRUBINUR NEGATIVE 09/13/2022 2345   KETONESUR NEGATIVE 09/13/2022 2345   PROTEINUR 30 (A) 09/13/2022 2345   NITRITE NEGATIVE 09/13/2022 2345   LEUKOCYTESUR MODERATE (A) 09/13/2022 2345   Sepsis Labs Recent Labs  Lab 09/13/22 2310 09/14/22 0405 09/15/22 0408 09/16/22 0424  WBC 12.1* 11.2* 6.6 7.8   Microbiology Recent Results (from the past 240 hour(s))  Blood Culture (routine x 2)     Status: None (Preliminary result)   Collection Time: 09/13/22 11:33 PM   Specimen: BLOOD  Result Value Ref Range Status   Specimen Description BLOOD BLOOD LEFT WRIST  Final   Special Requests   Final    BOTTLES DRAWN AEROBIC AND ANAEROBIC Blood Culture adequate volume   Culture   Final    NO GROWTH 2 DAYS Performed at [facility], [place] [address], [place], [place] [address]    Report Status PENDING  Incomplete  Blood Culture (routine x 2)     Status: None (Preliminary result)   Collection Time: 09/13/22 11:38 PM   Specimen: BLOOD  Result Value Ref Range Status   Specimen  Description BLOOD RIGHT ANTECUBITAL  Final   Special  Requests   Final    BOTTLES DRAWN AEROBIC AND ANAEROBIC Blood Culture adequate volume   Culture   Final    NO GROWTH 2 DAYS Performed at [facility], [place] [address] [place], [place] [address]    Report Status PENDING  Incomplete  Urine Culture     Status: Abnormal   Collection Time: 09/13/22 11:45 PM   Specimen: Urine, Random  Result Value Ref Range Status   Specimen Description URINE, RANDOM  Final   Special Requests NONE Reflexed from [phone]  Final   Culture (A)  Final    <10,000 COLONIES/mL INSIGNIFICANT GROWTH Performed at [facility], [place] [address], [place], [place] [address]    Report Status 09/14/2022 FINAL  Final     Time coordinating discharge: Over 30 minutes  SIGNED:   Anmol Fleck J [place] DO  [facility] 09/16/2022, 10:15 AM

## 2022-09-16 NOTE — NC FL2 (Signed)
[place] LEVEL OF CARE FORM     IDENTIFICATION  Patient Name: Mark Moore Birthdate: 04-22-1929 Sex: male Admission Date (Current Location): 09/13/2022  [facility] and [place] Number:  Herbalist and Address:  [facility] [place], [place] [address] [place], [place] [address]      Provider Number: [id number]  Attending Physician Name and Address:  [place] Eric J, DO  Relative Name and Phone Number:  Kathlene November [phone]    Current Level of Care: Hospital Recommended Level of Care: [place], Memory Care Prior Approval Number:    Date Approved/Denied:   PASRR Number:    Discharge Plan: Other (Comment) ([place], [facility])    Current Diagnoses: Patient Active Problem List   Diagnosis Date Noted   Severe protein-calorie malnutrition ([place]) 08/25/2021   Macrocytic anemia 05/13/2021   Senile dementia ([place]) 05/13/2021   Urinary retention 05/13/2021   Abnormal gait    Falls frequently    Pyuria 05/08/2021   Hard of hearing 05/08/2021   Urinary tract infection without hematuria    AMS (altered mental status) 05/06/2021   ESBL (extended spectrum beta-lactamase) producing bacteria infection 03/09/2021   Altered mental status    Acute encephalopathy [id number]   Acute metabolic encephalopathy 0000000   Atrial fibrillation, chronic ([place]) 01/17/2021   Fall (on)(from) sidewalk curb, initial encounter 01/17/2021   Cognitive impairment 01/17/2021   Depression 01/17/2021   Dyslipidemia 01/17/2021   Glaucoma 01/17/2021   DNR (do not resuscitate) 01/17/2021   Hyperlipidemia 12/03/2020   Hypertension 12/02/2020   Depression 12/02/2020   GERD (gastroesophageal reflux disease) 12/02/2020   BPH (benign prostatic hyperplasia) 12/02/2020    Orientation RESPIRATION BLADDER Height & Weight     Self  Normal Incontinent, External catheter Weight: 122 lb 9.2 oz (55.6 kg) Height:     BEHAVIORAL SYMPTOMS/MOOD  NEUROLOGICAL BOWEL NUTRITION STATUS      Continent Diet (Comfort feeds as tolerates; dysphagia 1/pured diet with thin liquids, hold meds with pure, assist with feeding)  AMBULATORY STATUS COMMUNICATION OF NEEDS Skin   Extensive Assist Verbally Skin abrasions (Head and shoulder abrasions)                       Personal Care Assistance Level of Assistance  Bathing, Feeding, Dressing Bathing Assistance: Maximum assistance Feeding assistance: Limited assistance Dressing Assistance: Maximum assistance     Functional Limitations Info  Sight, Hearing, Speech Sight Info: Adequate Hearing Info: Impaired Speech Info: Adequate    SPECIAL CARE FACTORS FREQUENCY                       Contractures Contractures Info: Not present    Additional Factors Info  Code Status, Allergies, Psychotropic Code Status Info: DNR Allergies Info: Wellbutrin Psychotropic Info: Esictalopram, Quetiapine         Current Medications (09/16/2022):  This is the current hospital active medication list Current Facility-Administered Medications  Medication Dose Route Frequency Provider Last Rate Last Admin   acetaminophen (TYLENOL) tablet 650 mg  650 mg Oral Q6H PRN Guilford Shi, MD       Or   acetaminophen (TYLENOL) suppository 650 mg  650 mg Rectal Q6H PRN Kamineni, Neelima, MD       albuterol (PROVENTIL) (2.5 MG/3ML) 0.083% nebulizer solution 2.5 mg  2.5 mg Nebulization Q2H PRN Kamineni, Neelima, MD       brimonidine (ALPHAGAN) 0.2 % ophthalmic solution 1 drop  1  drop Both Eyes TID Guilford Shi, MD   1 drop at 09/16/22 0852   cefTRIAXone (ROCEPHIN) 1 g in sodium chloride 0.9 % 100 mL IVPB  1 g Intravenous Q24H Guilford Shi, MD 200 mL/hr at 09/15/22 2220 1 g at 09/15/22 2220   diltiazem (CARDIZEM) tablet 30 mg  30 mg Oral Q12H Kamineni, Neelima, MD   30 mg at 09/16/22 0850   dorzolamide (TRUSOPT) 2 % ophthalmic solution 1 drop  1 drop Both Eyes BID Guilford Shi, MD   1 drop at  09/16/22 0858   escitalopram (LEXAPRO) tablet 15 mg  15 mg Oral Daily Guilford Shi, MD   15 mg at 09/16/22 0851   finasteride (PROSCAR) tablet 5 mg  5 mg Oral Daily Guilford Shi, MD   5 mg at 09/16/22 0850   hydrALAZINE (APRESOLINE) injection 5 mg  5 mg Intravenous Q6H PRN Guilford Shi, MD   5 mg at 09/15/22 [id number]   melatonin tablet 3 mg  3 mg Oral QHS PRN Guilford Shi, MD       ondansetron (ZOFRAN) tablet 4 mg  4 mg Oral Q6H PRN Guilford Shi, MD       Or   ondansetron (ZOFRAN) injection 4 mg  4 mg Intravenous Q6H PRN Kamineni, Neelima, MD       pantoprazole (PROTONIX) EC tablet 20 mg  20 mg Oral Daily Guilford Shi, MD   20 mg at 09/16/22 0850   polyethylene glycol (MIRALAX / GLYCOLAX) packet 17 g  17 g Oral Daily Guilford Shi, MD   17 g at 09/16/22 0851   QUEtiapine (SEROQUEL) tablet 25 mg  25 mg Oral QHS [place] Eric J, DO   25 mg at 09/15/22 2220     Discharge Medications: azithromycin 500 MG tablet Commonly known as: Zithromax Take 1 tablet (500 mg total) by mouth daily for 3 days. Take 1 tablet daily for 3 days.    cefdinir 300 MG capsule Commonly known as: OMNICEF Take 1 capsule (300 mg total) by mouth 2 (two) times daily for 4 days.    diltiazem 30 MG tablet Commonly known as: CARDIZEM Take 1 tablet (30 mg total) by mouth in the morning and at bedtime.    escitalopram 5 MG/5ML solution Commonly known as: LEXAPRO Take 15 mg by mouth daily.    finasteride 5 MG tablet Commonly known as: PROSCAR Take 1 tablet (5 mg total) by mouth daily.    melatonin 3 MG Tabs tablet Take 1 tablet (3 mg total) by mouth at bedtime as needed (insomnia).    QUEtiapine 25 MG tablet Commonly known as: SEROQUEL Take 1 tablet (25 mg total) by mouth at bedtime. 2 entries on MAR: 25 mg twice daily + 25 mg every 6 hours as needed for severe agitation What changed: when to take this    QUEtiapine 25 MG tablet Commonly known as: SEROquel Take 1 tablet (25 mg  total) by mouth every 6 (six) hours as needed (aggitation). What changed: You were already taking a medication with the same name, and this prescription was added. Make sure you understand how and when to take each.     Relevant Imaging Results:  Relevant Lab Results:   Additional Information SS# [id number]  Coralee Pesa, [place]

## 2022-09-16 NOTE — TOC Transition Note (Addendum)
Transition of Care [facility]) - CM/SW Discharge Note   Patient Details  Name: Mark Moore MRN: [id number] Date of Birth: 1929/06/07  [facility]) CM/SW Contact:  Coralee Pesa, [place] Phone Number: 09/16/2022, 12:32 PM   Clinical Narrative:     Pt to be transported to [facility]/ [facility] via [place]. Nurse to call report to [phone].  Final next level of care: Assisted Living Barriers to Discharge: Barriers Resolved   Patient Goals and CMS Choice CMS Medicare.gov Compare Post Acute Care list provided to:: Patient Represenative (must comment) Choice offered to / list presented to : Adult Children  Discharge Placement                Patient [facility] bed at: [facility] Patient to be transferred to facility by: [place] Name of family member notified: Ana Patient and family notified of of transfer: 09/16/22  Discharge Plan and Services Additional resources added to the After Visit Summary for   In-house Referral: Clinical Social Work   Post Acute Care Choice: Resumption of Svcs/PTA Provider                               Social Determinants of Health (SDOH) Interventions SDOH Screenings   Depression (PHQ2-9): Medium Risk (12/02/2020)  Tobacco Use: Low Risk  (08/24/2021)     Readmission Risk Interventions     No data to display

## 2022-09-19 LAB — CULTURE, BLOOD (ROUTINE X 2)
Culture: NO GROWTH
Culture: NO GROWTH
Special Requests: ADEQUATE
Special Requests: ADEQUATE

## 2022-10-09 IMAGING — CT CT HEAD W/O CM
3 series · 15 of 47 positions shown, 18 images · non-contrast
Comparison: 02/07/2021

CLINICAL DATA: Headache, intracranial hemorrhage suspected Delirium

EXAM:
CT HEAD WITHOUT CONTRAST
TECHNIQUE: Contiguous axial images were obtained from the base of the skull
through the vertex without intravenous contrast.

[Series 3: head 5.0 h30s · axial · 0.44mm/px · z∈[-134,+1]mm · 9 of 33 slices shown, 12 images]
[im 3/33  brain]
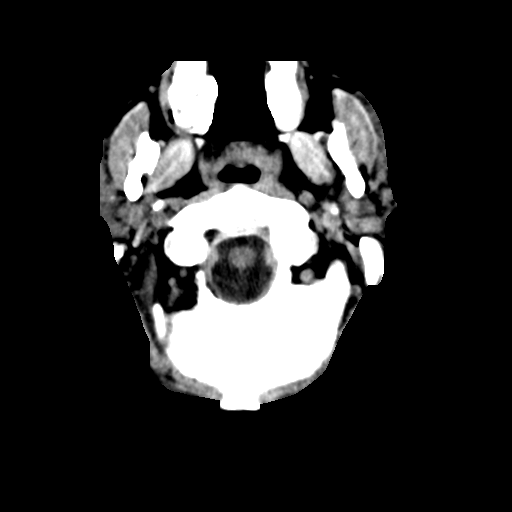
[im 3/33  bone]
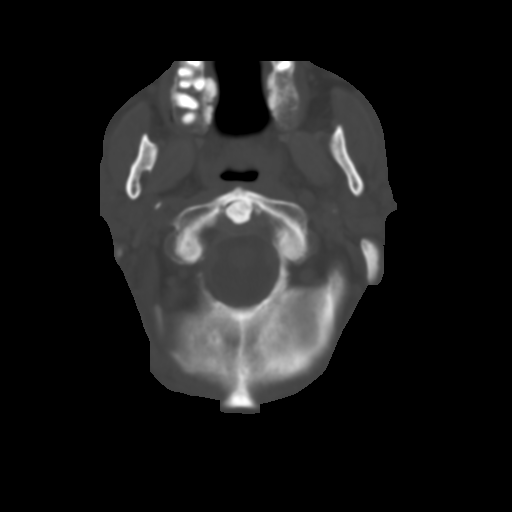
[im 6/33  brain]
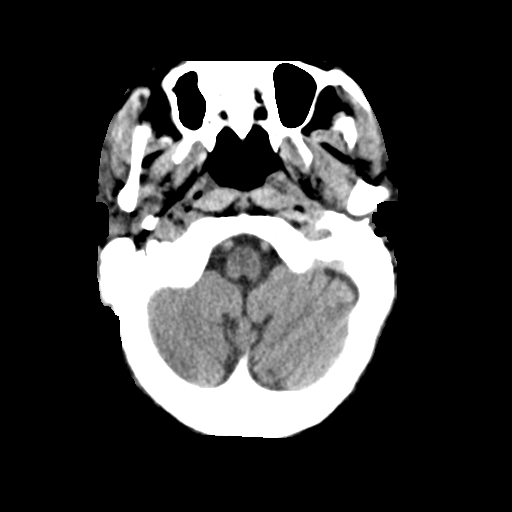
[im 9/33  brain]
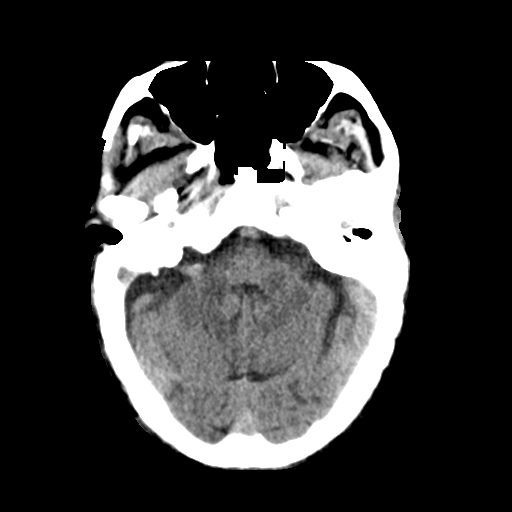
[im 13/33  brain]
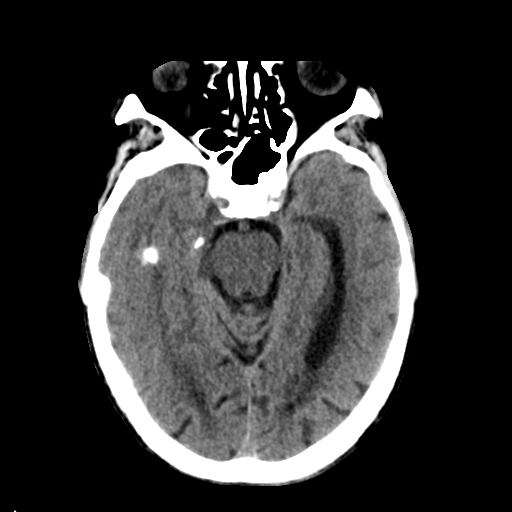
[im 17/33  brain]
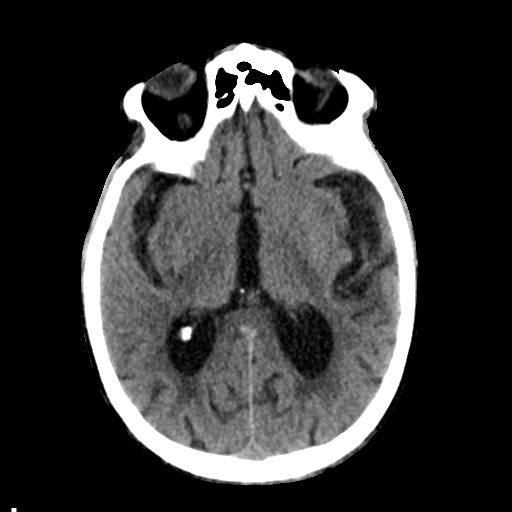
[im 17/33  bone]
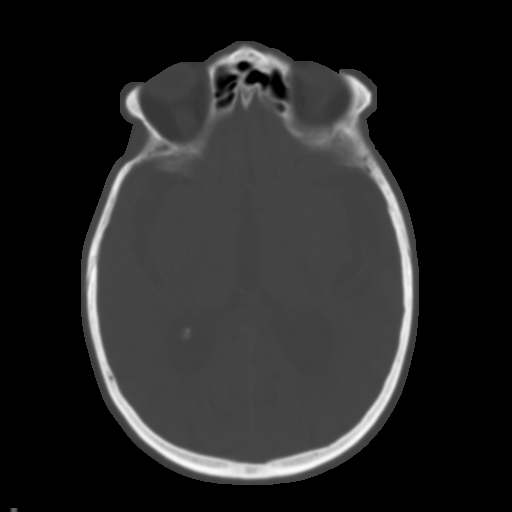
[im 20/33  brain]
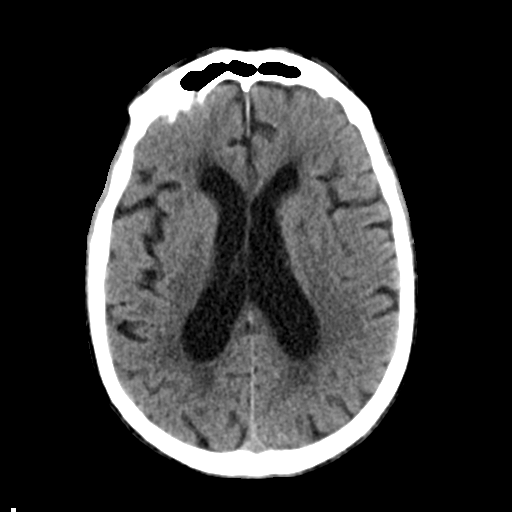
[im 24/33  brain]
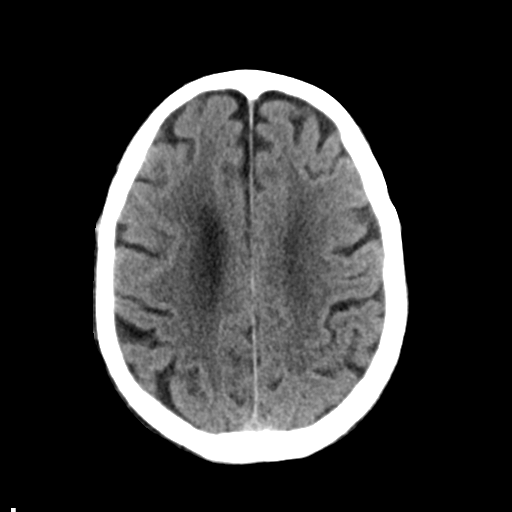
[im 27/33  brain]
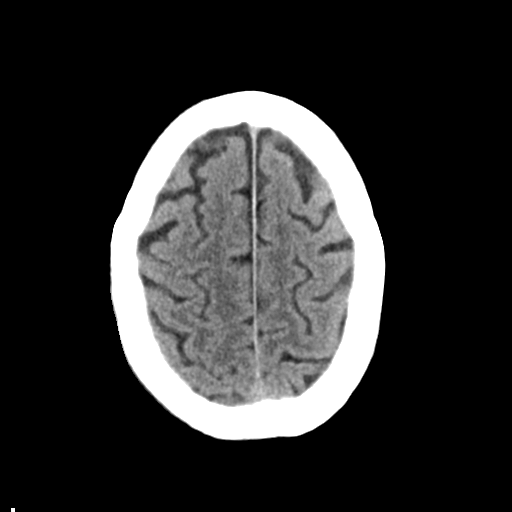
[im 30/33  brain]
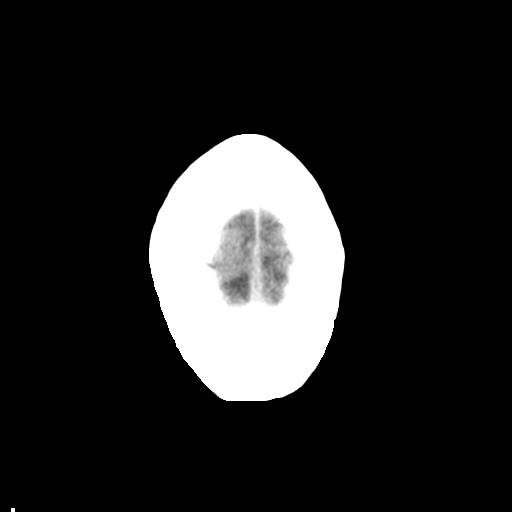
[im 30/33  bone]
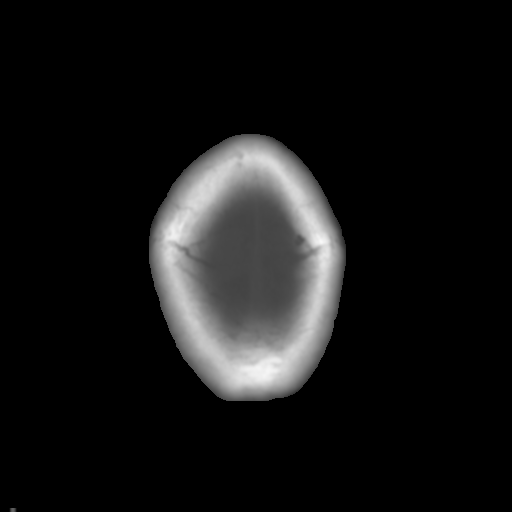

[Series 5: head 3.0 mpr cor · coronal · 0.33mm/px · 3 of 69 slices shown]
[im 23/69  brain]
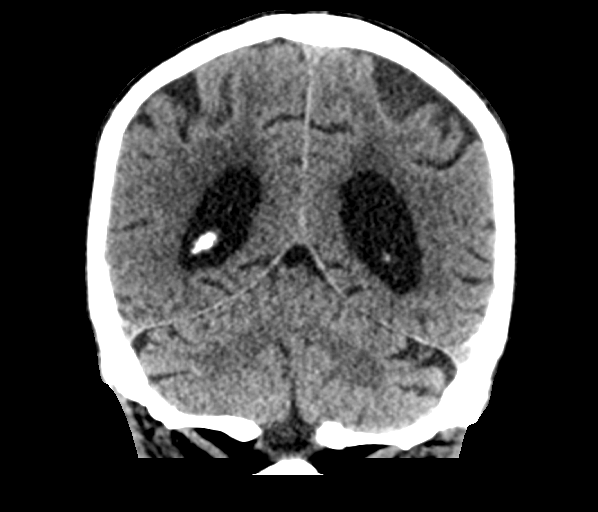
[im 31/69  brain]
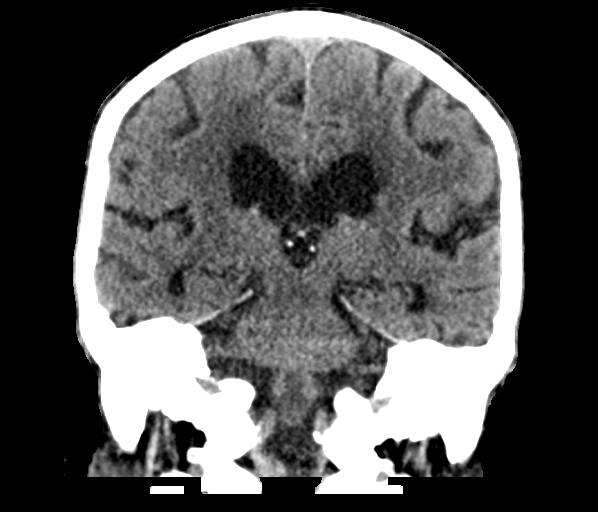
[im 38/69  brain]
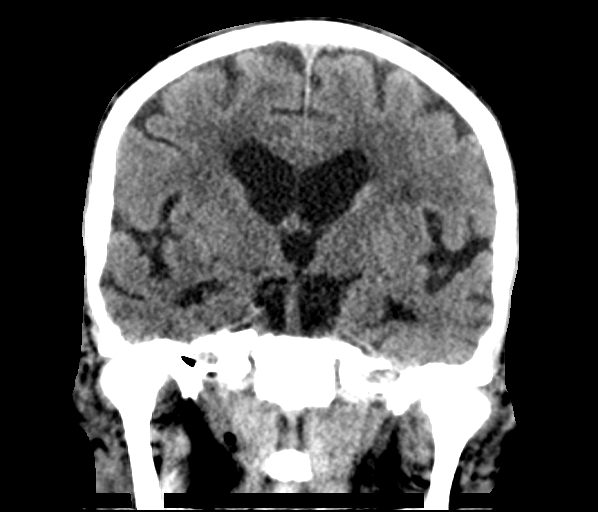

[Series 6: head 3.0 mpr sag · sagittal · 0.33mm/px · 3 of 57 slices shown]
[im 19/57  brain]
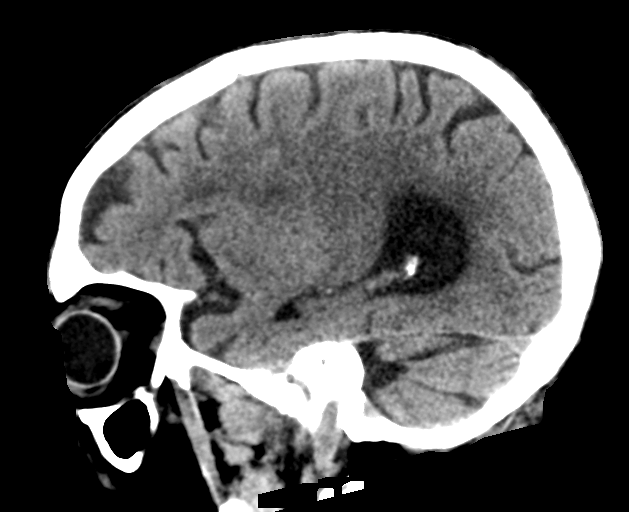
[im 29/57  brain]
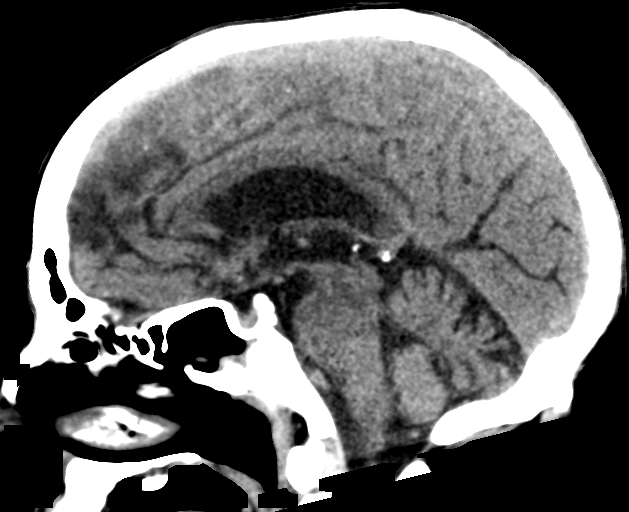
[im 38/57  brain]
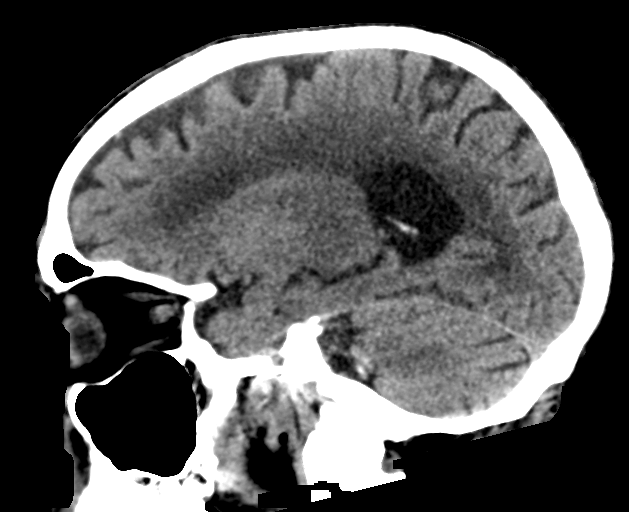

[15 of 47 positions shown; findings below may reference images not displayed]

FINDINGS: Brain: No evidence of acute infarction, hemorrhage, hydrocephalus,
extra-axial collection or mass lesion/mass effect. Moderate
low-density changes within the periventricular and subcortical white
matter compatible with chronic microvascular ischemic change. Mild
diffuse cerebral volume loss.

Vascular: Atherosclerotic calcifications involving the large vessels
of the skull base. No unexpected hyperdense vessel.

Skull: Normal. Negative for fracture or focal lesion.

Sinuses/Orbits: Left mastoid effusion. Mucosal thickening within the
sphenoid sinuses.

Other: Incidentally noted densely calcified central maxillary torus
measuring 1.5 x 1.3 cm.
IMPRESSION: 1. No acute intracranial findings.
2. Chronic microvascular ischemic change and cerebral volume loss.
3. Left mastoid effusion.

## 2022-10-09 IMAGING — US US PELVIS LIMITED
1 series · 14 of 15 positions shown · non-contrast
Comparison: CT 01/17/2021

CLINICAL DATA: Possible left inguinal hernia

EXAM:
LIMITED ULTRASOUND OF PELVIS
TECHNIQUE: Limited transabdominal ultrasound examination of the pelvis was
performed.

[Series 1: us pelvis limited (transabdominal only) · 15 acquisitions, 14 frames shown]
[im 1/15]
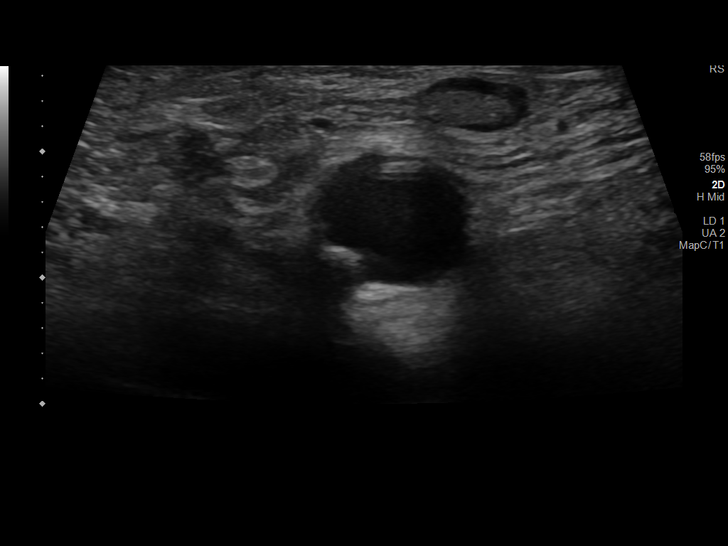
[im 2/15]
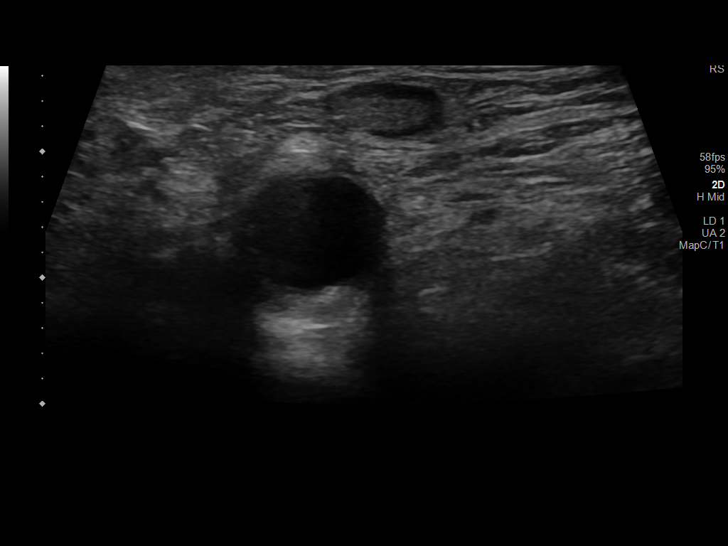
[im 3/15]
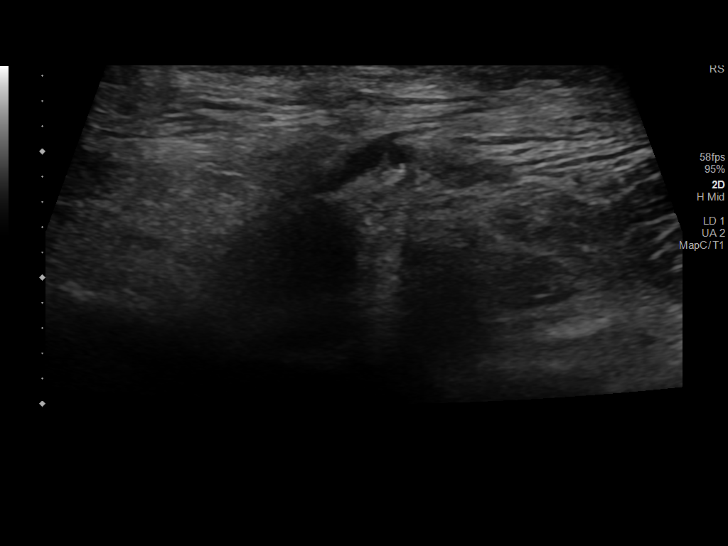
[im 4/15]
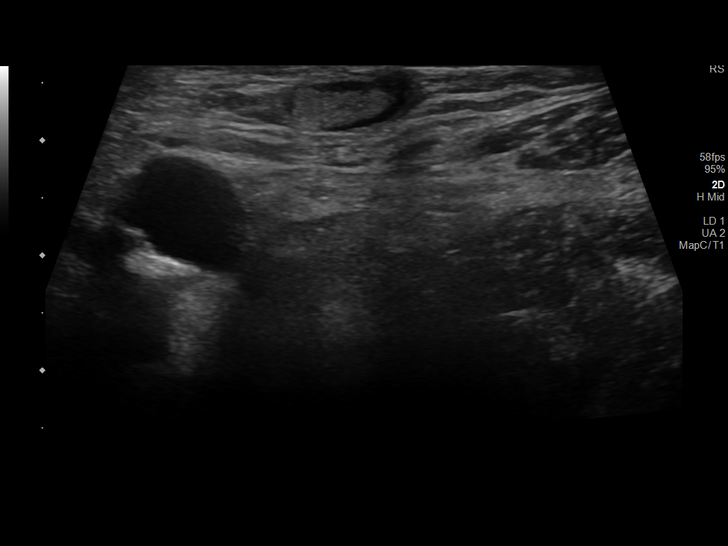
[im 5/15]
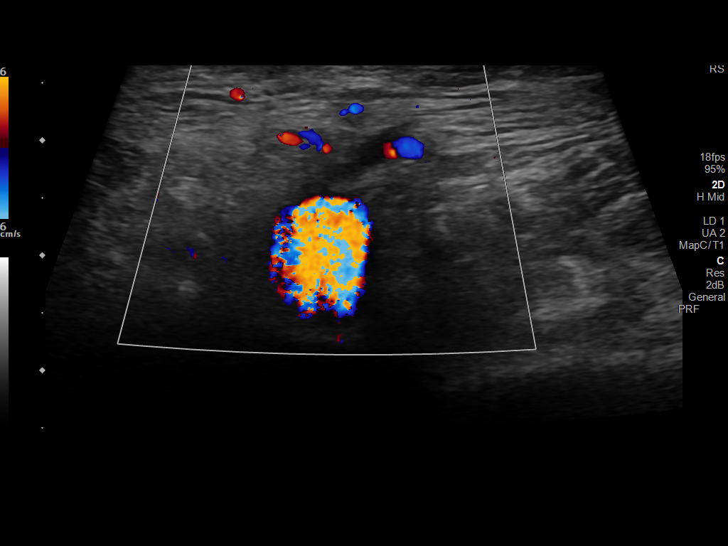
[im 6/15]
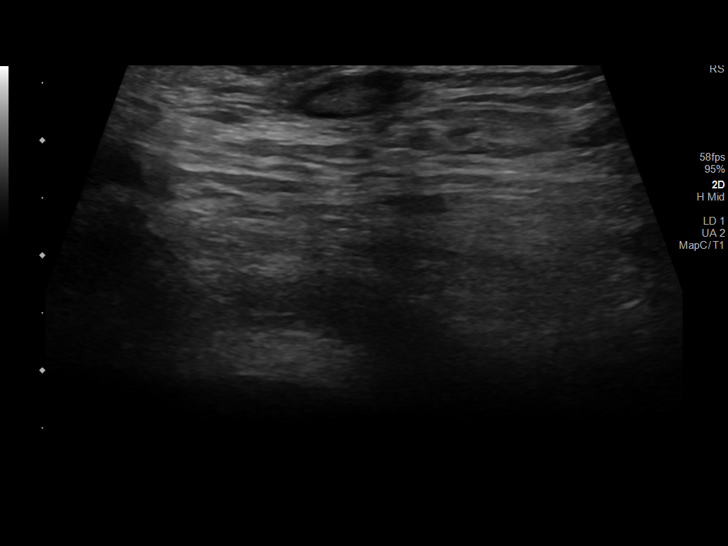
[im 7/15]
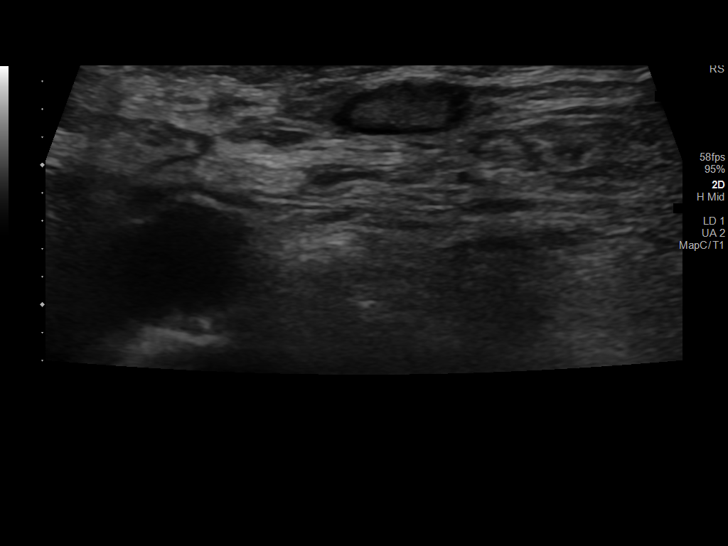
[im 9/15]
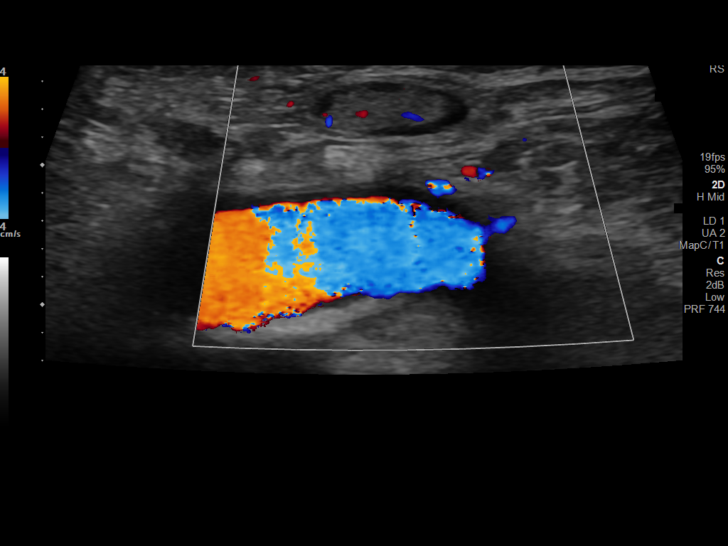
[im 10/15]
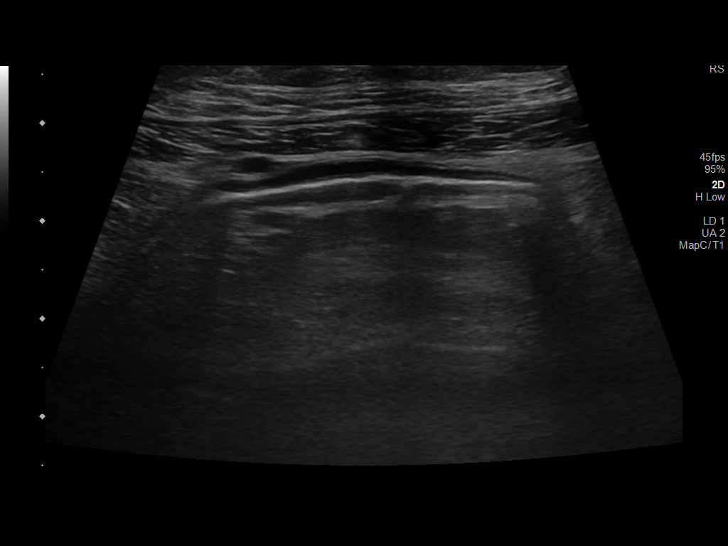
[im 11/15]
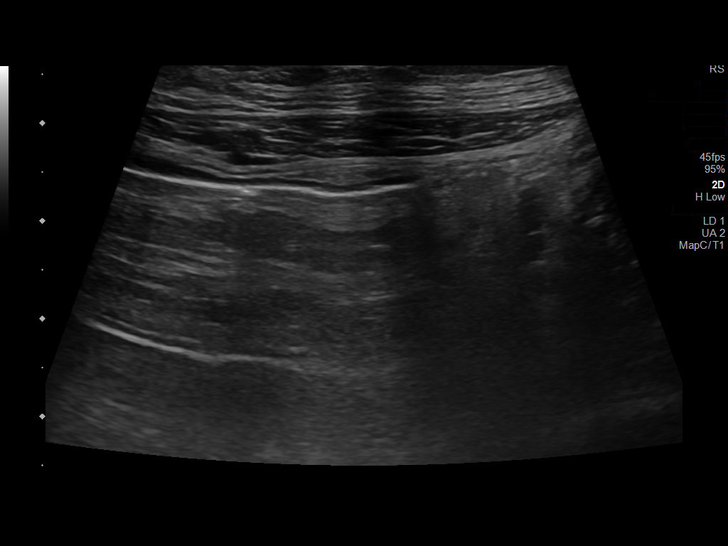
[im 12/15]
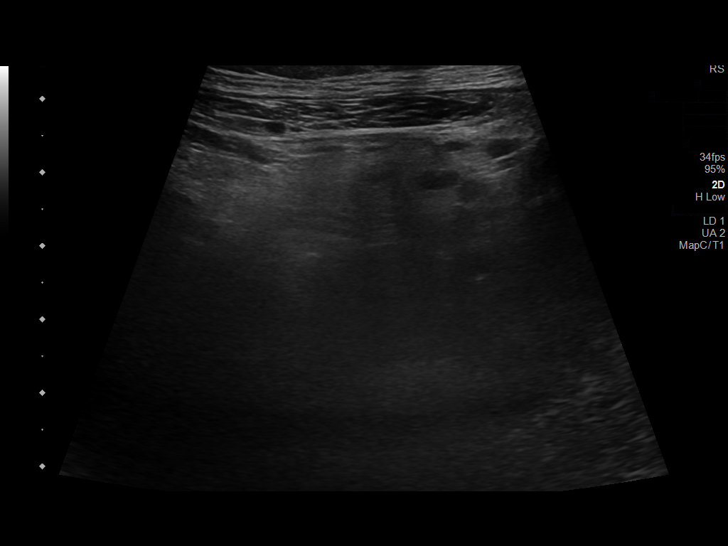
[im 13/15]
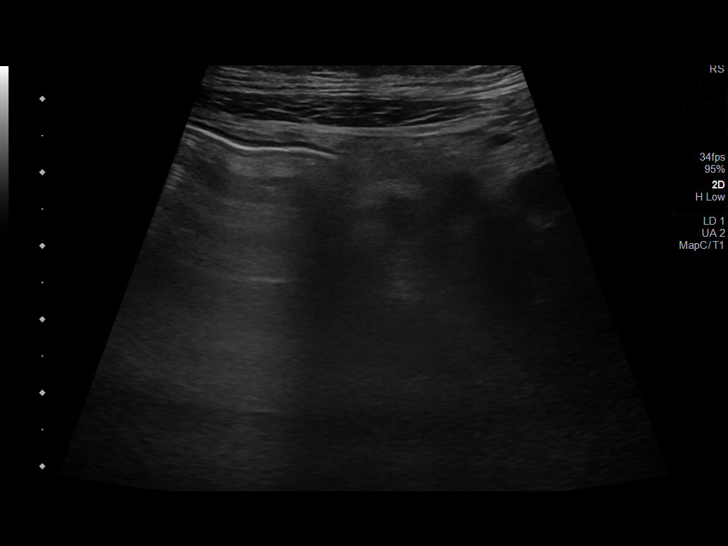
[im 14/15]
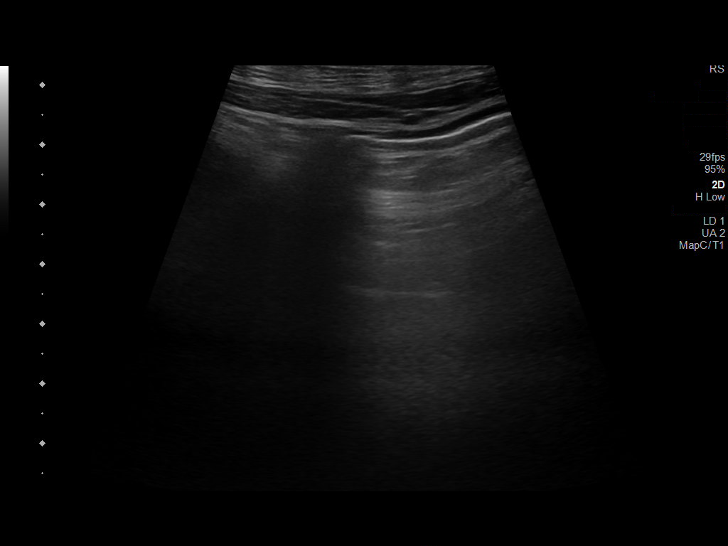
[im 15/15]
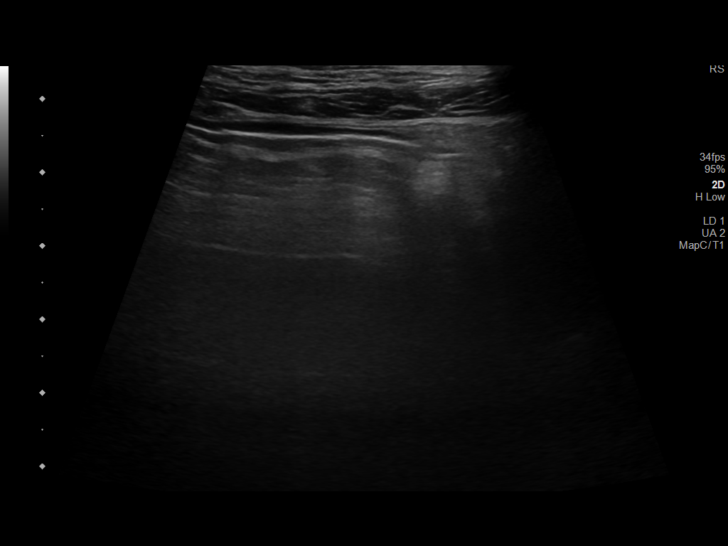

[14 of 15 positions shown; findings below may reference images not displayed]

FINDINGS: Targeted ultrasound of left groin performed at rest and with
Valsalva. No cyst is visualized. Benign appearing lymph node in the
left groin. Negative for bowel containing hernia.
IMPRESSION: Negative for bowel containing left groin hernia. Small benign
appearing lymph node
